# Patient Record
Sex: Male | Born: 1945
Health system: Southern US, Community
[De-identification: ages and names within clinical notes are randomized; demographics above are authoritative.]

## PROBLEM LIST (undated history)

## (undated) DIAGNOSIS — I10 Essential (primary) hypertension: Secondary | ICD-10-CM

## (undated) DIAGNOSIS — R0600 Dyspnea, unspecified: Secondary | ICD-10-CM

## (undated) DIAGNOSIS — N183 Chronic kidney disease, stage 3 unspecified: Secondary | ICD-10-CM

## (undated) DIAGNOSIS — E7800 Pure hypercholesterolemia, unspecified: Secondary | ICD-10-CM

## (undated) DIAGNOSIS — E349 Endocrine disorder, unspecified: Secondary | ICD-10-CM

## (undated) DIAGNOSIS — M109 Gout, unspecified: Secondary | ICD-10-CM

## (undated) DIAGNOSIS — G63 Polyneuropathy in diseases classified elsewhere: Secondary | ICD-10-CM

---

## 2012-01-25 ENCOUNTER — Emergency Department (INDEPENDENT_AMBULATORY_CARE_PROVIDER_SITE_OTHER)
Admission: EM | Admit: 2012-01-25 | Discharge: 2012-01-25 | Disposition: A | Discharge status: Home / Self Care | Payer: Self-pay | Source: Home / Self Care | Attending: Family Medicine | Admitting: Family Medicine

## 2012-01-25 ENCOUNTER — Encounter (HOSPITAL_COMMUNITY): Payer: Self-pay | Admitting: Emergency Medicine

## 2012-01-25 DIAGNOSIS — M109 Gout, unspecified: Secondary | ICD-10-CM

## 2012-01-25 HISTORY — DX: Gout, unspecified: M10.9

## 2012-01-25 HISTORY — DX: Essential (primary) hypertension: I10

## 2012-01-25 LAB — POCT I-STAT, CHEM 8
BUN: 32 mg/dL — ABNORMAL HIGH (ref 6–23)
Calcium, Ion: 1.21 mmol/L (ref 1.13–1.30)
Chloride: 104 mEq/L (ref 96–112)
Potassium: 4.9 mEq/L (ref 3.5–5.1)

## 2012-01-25 LAB — URIC ACID: Uric Acid, Serum: 7.6 mg/dL (ref 4.0–7.8)

## 2012-01-25 MED ORDER — COLCHICINE 0.6 MG PO TABS: 0.6000 mg | ORAL_TABLET | Freq: Two times a day (BID) | ORAL | Status: DC

## 2012-01-25 MED ORDER — DICLOFENAC POTASSIUM 50 MG PO TABS: 50.0000 mg | ORAL_TABLET | Freq: Three times a day (TID) | ORAL | Status: AC

## 2012-01-25 NOTE — ED Provider Notes (Signed)
History     CSN: [id number]  Arrival date & time 01/25/12  0900   First MD Initiated Contact with Patient 01/25/12 [phone]      Chief Complaint  Patient presents with  . Hand Pain    (Consider location/radiation/quality/duration/timing/severity/associated sxs/prior treatment) Patient is a 66 y.o. male presenting with hand pain. The history is provided by the patient.  Hand Pain This is a new problem. The current episode started more than 1 week ago. The problem occurs constantly. The problem has been gradually worsening. The symptoms are aggravated by bending.    Past Medical History  Diagnosis Date  . Diabetes mellitus   . Hypertension   . Gout     No past surgical history on file.  No family history on file.  History  Substance Use Topics  . Smoking status: Not on file  . Smokeless tobacco: Not on file  . Alcohol Use:       Review of Systems  Constitutional: Negative.   Musculoskeletal: Positive for joint swelling.    Allergies  Review of patient's allergies indicates no known allergies.  Home Medications   Current Outpatient Rx  Name Route Sig Dispense Refill  . AMLODIPINE BESYLATE 10 MG PO TABS Oral Take 10 mg by mouth daily.    . ASPIRIN 81 MG PO TABS Oral Take 81 mg by mouth daily.    . ATENOLOL 100 MG PO TABS Oral Take 100 mg by mouth daily.    Marland Kitchen GLIPIZIDE 5 MG PO TABS Oral Take 5 mg by mouth 2 (two) times daily before a meal.    . HYDROCHLOROTHIAZIDE 25 MG PO TABS Oral Take 25 mg by mouth daily.    . INSULIN GLARGINE 100 UNIT/ML [place] SOLN Subcutaneous Inject into the skin at bedtime.    . COLCHICINE 0.6 MG PO TABS Oral Take 1 tablet (0.6 mg total) by mouth 2 (two) times daily. 30 tablet 1  . DICLOFENAC POTASSIUM 50 MG PO TABS Oral Take 1 tablet (50 mg total) by mouth 3 (three) times daily. 30 tablet 0    BP 163/94  Pulse 73  Temp 99.3 F (37.4 C) (Oral)  Resp 16  SpO2 95%  Physical Exam  Nursing note and vitals reviewed. Constitutional: He  appears well-developed and well-nourished.  Musculoskeletal: He exhibits edema and tenderness.       Left hand: He exhibits decreased range of motion, tenderness and bony tenderness.       Hands: Skin: Skin is warm and dry.    ED Course  Procedures (including critical care time)   Labs Reviewed  URIC ACID   No results found.   1. Gout attack       MDM          Billy Fischer, MD 01/25/12 [phone]

## 2012-01-25 NOTE — ED Notes (Signed)
C/o of pain and swelling to lt hand since last Wednesday. Denies injury

## 2012-02-04 ENCOUNTER — Telehealth (HOSPITAL_COMMUNITY): Payer: Self-pay | Admitting: *Deleted

## 2012-02-04 NOTE — ED Notes (Signed)
Uric Acid 7.6 WNL  Pt. treated with Colchicine and Diclofenac for gout. I called and left a message to call. Roselyn Meier 02/04/2012

## 2012-02-10 ENCOUNTER — Telehealth (HOSPITAL_COMMUNITY): Payer: Self-pay | Admitting: *Deleted

## 2012-02-10 NOTE — ED Notes (Unsigned)
I called pt.  Pt. verified x 2 and given results. Pt. told to tell his PCP at his next visit in 30 days. Pt. said medication helped his symptoms.

## 2012-07-07 ENCOUNTER — Emergency Department (HOSPITAL_COMMUNITY)
Admission: EM | Admit: 2012-07-07 | Discharge: 2012-07-07 | Disposition: A | Discharge status: Home / Self Care | Payer: Self-pay | Attending: Emergency Medicine | Admitting: Emergency Medicine

## 2012-07-07 ENCOUNTER — Encounter (HOSPITAL_COMMUNITY): Payer: Self-pay | Admitting: Emergency Medicine

## 2012-07-07 DIAGNOSIS — N183 Chronic kidney disease, stage 3 unspecified: Secondary | ICD-10-CM | POA: Insufficient documentation

## 2012-07-07 DIAGNOSIS — S058X9A Other injuries of unspecified eye and orbit, initial encounter: Secondary | ICD-10-CM | POA: Insufficient documentation

## 2012-07-07 DIAGNOSIS — Z794 Long term (current) use of insulin: Secondary | ICD-10-CM | POA: Insufficient documentation

## 2012-07-07 DIAGNOSIS — Z79899 Other long term (current) drug therapy: Secondary | ICD-10-CM | POA: Insufficient documentation

## 2012-07-07 DIAGNOSIS — Y9389 Activity, other specified: Secondary | ICD-10-CM | POA: Insufficient documentation

## 2012-07-07 DIAGNOSIS — Z8639 Personal history of other endocrine, nutritional and metabolic disease: Secondary | ICD-10-CM | POA: Insufficient documentation

## 2012-07-07 DIAGNOSIS — E119 Type 2 diabetes mellitus without complications: Secondary | ICD-10-CM | POA: Insufficient documentation

## 2012-07-07 DIAGNOSIS — X58XXXA Exposure to other specified factors, initial encounter: Secondary | ICD-10-CM | POA: Insufficient documentation

## 2012-07-07 DIAGNOSIS — H5789 Other specified disorders of eye and adnexa: Secondary | ICD-10-CM | POA: Insufficient documentation

## 2012-07-07 DIAGNOSIS — S0502XA Injury of conjunctiva and corneal abrasion without foreign body, left eye, initial encounter: Secondary | ICD-10-CM

## 2012-07-07 DIAGNOSIS — M109 Gout, unspecified: Secondary | ICD-10-CM | POA: Insufficient documentation

## 2012-07-07 DIAGNOSIS — Y929 Unspecified place or not applicable: Secondary | ICD-10-CM | POA: Insufficient documentation

## 2012-07-07 DIAGNOSIS — Z7982 Long term (current) use of aspirin: Secondary | ICD-10-CM | POA: Insufficient documentation

## 2012-07-07 DIAGNOSIS — Z862 Personal history of diseases of the blood and blood-forming organs and certain disorders involving the immune mechanism: Secondary | ICD-10-CM | POA: Insufficient documentation

## 2012-07-07 DIAGNOSIS — I129 Hypertensive chronic kidney disease with stage 1 through stage 4 chronic kidney disease, or unspecified chronic kidney disease: Secondary | ICD-10-CM | POA: Insufficient documentation

## 2012-07-07 HISTORY — DX: Chronic kidney disease, stage 3 (moderate): N18.3

## 2012-07-07 HISTORY — DX: Pure hypercholesterolemia, unspecified: E78.00

## 2012-07-07 HISTORY — DX: Chronic kidney disease, stage 3 unspecified: N18.30

## 2012-07-07 MED ORDER — FLUORESCEIN SODIUM 1 MG OP STRP
1.0000 | ORAL_STRIP | Freq: Once | OPHTHALMIC | Status: AC
  Administered 2012-07-07: 1 via OPHTHALMIC
  Filled 2012-07-07: qty 1

## 2012-07-07 MED ORDER — TETRACAINE HCL 0.5 % OP SOLN
1.0000 [drp] | Freq: Once | OPHTHALMIC | Status: AC
  Administered 2012-07-07: 1 [drp] via OPHTHALMIC
  Filled 2012-07-07: qty 2

## 2012-07-07 MED ORDER — OXYCODONE-ACETAMINOPHEN 5-325 MG PO TABS: 1.0000 | ORAL_TABLET | ORAL | Status: DC | PRN

## 2012-07-07 MED ORDER — CIPROFLOXACIN HCL 0.3 % OP SOLN
1.0000 [drp] | Freq: Once | OPHTHALMIC | Status: AC
  Administered 2012-07-07: 1 [drp] via OPHTHALMIC
  Filled 2012-07-07: qty 2.5

## 2012-07-07 MED ORDER — CIPROFLOXACIN HCL 0.3 % OP SOLN
1.0000 [drp] | Freq: Once | OPHTHALMIC | Status: DC
  Filled 2012-07-07: qty 2.5

## 2012-07-07 NOTE — ED Notes (Signed)
Pt is c/o irritation to his left eye  Pt states it feels like something is in it and his vision is blurred  Pt states sxs started last night

## 2012-07-07 NOTE — ED Provider Notes (Signed)
History     CSN: [id number]  Arrival date & time 07/07/12  2215   First MD Initiated Contact with Patient 07/07/12 2241      Chief Complaint  Patient presents with  . Eye Problem    (Consider location/radiation/quality/duration/timing/severity/associated sxs/prior treatment) HPI Comments: Pt had onset of pain in the L eye last night - gradual in onset, gradually getting worse, associated with mild swelling and redness of the eye and slight blurred vision on the L.  Feels as though something is in the eye.  Has had some rubbing on the eye today.  Does not wear contact lenses.  No f/c/n/v  Patient is a 67 y.o. male presenting with eye problem. The history is provided by the patient.  Eye Problem Associated symptoms: redness   Associated symptoms: no discharge, no headaches, no nausea and no vomiting     Past Medical History  Diagnosis Date  . Diabetes mellitus   . Hypertension   . Gout   . Hypercholesteremia   . Chronic kidney disease (CKD), stage III (moderate)     History reviewed. No pertinent past surgical history.  Family History  Problem Relation Age of Onset  . Diabetes Other   . Hypertension Other     History  Substance Use Topics  . Smoking status: Never Smoker   . Smokeless tobacco: Not on file  . Alcohol Use: No      Review of Systems  Constitutional: Negative for fever.  HENT: Negative for ear pain, congestion and rhinorrhea.   Eyes: Positive for pain and redness. Negative for discharge.  Respiratory: Negative for cough.   Gastrointestinal: Negative for nausea and vomiting.  Skin: Negative for rash.  Neurological: Negative for headaches.    Allergies  Review of patient's allergies indicates no known allergies.  Home Medications   Current Outpatient Rx  Name  Route  Sig  Dispense  Refill  . amLODipine (NORVASC) 10 MG tablet   Oral   Take 10 mg by mouth daily.         Marland Kitchen aspirin 81 MG tablet   Oral   Take 81 mg by mouth daily.         Marland Kitchen atenolol (TENORMIN) 100 MG tablet   Oral   Take 100 mg by mouth daily.         . colchicine 0.6 MG tablet   Oral   Take 1 tablet (0.6 mg total) by mouth 2 (two) times daily.   30 tablet   1   . diclofenac (CATAFLAM) 50 MG tablet   Oral   Take 1 tablet (50 mg total) by mouth 3 (three) times daily.   30 tablet   0   . glipiZIDE (GLUCOTROL) 5 MG tablet   Oral   Take 5 mg by mouth 2 (two) times daily before a meal.         . hydrochlorothiazide (HYDRODIURIL) 25 MG tablet   Oral   Take 25 mg by mouth daily.         . insulin glargine (LANTUS) 100 UNIT/ML injection   Subcutaneous   Inject into the skin at bedtime.         Marland Kitchen oxyCODONE-acetaminophen (PERCOCET) 5-325 MG per tablet   Oral   Take 1 tablet by mouth every 4 (four) hours as needed for pain.   20 tablet   0     BP 148/78  Pulse 69  Temp(Src) 98.7 F (37.1 C) (Oral)  Resp 18  SpO2  98%  Physical Exam  Constitutional: He appears well-developed and well-nourished. No distress.  HENT:  Head: Normocephalic and atraumatic.  Eyes: EOM are normal. Pupils are equal, round, and reactive to light.  L: conj with mild injection, normal pupils bilaterally, L lid with mild swelling, no erythema, clear tears, no purulent d/c.  Has large left corneal abrasion, no seidel sign seen  Pulmonary/Chest: Effort normal.  Neurological: He is alert. Coordination normal.  Skin: Skin is warm and dry. No rash noted. He is not diaphoretic.    ED Course  Procedures (including critical care time)  Labs Reviewed - No data to display No results found.   1. Corneal abrasion, left       MDM  Pt has corneal abrasion, pain meds, topical abx drops given, optho f/u.  Percocet ciloxan        Johnna Acosta, MD 07/07/12 2259

## 2012-12-07 DIAGNOSIS — Z23 Encounter for immunization: Secondary | ICD-10-CM | POA: Diagnosis not present

## 2012-12-07 DIAGNOSIS — I10 Essential (primary) hypertension: Secondary | ICD-10-CM | POA: Diagnosis not present

## 2012-12-07 DIAGNOSIS — E7800 Pure hypercholesterolemia, unspecified: Secondary | ICD-10-CM | POA: Diagnosis not present

## 2012-12-07 DIAGNOSIS — E1129 Type 2 diabetes mellitus with other diabetic kidney complication: Secondary | ICD-10-CM | POA: Diagnosis not present

## 2012-12-07 DIAGNOSIS — R748 Abnormal levels of other serum enzymes: Secondary | ICD-10-CM | POA: Diagnosis not present

## 2013-07-24 DIAGNOSIS — I10 Essential (primary) hypertension: Secondary | ICD-10-CM | POA: Diagnosis not present

## 2013-07-24 DIAGNOSIS — E1165 Type 2 diabetes mellitus with hyperglycemia: Secondary | ICD-10-CM | POA: Diagnosis not present

## 2013-07-24 DIAGNOSIS — N183 Chronic kidney disease, stage 3 unspecified: Secondary | ICD-10-CM | POA: Diagnosis not present

## 2013-07-24 DIAGNOSIS — E1129 Type 2 diabetes mellitus with other diabetic kidney complication: Secondary | ICD-10-CM | POA: Diagnosis not present

## 2013-07-24 DIAGNOSIS — E7800 Pure hypercholesterolemia, unspecified: Secondary | ICD-10-CM | POA: Diagnosis not present

## 2014-04-16 DIAGNOSIS — E1121 Type 2 diabetes mellitus with diabetic nephropathy: Secondary | ICD-10-CM | POA: Diagnosis not present

## 2014-04-16 DIAGNOSIS — Z79899 Other long term (current) drug therapy: Secondary | ICD-10-CM | POA: Diagnosis not present

## 2014-04-16 DIAGNOSIS — I10 Essential (primary) hypertension: Secondary | ICD-10-CM | POA: Diagnosis not present

## 2014-04-16 DIAGNOSIS — Z23 Encounter for immunization: Secondary | ICD-10-CM | POA: Diagnosis not present

## 2014-04-16 DIAGNOSIS — Z125 Encounter for screening for malignant neoplasm of prostate: Secondary | ICD-10-CM | POA: Diagnosis not present

## 2014-04-16 DIAGNOSIS — N183 Chronic kidney disease, stage 3 (moderate): Secondary | ICD-10-CM | POA: Diagnosis not present

## 2014-04-16 DIAGNOSIS — E780 Pure hypercholesterolemia: Secondary | ICD-10-CM | POA: Diagnosis not present

## 2015-01-08 DIAGNOSIS — Z79899 Other long term (current) drug therapy: Secondary | ICD-10-CM | POA: Diagnosis not present

## 2015-01-08 DIAGNOSIS — D649 Anemia, unspecified: Secondary | ICD-10-CM | POA: Diagnosis not present

## 2015-01-08 DIAGNOSIS — Z1211 Encounter for screening for malignant neoplasm of colon: Secondary | ICD-10-CM | POA: Diagnosis not present

## 2015-01-08 DIAGNOSIS — E1121 Type 2 diabetes mellitus with diabetic nephropathy: Secondary | ICD-10-CM | POA: Diagnosis not present

## 2015-01-08 DIAGNOSIS — N183 Chronic kidney disease, stage 3 (moderate): Secondary | ICD-10-CM | POA: Diagnosis not present

## 2015-01-08 DIAGNOSIS — E785 Hyperlipidemia, unspecified: Secondary | ICD-10-CM | POA: Diagnosis not present

## 2015-01-08 DIAGNOSIS — Z794 Long term (current) use of insulin: Secondary | ICD-10-CM | POA: Diagnosis not present

## 2015-01-08 DIAGNOSIS — I10 Essential (primary) hypertension: Secondary | ICD-10-CM | POA: Diagnosis not present

## 2015-01-08 DIAGNOSIS — E780 Pure hypercholesterolemia: Secondary | ICD-10-CM | POA: Diagnosis not present

## 2015-08-22 DIAGNOSIS — E1121 Type 2 diabetes mellitus with diabetic nephropathy: Secondary | ICD-10-CM | POA: Diagnosis not present

## 2015-08-22 DIAGNOSIS — N183 Chronic kidney disease, stage 3 (moderate): Secondary | ICD-10-CM | POA: Diagnosis not present

## 2015-08-22 DIAGNOSIS — I10 Essential (primary) hypertension: Secondary | ICD-10-CM | POA: Diagnosis not present

## 2015-08-22 DIAGNOSIS — Z794 Long term (current) use of insulin: Secondary | ICD-10-CM | POA: Diagnosis not present

## 2015-08-22 DIAGNOSIS — Z1211 Encounter for screening for malignant neoplasm of colon: Secondary | ICD-10-CM | POA: Diagnosis not present

## 2016-01-17 DIAGNOSIS — E119 Type 2 diabetes mellitus without complications: Secondary | ICD-10-CM | POA: Diagnosis not present

## 2016-01-17 DIAGNOSIS — I10 Essential (primary) hypertension: Secondary | ICD-10-CM | POA: Diagnosis not present

## 2016-01-17 DIAGNOSIS — E785 Hyperlipidemia, unspecified: Secondary | ICD-10-CM | POA: Diagnosis not present

## 2016-01-17 DIAGNOSIS — Z6836 Body mass index (BMI) 36.0-36.9, adult: Secondary | ICD-10-CM | POA: Diagnosis not present

## 2016-01-17 DIAGNOSIS — N183 Chronic kidney disease, stage 3 (moderate): Secondary | ICD-10-CM | POA: Diagnosis not present

## 2016-01-24 ENCOUNTER — Other Ambulatory Visit: Payer: Self-pay | Admitting: Nephrology

## 2016-01-24 DIAGNOSIS — N183 Chronic kidney disease, stage 3 (moderate): Secondary | ICD-10-CM

## 2016-02-04 ENCOUNTER — Inpatient Hospital Stay: Admission: RE | Admit: 2016-02-04 | Payer: Self-pay | Source: Ambulatory Visit

## 2016-02-25 ENCOUNTER — Ambulatory Visit
Admission: RE | Admit: 2016-02-25 | Discharge: 2016-02-25 | Disposition: A | Discharge status: Home / Self Care | Payer: Medicare Other | Source: Ambulatory Visit | Attending: Nephrology | Admitting: Nephrology

## 2016-02-25 DIAGNOSIS — N183 Chronic kidney disease, stage 3 unspecified: Secondary | ICD-10-CM

## 2016-02-25 DIAGNOSIS — N189 Chronic kidney disease, unspecified: Secondary | ICD-10-CM | POA: Diagnosis not present

## 2016-05-12 DIAGNOSIS — N289 Disorder of kidney and ureter, unspecified: Secondary | ICD-10-CM | POA: Diagnosis not present

## 2016-05-12 DIAGNOSIS — E119 Type 2 diabetes mellitus without complications: Secondary | ICD-10-CM | POA: Diagnosis not present

## 2016-05-12 DIAGNOSIS — I129 Hypertensive chronic kidney disease with stage 1 through stage 4 chronic kidney disease, or unspecified chronic kidney disease: Secondary | ICD-10-CM | POA: Diagnosis not present

## 2016-05-12 DIAGNOSIS — Z6836 Body mass index (BMI) 36.0-36.9, adult: Secondary | ICD-10-CM | POA: Diagnosis not present

## 2016-05-12 DIAGNOSIS — N183 Chronic kidney disease, stage 3 (moderate): Secondary | ICD-10-CM | POA: Diagnosis not present

## 2016-05-12 DIAGNOSIS — E785 Hyperlipidemia, unspecified: Secondary | ICD-10-CM | POA: Diagnosis not present

## 2016-06-22 DIAGNOSIS — E785 Hyperlipidemia, unspecified: Secondary | ICD-10-CM | POA: Diagnosis not present

## 2016-06-22 DIAGNOSIS — R600 Localized edema: Secondary | ICD-10-CM | POA: Diagnosis not present

## 2016-06-22 DIAGNOSIS — E119 Type 2 diabetes mellitus without complications: Secondary | ICD-10-CM | POA: Diagnosis not present

## 2016-06-22 DIAGNOSIS — I129 Hypertensive chronic kidney disease with stage 1 through stage 4 chronic kidney disease, or unspecified chronic kidney disease: Secondary | ICD-10-CM | POA: Diagnosis not present

## 2016-06-22 DIAGNOSIS — N183 Chronic kidney disease, stage 3 (moderate): Secondary | ICD-10-CM | POA: Diagnosis not present

## 2016-06-22 DIAGNOSIS — Z6836 Body mass index (BMI) 36.0-36.9, adult: Secondary | ICD-10-CM | POA: Diagnosis not present

## 2016-06-24 ENCOUNTER — Other Ambulatory Visit: Payer: Self-pay | Admitting: Nephrology

## 2016-06-24 DIAGNOSIS — R609 Edema, unspecified: Secondary | ICD-10-CM

## 2016-06-25 ENCOUNTER — Ambulatory Visit
Admission: RE | Admit: 2016-06-25 | Discharge: 2016-06-25 | Disposition: A | Discharge status: Home / Self Care | Payer: Medicare Other | Source: Ambulatory Visit | Attending: Nephrology | Admitting: Nephrology

## 2016-06-25 DIAGNOSIS — R600 Localized edema: Secondary | ICD-10-CM | POA: Diagnosis not present

## 2016-06-25 DIAGNOSIS — R609 Edema, unspecified: Secondary | ICD-10-CM

## 2016-07-27 DIAGNOSIS — E7800 Pure hypercholesterolemia, unspecified: Secondary | ICD-10-CM | POA: Diagnosis not present

## 2016-07-27 DIAGNOSIS — Z794 Long term (current) use of insulin: Secondary | ICD-10-CM | POA: Diagnosis not present

## 2016-07-27 DIAGNOSIS — E1121 Type 2 diabetes mellitus with diabetic nephropathy: Secondary | ICD-10-CM | POA: Diagnosis not present

## 2016-07-27 DIAGNOSIS — N183 Chronic kidney disease, stage 3 (moderate): Secondary | ICD-10-CM | POA: Diagnosis not present

## 2016-07-27 DIAGNOSIS — I10 Essential (primary) hypertension: Secondary | ICD-10-CM | POA: Diagnosis not present

## 2017-01-11 DIAGNOSIS — N183 Chronic kidney disease, stage 3 (moderate): Secondary | ICD-10-CM | POA: Diagnosis not present

## 2017-01-11 DIAGNOSIS — I129 Hypertensive chronic kidney disease with stage 1 through stage 4 chronic kidney disease, or unspecified chronic kidney disease: Secondary | ICD-10-CM | POA: Diagnosis not present

## 2017-01-11 DIAGNOSIS — R600 Localized edema: Secondary | ICD-10-CM | POA: Diagnosis not present

## 2017-01-11 DIAGNOSIS — E119 Type 2 diabetes mellitus without complications: Secondary | ICD-10-CM | POA: Diagnosis not present

## 2017-01-11 DIAGNOSIS — Z6836 Body mass index (BMI) 36.0-36.9, adult: Secondary | ICD-10-CM | POA: Diagnosis not present

## 2017-01-28 DIAGNOSIS — N183 Chronic kidney disease, stage 3 (moderate): Secondary | ICD-10-CM | POA: Diagnosis not present

## 2017-02-23 DIAGNOSIS — M79604 Pain in right leg: Secondary | ICD-10-CM | POA: Diagnosis not present

## 2017-02-23 DIAGNOSIS — I10 Essential (primary) hypertension: Secondary | ICD-10-CM | POA: Diagnosis not present

## 2017-02-23 DIAGNOSIS — N184 Chronic kidney disease, stage 4 (severe): Secondary | ICD-10-CM | POA: Diagnosis not present

## 2017-02-23 DIAGNOSIS — E1121 Type 2 diabetes mellitus with diabetic nephropathy: Secondary | ICD-10-CM | POA: Diagnosis not present

## 2017-04-16 IMAGING — US US EXTREM LOW VENOUS BILAT
1 series · 13 of 24 positions shown · non-contrast
Comparison: None.

CLINICAL DATA: Bilateral lower extremity edema.  Evaluate for DVT.



[Series 1: us extrem low venous bilat · 0.10mm/px · 13 of 52 slices shown]
[im 1/52]
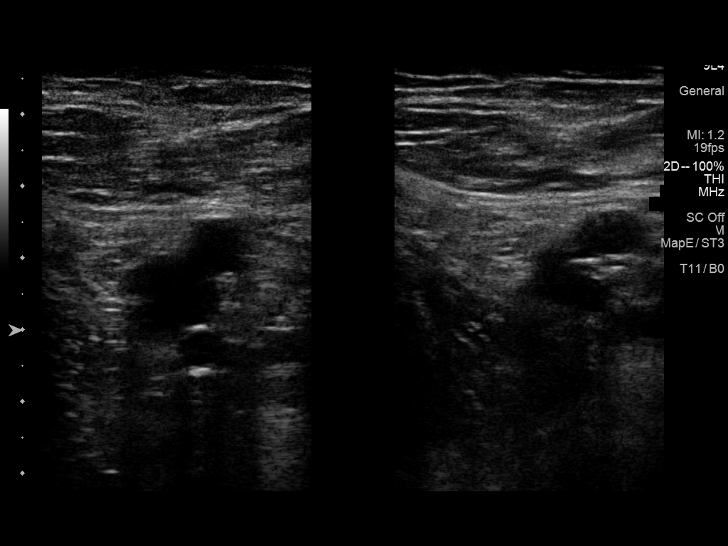
[im 5/52]
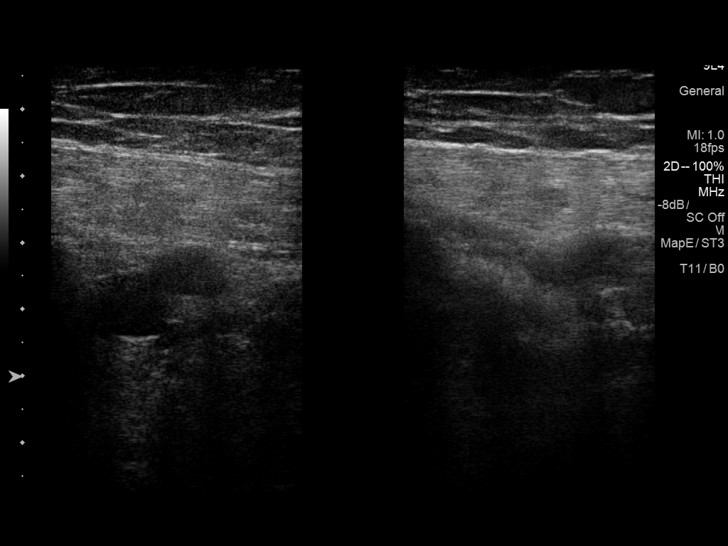
[im 9/52]
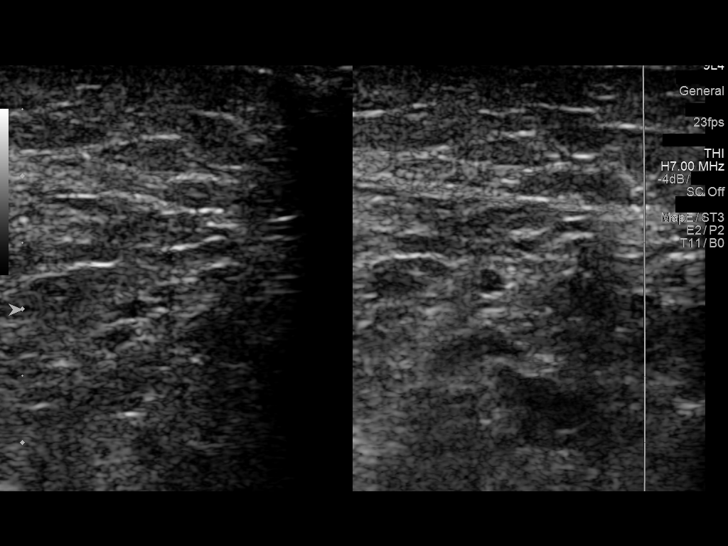
[im 14/52]
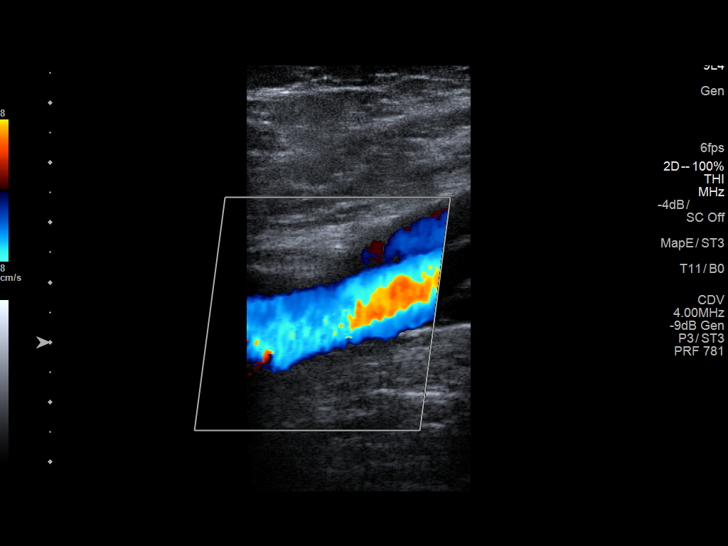
[im 18/52]
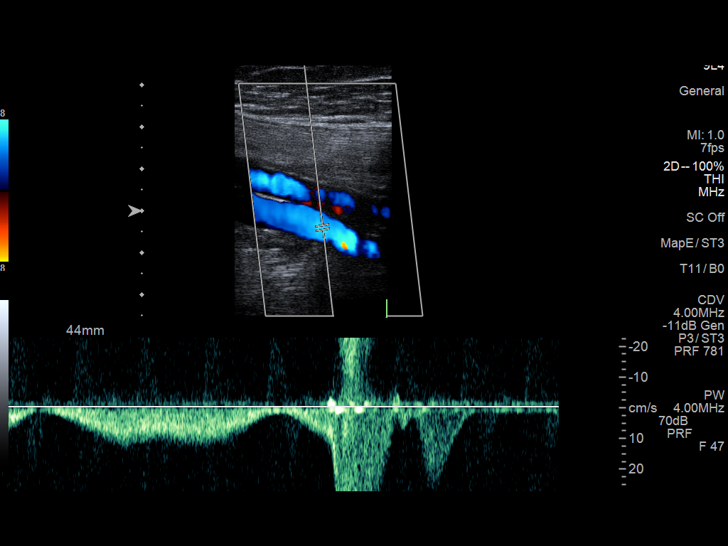
[im 23/52]
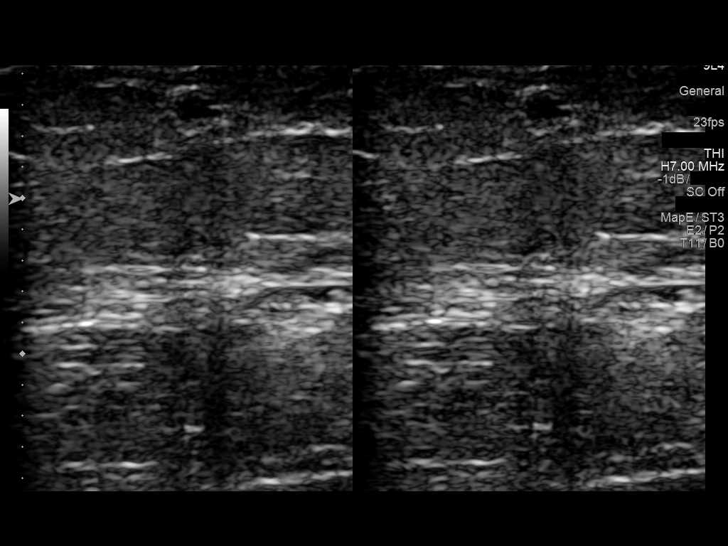
[im 27/52]
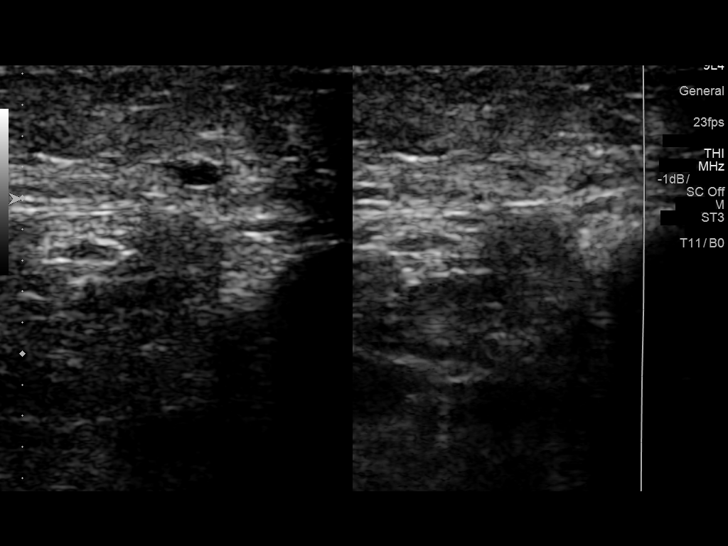
[im 29/52]
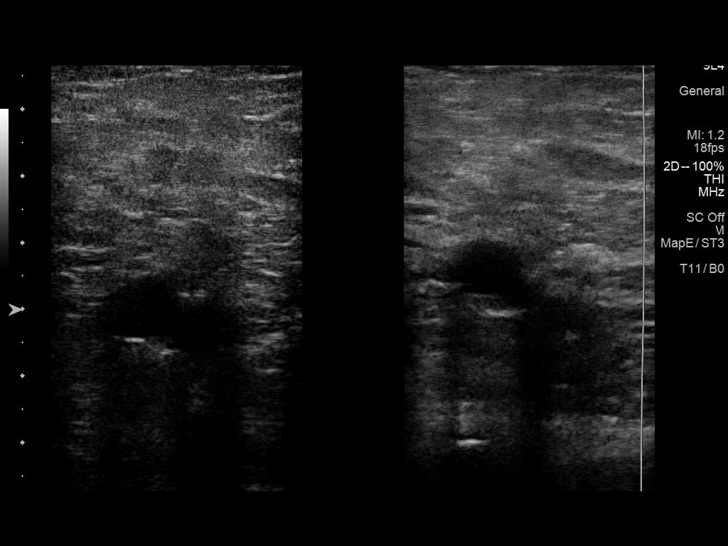
[im 34/52]
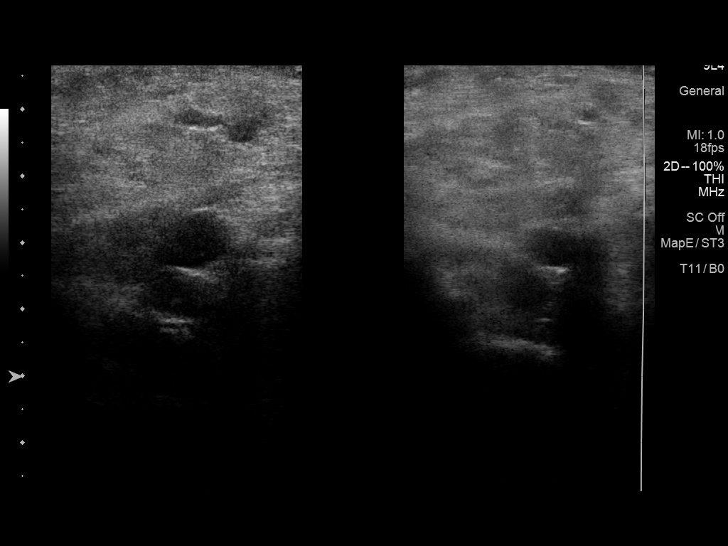
[im 38/52]
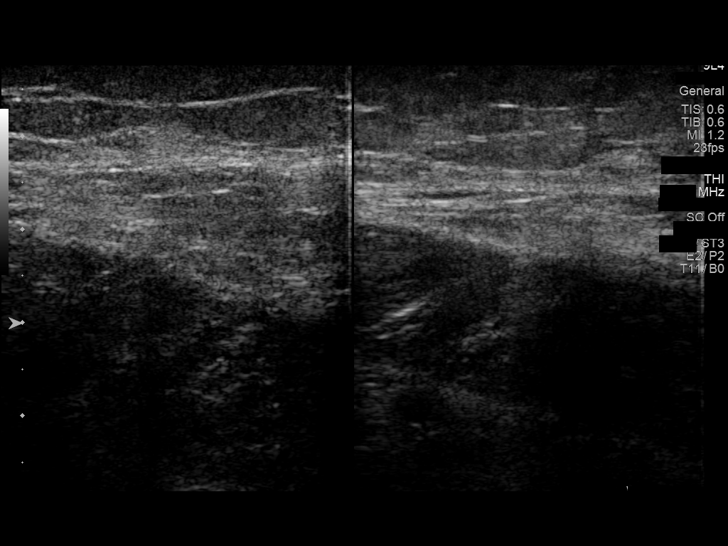
[im 43/52]
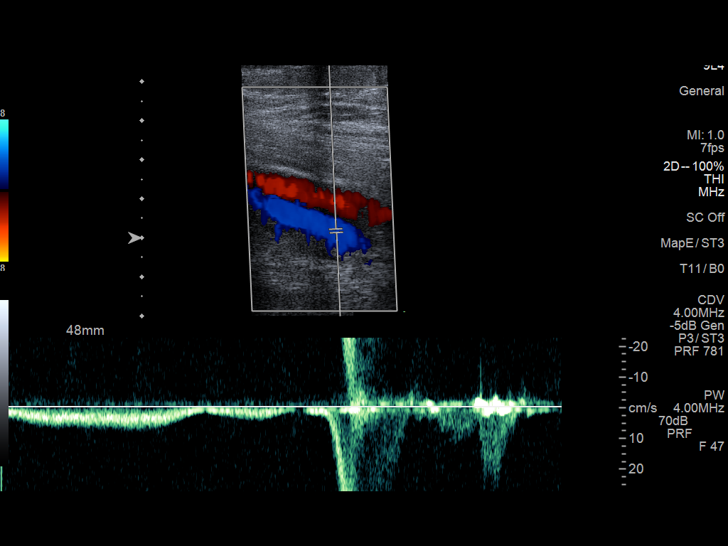
[im 47/52]
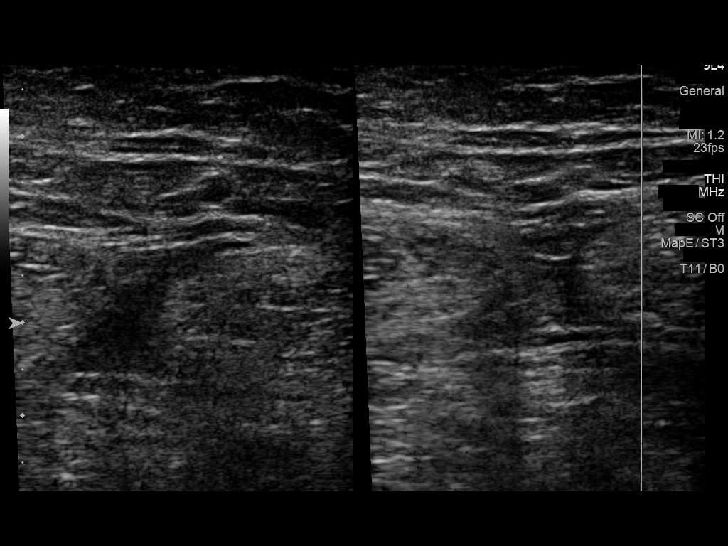
[im 52/52]
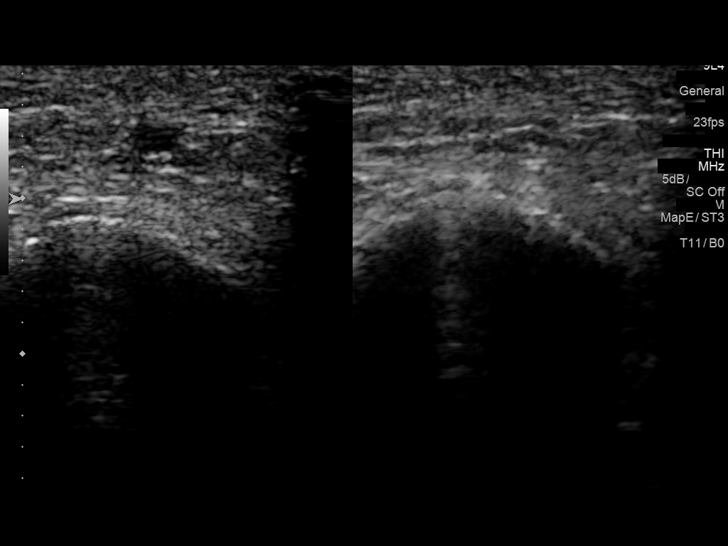

[13 of 24 positions shown; findings below may reference images not displayed]

FINDINGS: RIGHT LOWER EXTREMITY

Common Femoral Vein: No evidence of thrombus. Normal
compressibility, respiratory phasicity and response to augmentation.

Saphenofemoral Junction: No evidence of thrombus. Normal
compressibility and flow on color Doppler imaging.

Profunda Femoral Vein: No evidence of thrombus. Normal
compressibility and flow on color Doppler imaging.

Femoral Vein: No evidence of thrombus. Normal compressibility,
respiratory phasicity and response to augmentation.

Popliteal Vein: No evidence of thrombus. Normal compressibility,
respiratory phasicity and response to augmentation.

Calf Veins: No evidence of thrombus. Normal compressibility and flow
on color Doppler imaging.

Superficial Great Saphenous Vein: No evidence of thrombus. Normal
compressibility and flow on color Doppler imaging.

Venous Reflux:  None.

Other Findings:  None.

LEFT LOWER EXTREMITY

Common Femoral Vein: No evidence of thrombus. Normal
compressibility, respiratory phasicity and response to augmentation.

Saphenofemoral Junction: No evidence of thrombus. Normal
compressibility and flow on color Doppler imaging.

Profunda Femoral Vein: No evidence of thrombus. Normal
compressibility and flow on color Doppler imaging.

Femoral Vein: No evidence of thrombus. Normal compressibility,
respiratory phasicity and response to augmentation.

Popliteal Vein: No evidence of thrombus. Normal compressibility,
respiratory phasicity and response to augmentation.

Calf Veins: No evidence of thrombus. Normal compressibility and flow
on color Doppler imaging.

Superficial Great Saphenous Vein: No evidence of thrombus. Normal
compressibility and flow on color Doppler imaging.

Venous Reflux:  None.

Other Findings:  None.
IMPRESSION: No evidence of DVT within either lower extremity.

## 2017-06-02 DIAGNOSIS — Z23 Encounter for immunization: Secondary | ICD-10-CM | POA: Diagnosis not present

## 2017-06-02 DIAGNOSIS — D649 Anemia, unspecified: Secondary | ICD-10-CM | POA: Diagnosis not present

## 2017-06-02 DIAGNOSIS — N2581 Secondary hyperparathyroidism of renal origin: Secondary | ICD-10-CM | POA: Diagnosis not present

## 2017-06-02 DIAGNOSIS — R600 Localized edema: Secondary | ICD-10-CM | POA: Diagnosis not present

## 2017-06-02 DIAGNOSIS — N183 Chronic kidney disease, stage 3 (moderate): Secondary | ICD-10-CM | POA: Diagnosis not present

## 2017-06-02 DIAGNOSIS — E1122 Type 2 diabetes mellitus with diabetic chronic kidney disease: Secondary | ICD-10-CM | POA: Diagnosis not present

## 2017-06-08 ENCOUNTER — Other Ambulatory Visit: Payer: Self-pay

## 2017-06-08 DIAGNOSIS — R600 Localized edema: Secondary | ICD-10-CM

## 2017-07-16 ENCOUNTER — Encounter: Payer: Self-pay | Admitting: Vascular Surgery

## 2017-07-16 ENCOUNTER — Ambulatory Visit (INDEPENDENT_AMBULATORY_CARE_PROVIDER_SITE_OTHER): Payer: Medicare Other | Admitting: Vascular Surgery

## 2017-07-16 ENCOUNTER — Ambulatory Visit (HOSPITAL_COMMUNITY)
Admission: RE | Admit: 2017-07-16 | Discharge: 2017-07-16 | Disposition: A | Discharge status: Home / Self Care | Payer: Medicare Other | Source: Ambulatory Visit | Attending: Vascular Surgery | Admitting: Vascular Surgery

## 2017-07-16 VITALS — BP 159/85 | HR 69 | Resp 18 | Ht 72.0 in | Wt 277.0 lb

## 2017-07-16 DIAGNOSIS — R600 Localized edema: Secondary | ICD-10-CM

## 2017-07-16 DIAGNOSIS — I872 Venous insufficiency (chronic) (peripheral): Secondary | ICD-10-CM

## 2017-07-16 NOTE — Progress Notes (Signed)
Patient ID: Kelly Dunn, male   DOB: 1946/02/13, 72 y.o.   MRN: [id number]  Reason for Consult: New Patient (Initial Visit) (eval BIL LE edema - ref by Dr. Hollie Salk)   Referred by Madelon Lips, Dunn  Subjective:     HPI:  Kelly Dunn is a 72 y.o. male with history of chronic kidney disease hypertension hypercholesterolemia and diabetes.  He now presents for bilateral lower extremity swelling right greater than left.  He has skin discoloration on the right side as well as some scarring but is never had a frank ulceration.  He is tried we compression stockings in the past but currently does not wear them.  He states the swelling is worse throughout the day.  He has not had any fevers or chills.  He does have some weakness in his legs associated and they do feel heavy.  He also has shortness of breath with activity and problems with dizziness.  He does take a diuretic and this does help some.  He has never had DVT or venous intervention in the past.  Past Medical History:  Diagnosis Date  . Chronic kidney disease (CKD), stage III (moderate) (HCC)   . Diabetes mellitus   . Gout   . Hypercholesteremia   . Hypertension    Family History  Problem Relation Age of Onset  . Diabetes Other   . Hypertension Other    History reviewed. No pertinent surgical history.  Short Social History:  Social History   Tobacco Use  . Smoking status: Former Research scientist (life sciences)  . Smokeless tobacco: Former Systems developer    Quit date: 07/16/1972  Substance Use Topics  . Alcohol use: No    No Known Allergies  Current Outpatient Medications  Medication Sig Dispense Refill  . amLODipine (NORVASC) 10 MG tablet Take 10 mg by mouth daily.    Marland Kitchen aspirin 81 MG tablet Take 81 mg by mouth daily.    Marland Kitchen atorvastatin (LIPITOR) 40 MG tablet Take 40 mg by mouth daily.    . carvedilol (COREG) 25 MG tablet Take 25 mg by mouth 2 (two) times daily with a meal.    . Cholecalciferol (VITAMIN D3) 5000 units CAPS Take by mouth.      . furosemide (LASIX) 40 MG tablet Take 40 mg by mouth daily.    . insulin glargine (LANTUS) 100 UNIT/ML injection Inject into the skin at bedtime.    Marland Kitchen atenolol (TENORMIN) 100 MG tablet Take 100 mg by mouth daily.    . colchicine 0.6 MG tablet Take 1 tablet (0.6 mg total) by mouth 2 (two) times daily. 30 tablet 1  . glipiZIDE (GLUCOTROL) 5 MG tablet Take 5 mg by mouth 2 (two) times daily before a meal.    . hydrochlorothiazide (HYDRODIURIL) 25 MG tablet Take 25 mg by mouth daily.    Marland Kitchen oxyCODONE-acetaminophen (PERCOCET) 5-325 MG per tablet Take 1 tablet by mouth every 4 (four) hours as needed for pain. (Patient not taking: Reported on 07/16/2017) 20 tablet 0   No current facility-administered medications for this visit.     Review of Systems  Constitutional:  Constitutional negative. HENT: HENT negative.  Eyes: Eyes negative.  Respiratory: Positive for shortness of breath.  Cardiovascular: Positive for dyspnea with exertion and leg swelling.  Musculoskeletal: Musculoskeletal negative.  Skin:       Discoloration on right Neurological: Positive for dizziness.  Hematologic: Hematologic/lymphatic negative.  Psychiatric: Psychiatric negative.        Objective:  Objective   Vitals:  07/16/17 1214 07/16/17 1217  BP: (!) 163/86 (!) 159/85  Pulse: 69   Resp: 18   SpO2: 96%   Weight: 277 lb (125.6 kg)   Height: 6' (1.829 m)    Body mass index is 37.57 kg/m.  Physical Exam  Constitutional: He is oriented to person, place, and time. He appears well-developed.  HENT:  Head: Normocephalic.  Eyes: Pupils are equal, round, and reactive to light.  Neck: Normal range of motion. Neck supple.  Cardiovascular: Normal rate.  Pulses:      Dorsalis pedis pulses are 2+ on the right side, and 2+ on the left side.  Pulmonary/Chest: Effort normal.  Abdominal: Soft. He exhibits no mass.  Musculoskeletal: He exhibits edema.  Neurological: He is alert and oriented to person, place, and time.   Skin:  Skin changes and hypertrophy consistent with c4 venous disease  Psychiatric: He has a normal mood and affect. His behavior is normal. Judgment and thought content normal.    Data: I have independently interpreted his bilateral lower extremity reflux study.  The right saphenofemoral junction 0.5 cm the left is 0.63 cm.  There is only reflux in the right small saphenous vein which is 0.69 cm.     Assessment/Plan:     72 year old male sent for bilateral lower extremity swelling right greater than left with C4 venous disease.  He does have significant small saphenous vein reflux on the right and it is quite large as well.  We will get him set up to get thigh-high compression stockings which I think are going to be very important.  I will also have him evaluated for possible intervention on his small saphenous vein though I think his swelling is multifactorial and I have counseled him that I am unsure how helpful this will be but certainly the small saphenous vein is large and incompetent.  He demonstrates good understanding we will get him set up for 83-month office visit.     Kelly Dunn [facility]

## 2017-08-26 DIAGNOSIS — Z794 Long term (current) use of insulin: Secondary | ICD-10-CM | POA: Diagnosis not present

## 2017-08-26 DIAGNOSIS — Z1211 Encounter for screening for malignant neoplasm of colon: Secondary | ICD-10-CM | POA: Diagnosis not present

## 2017-08-26 DIAGNOSIS — Z136 Encounter for screening for cardiovascular disorders: Secondary | ICD-10-CM | POA: Diagnosis not present

## 2017-08-26 DIAGNOSIS — Z79899 Other long term (current) drug therapy: Secondary | ICD-10-CM | POA: Diagnosis not present

## 2017-08-26 DIAGNOSIS — I10 Essential (primary) hypertension: Secondary | ICD-10-CM | POA: Diagnosis not present

## 2017-08-26 DIAGNOSIS — N183 Chronic kidney disease, stage 3 (moderate): Secondary | ICD-10-CM | POA: Diagnosis not present

## 2017-08-26 DIAGNOSIS — Z0001 Encounter for general adult medical examination with abnormal findings: Secondary | ICD-10-CM | POA: Diagnosis not present

## 2017-08-26 DIAGNOSIS — M109 Gout, unspecified: Secondary | ICD-10-CM | POA: Diagnosis not present

## 2017-08-26 DIAGNOSIS — Z125 Encounter for screening for malignant neoplasm of prostate: Secondary | ICD-10-CM | POA: Diagnosis not present

## 2017-08-26 DIAGNOSIS — E1121 Type 2 diabetes mellitus with diabetic nephropathy: Secondary | ICD-10-CM | POA: Diagnosis not present

## 2017-08-26 DIAGNOSIS — E7800 Pure hypercholesterolemia, unspecified: Secondary | ICD-10-CM | POA: Diagnosis not present

## 2017-10-17 IMAGING — US US RENAL
1 series · 14 of 25 positions shown · non-contrast
Comparison: No recent prior.

CLINICAL DATA: Chronic renal disease.

EXAM:
RENAL / URINARY TRACT ULTRASOUND COMPLETE

[Series 1: us renal · 0.30mm/px · 14 of 34 slices shown]
[im 1/34]
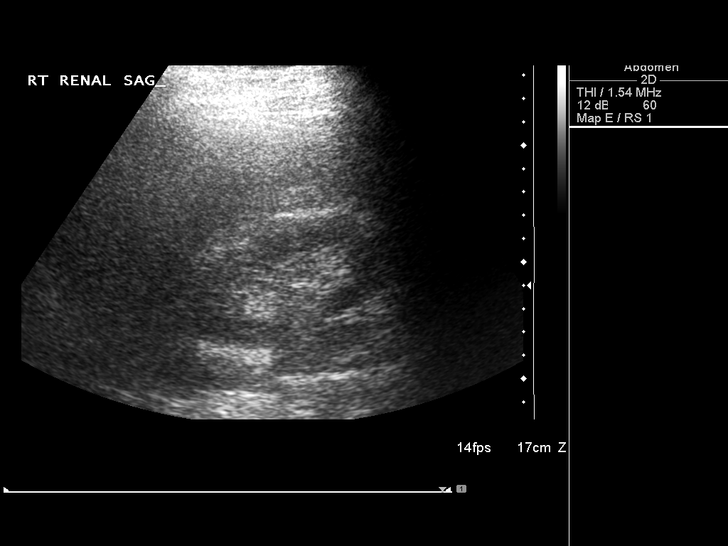
[im 3/34]
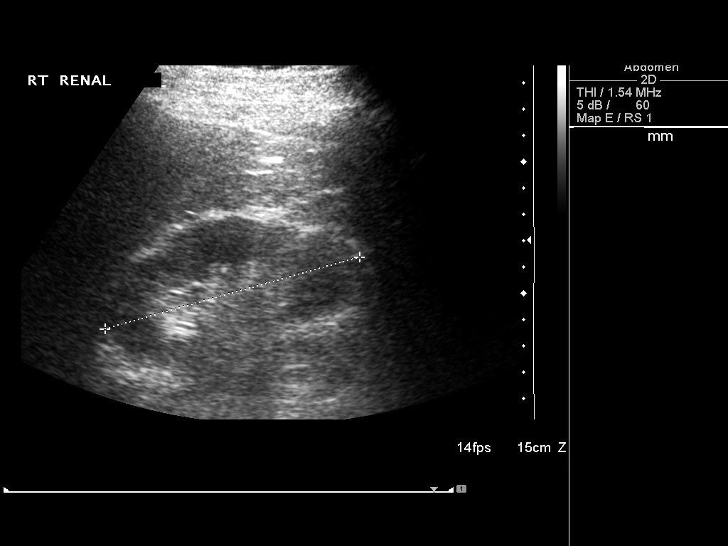
[im 6/34]
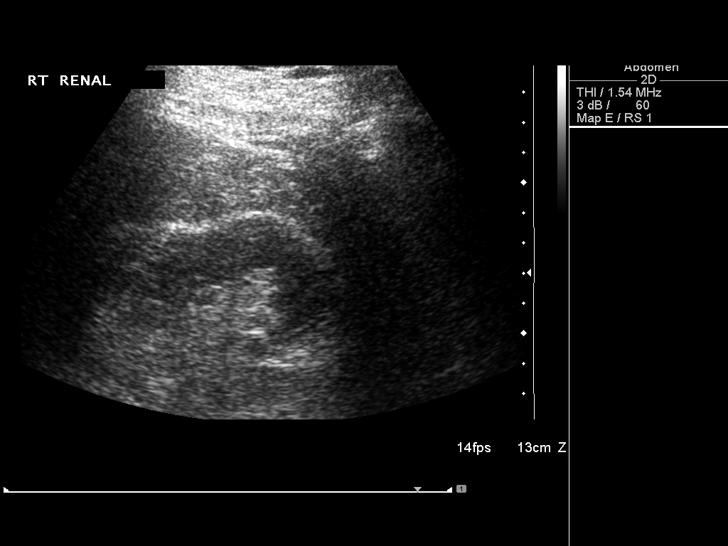
[im 9/34]
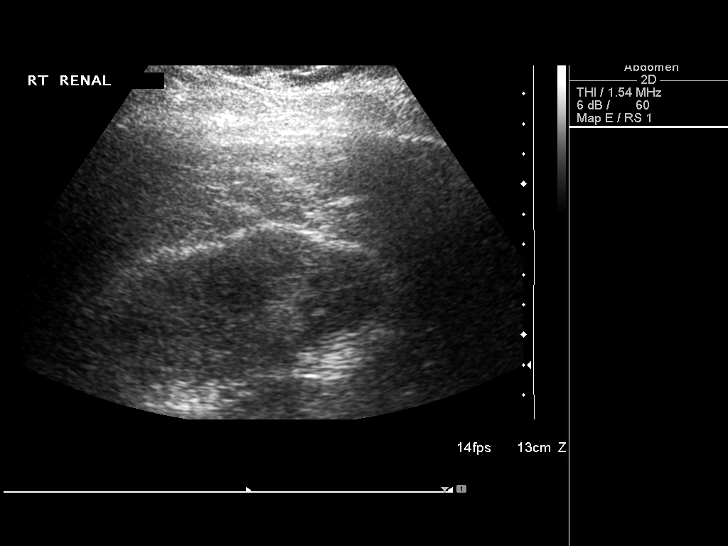
[im 12/34]
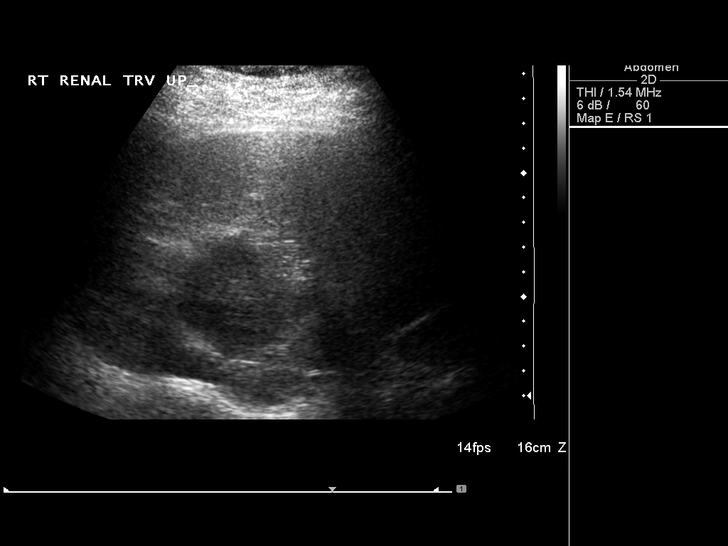
[im 13/34]
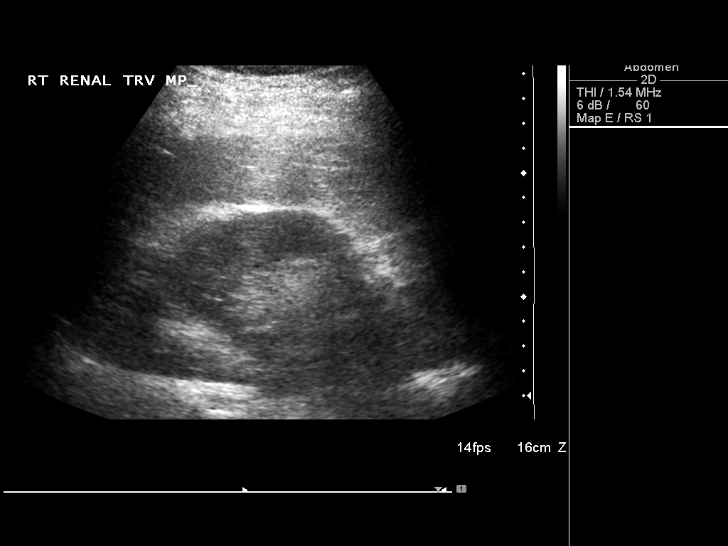
[im 16/34]
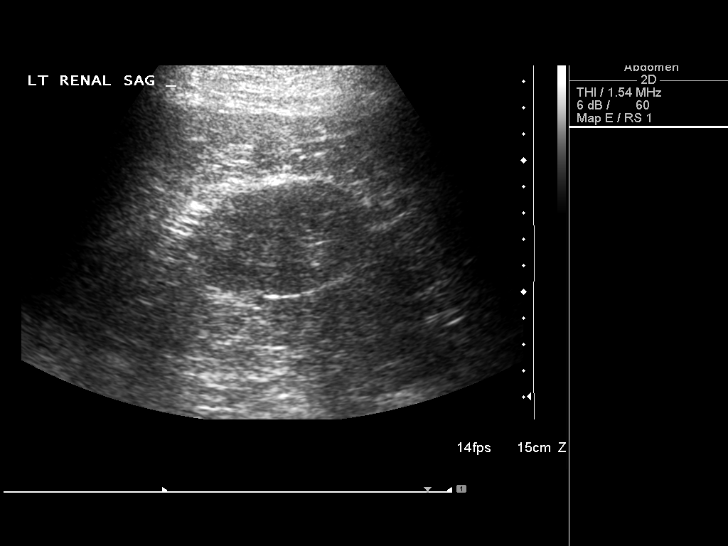
[im 18/34]
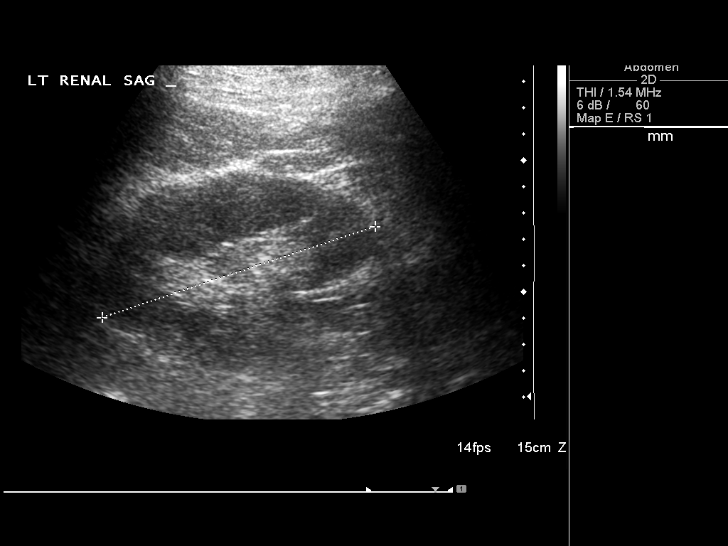
[im 21/34]
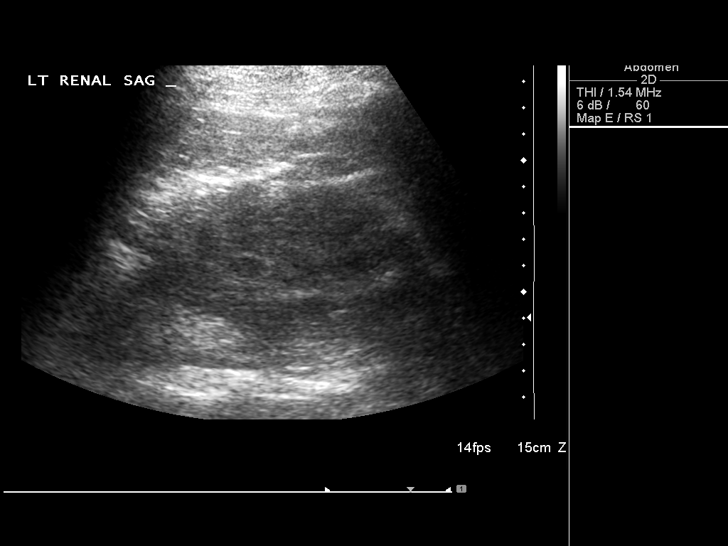
[im 23/34]
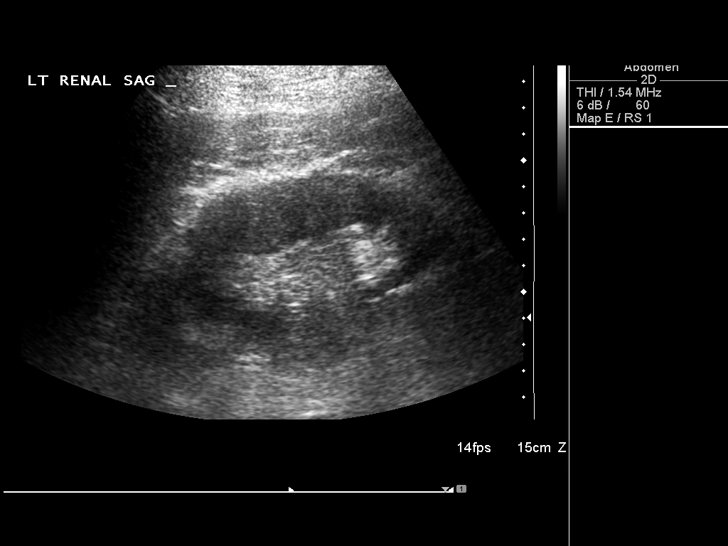
[im 25/34]
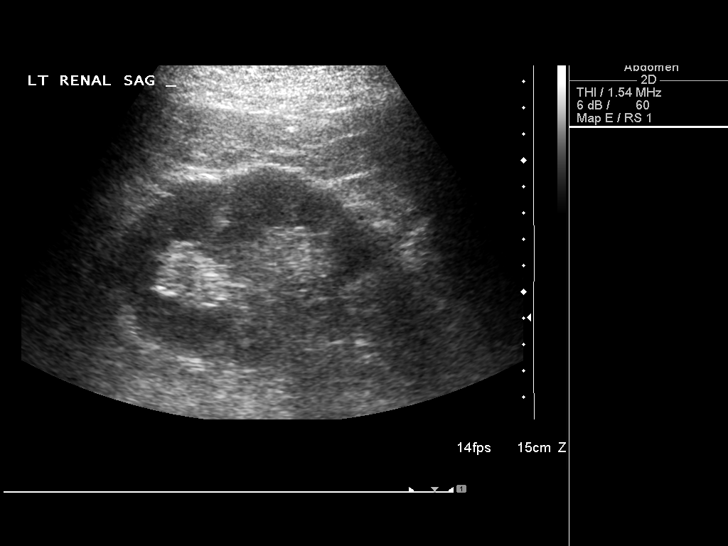
[im 28/34]
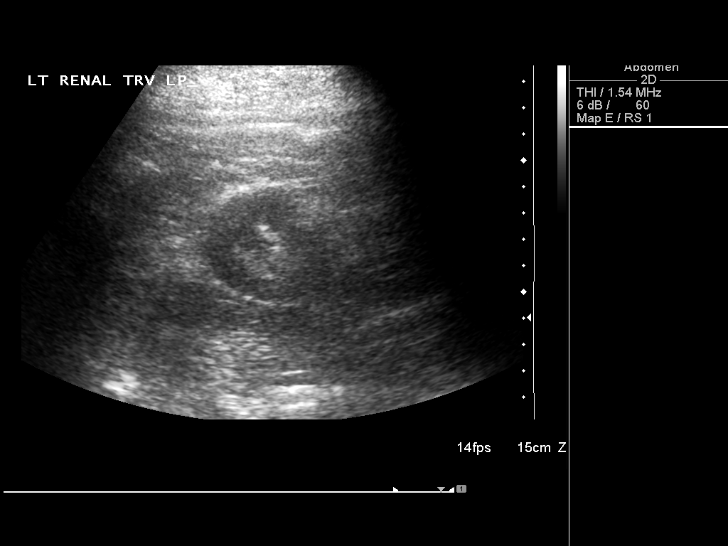
[im 31/34]
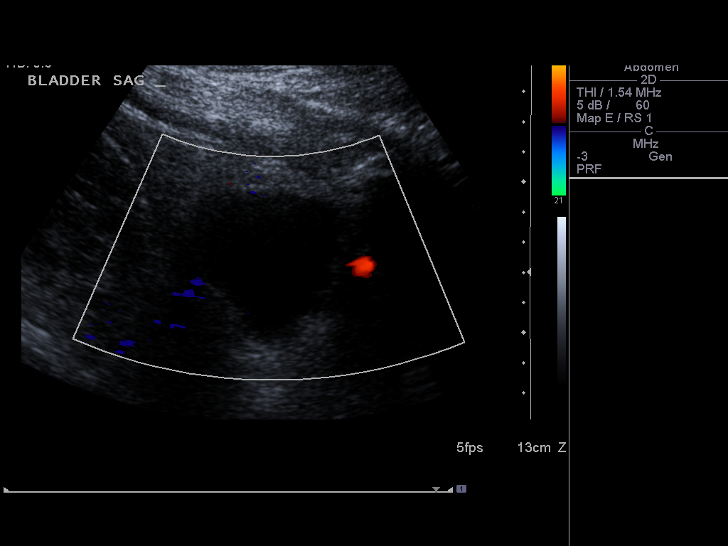
[im 34/34]
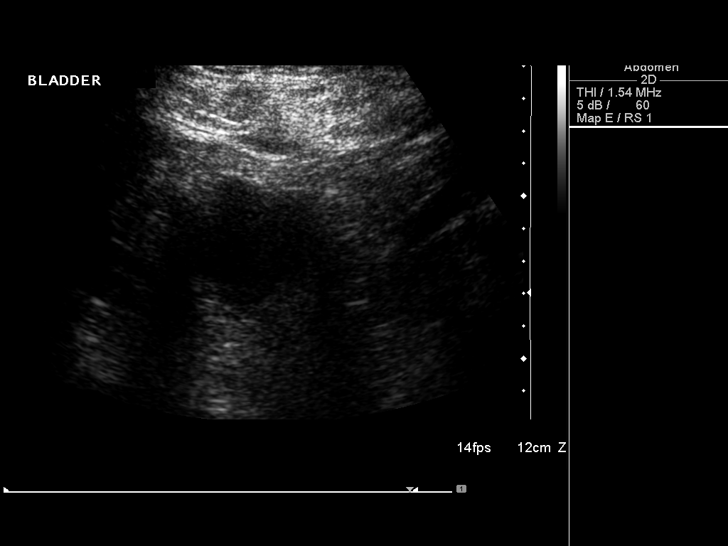

[14 of 25 positions shown; findings below may reference images not displayed]

FINDINGS: Right Kidney:

Length: 10.1 cm. Echogenicity within normal limits. No mass or
hydronephrosis visualized.

Left Kidney:

Length: 10.9 cm. Echogenicity within normal limits. No mass or
hydronephrosis visualized.

Bladder:

Appears normal for degree of bladder distention.
IMPRESSION: Negative exam.

## 2017-10-19 ENCOUNTER — Ambulatory Visit: Payer: Medicare Other | Admitting: Vascular Surgery

## 2017-10-29 DIAGNOSIS — N2581 Secondary hyperparathyroidism of renal origin: Secondary | ICD-10-CM | POA: Diagnosis not present

## 2017-10-29 DIAGNOSIS — D649 Anemia, unspecified: Secondary | ICD-10-CM | POA: Diagnosis not present

## 2017-10-29 DIAGNOSIS — N183 Chronic kidney disease, stage 3 (moderate): Secondary | ICD-10-CM | POA: Diagnosis not present

## 2017-10-29 DIAGNOSIS — E1122 Type 2 diabetes mellitus with diabetic chronic kidney disease: Secondary | ICD-10-CM | POA: Diagnosis not present

## 2017-10-29 DIAGNOSIS — I129 Hypertensive chronic kidney disease with stage 1 through stage 4 chronic kidney disease, or unspecified chronic kidney disease: Secondary | ICD-10-CM | POA: Diagnosis not present

## 2017-11-09 ENCOUNTER — Other Ambulatory Visit: Payer: Self-pay | Admitting: Family Medicine

## 2017-11-09 DIAGNOSIS — Z136 Encounter for screening for cardiovascular disorders: Secondary | ICD-10-CM

## 2017-11-09 DIAGNOSIS — Z87891 Personal history of nicotine dependence: Secondary | ICD-10-CM

## 2018-05-13 DIAGNOSIS — E785 Hyperlipidemia, unspecified: Secondary | ICD-10-CM | POA: Diagnosis not present

## 2018-05-13 DIAGNOSIS — E1122 Type 2 diabetes mellitus with diabetic chronic kidney disease: Secondary | ICD-10-CM | POA: Diagnosis not present

## 2018-05-13 DIAGNOSIS — D649 Anemia, unspecified: Secondary | ICD-10-CM | POA: Diagnosis not present

## 2018-05-13 DIAGNOSIS — N183 Chronic kidney disease, stage 3 (moderate): Secondary | ICD-10-CM | POA: Diagnosis not present

## 2018-05-13 DIAGNOSIS — N2581 Secondary hyperparathyroidism of renal origin: Secondary | ICD-10-CM | POA: Diagnosis not present

## 2019-11-06 ENCOUNTER — Ambulatory Visit: Payer: Medicare Other | Admitting: Podiatry

## 2020-04-02 ENCOUNTER — Other Ambulatory Visit: Payer: Self-pay

## 2020-04-02 DIAGNOSIS — N184 Chronic kidney disease, stage 4 (severe): Secondary | ICD-10-CM

## 2020-04-09 ENCOUNTER — Ambulatory Visit (INDEPENDENT_AMBULATORY_CARE_PROVIDER_SITE_OTHER)
Admission: RE | Admit: 2020-04-09 | Discharge: 2020-04-09 | Disposition: A | Discharge status: Home / Self Care | Payer: Medicare Other | Source: Ambulatory Visit | Attending: Vascular Surgery | Admitting: Vascular Surgery

## 2020-04-09 ENCOUNTER — Other Ambulatory Visit: Payer: Self-pay

## 2020-04-09 ENCOUNTER — Ambulatory Visit: Payer: Medicare Other | Admitting: Vascular Surgery

## 2020-04-09 ENCOUNTER — Ambulatory Visit (HOSPITAL_COMMUNITY)
Admission: RE | Admit: 2020-04-09 | Discharge: 2020-04-09 | Disposition: A | Discharge status: Home / Self Care | Payer: Medicare Other | Source: Ambulatory Visit | Attending: Vascular Surgery | Admitting: Vascular Surgery

## 2020-04-09 ENCOUNTER — Encounter: Payer: Self-pay | Admitting: Vascular Surgery

## 2020-04-09 VITALS — BP 153/75 | HR 71 | Temp 98.2°F | Resp 20 | Ht 72.0 in | Wt 277.0 lb

## 2020-04-09 DIAGNOSIS — N184 Chronic kidney disease, stage 4 (severe): Secondary | ICD-10-CM

## 2020-04-09 NOTE — Progress Notes (Signed)
ASSESSMENT & PLAN:  74 y.o. male with CKDIV nearing ESRD. No suitable vein for autologous access. Counseled that he would need synthetic access, but would prefer to wait until closer to HD. He seems interested in PD and wants to discuss this further with his nephrologist. Return to care if he desires HD.   CHIEF COMPLAINT:   Needs access  HISTORY:  HISTORY OF PRESENT ILLNESS: Kelly Dunn is a 74 y.o. male with CKD 4 approaching end-stage renal disease. He is referred for creation of dialysis access. He is right-handed. He denies any history of trauma to the upper extremities. No previous central venous access or pacemakers. We had a lengthy discussion about different modalities of dialysis. He presently seems interested in peritoneal dialysis.  Past Medical History:  Diagnosis Date  . Chronic kidney disease (CKD), stage III (moderate) (HCC)   . Diabetes mellitus   . Gout   . Hypercholesteremia   . Hypertension     History reviewed. No pertinent surgical history.  Family History  Problem Relation Age of Onset  . Diabetes Other   . Hypertension Other     Social History   Socioeconomic History  . Marital status: Married    Spouse name: Not on file  . Number of children: Not on file  . Years of education: Not on file  . Highest education level: Not on file  Occupational History  . Not on file  Tobacco Use  . Smoking status: Former Research scientist (life sciences)  . Smokeless tobacco: Former Systems developer    Quit date: 07/16/1972  Vaping Use  . Vaping Use: Never used  Substance and Sexual Activity  . Alcohol use: No  . Drug use: No  . Sexual activity: Not on file  Other Topics Concern  . Not on file  Social History Narrative  . Not on file   Social Determinants of Health   Financial Resource Strain:   . Difficulty of Paying Living Expenses: Not on file  Food Insecurity:   . Worried About Charity fundraiser in the Last Year: Not on file  . Ran Out of Food in the Last Year: Not on file    Transportation Needs:   . Lack of Transportation (Medical): Not on file  . Lack of Transportation (Non-Medical): Not on file  Physical Activity:   . Days of Exercise per Week: Not on file  . Minutes of Exercise per Session: Not on file  Stress:   . Feeling of Stress : Not on file  Social Connections:   . Frequency of Communication with Friends and Family: Not on file  . Frequency of Social Gatherings with Friends and Family: Not on file  . Attends Religious Services: Not on file  . Active Member of Clubs or Organizations: Not on file  . Attends Archivist Meetings: Not on file  . Marital Status: Not on file  Intimate Partner Violence:   . Fear of Current or Ex-Partner: Not on file  . Emotionally Abused: Not on file  . Physically Abused: Not on file  . Sexually Abused: Not on file    No Known Allergies  Current Outpatient Medications  Medication Sig Dispense Refill  . amLODipine (NORVASC) 10 MG tablet Take 10 mg by mouth daily.    Marland Kitchen aspirin 81 MG tablet Take 81 mg by mouth daily.    Marland Kitchen atorvastatin (LIPITOR) 40 MG tablet Take 40 mg by mouth daily.    . carvedilol (COREG) 25 MG tablet Take 25  mg by mouth 2 (two) times daily with a meal.    . Cholecalciferol (VITAMIN D3) 5000 units CAPS Take by mouth.    . furosemide (LASIX) 40 MG tablet Take 40 mg by mouth daily.    . hydrochlorothiazide (HYDRODIURIL) 25 MG tablet Take 25 mg by mouth daily.    . insulin glargine (LANTUS) 100 UNIT/ML injection Inject into the skin at bedtime.    Marland Kitchen atenolol (TENORMIN) 100 MG tablet Take 100 mg by mouth daily. (Patient not taking: Reported on 04/09/2020)    . colchicine 0.6 MG tablet Take 1 tablet (0.6 mg total) by mouth 2 (two) times daily. 30 tablet 1  . glipiZIDE (GLUCOTROL) 5 MG tablet Take 5 mg by mouth 2 (two) times daily before a meal. (Patient not taking: Reported on 04/09/2020)    . oxyCODONE-acetaminophen (PERCOCET) 5-325 MG per tablet Take 1 tablet by mouth every 4 (four) hours  as needed for pain. (Patient not taking: Reported on 07/16/2017) 20 tablet 0   No current facility-administered medications for this visit.    REVIEW OF SYSTEMS:  [X]  denotes positive finding, [ ]  denotes negative finding Cardiac  Comments:  Chest pain or chest pressure:    Shortness of breath upon exertion:    Short of breath when lying flat:    Irregular heart rhythm:        Vascular    Pain in calf, thigh, or hip brought on by ambulation:    Pain in feet at night that wakes you up from your sleep:     Blood clot in your veins:    Leg swelling:         Pulmonary    Oxygen at home:    Productive cough:     Wheezing:         Neurologic    Sudden weakness in arms or legs:     Sudden numbness in arms or legs:     Sudden onset of difficulty speaking or slurred speech:    Temporary loss of vision in one eye:     Problems with dizziness:         Gastrointestinal    Blood in stool:     Vomited blood:         Genitourinary    Burning when urinating:     Blood in urine:        Psychiatric    Major depression:         Hematologic    Bleeding problems:    Problems with blood clotting too easily:        Skin    Rashes or ulcers:        Constitutional    Fever or chills:     PHYSICAL EXAM:   Vitals:   04/09/20 1403  BP: (!) 153/75  Pulse: 71  Resp: 20  Temp: 98.2 F (36.8 C)  SpO2: 97%  Weight: 277 lb (125.6 kg)  Height: 6' (1.829 m)    Constitutional: Well appearing in no distress. Appears well nourished.  Neurologic: Normal gait and station. CN intact. No weakness. No sensory loss. Psychiatric: Mood and affect symmetric and appropriate. Eyes: No icterus. No conjunctival pallor. Ears, nose, throat: mucous membranes moist. Midline trachea. No carotid bruit. Cardiac: regular rate and rhythm.  Respiratory: unlabored. Abdominal: soft, non-tender, non-distended. No palpable pulsatile abdominal mass. Peripheral vascular:  Radial pulse: L 2+ / R 2+ Extremity:  No edema. No cyanosis. No pallor.  Skin: No gangrene. No ulceration.  Lymphatic: No Stemmer's sign. No palpable lymphadenopathy.   DATA REVIEW:    Most recent CBC CBC Latest Ref Rng & Units 01/25/2012  Hemoglobin 13.0 - 17.0 g/dL 13.9  Hematocrit 39 - 52 % 41.0     Most recent CMP CMP Latest Ref Rng & Units 01/25/2012  Glucose 70 - 99 mg/dL 361(H)  BUN 6 - 23 mg/dL 32(H)  Creatinine 0.50 - 1.35 mg/dL 2.00(H)  Sodium 135 - 145 mEq/L 136  Potassium 3.5 - 5.1 mEq/L 4.9  Chloride 96 - 112 mEq/L 104       Dea Bitting N. Stanford Breed, MD [facility] Phone Number: [phone] 04/09/2020 4:28 PM

## 2020-06-24 DIAGNOSIS — N185 Chronic kidney disease, stage 5: Secondary | ICD-10-CM | POA: Diagnosis not present

## 2020-06-24 DIAGNOSIS — E1122 Type 2 diabetes mellitus with diabetic chronic kidney disease: Secondary | ICD-10-CM | POA: Diagnosis not present

## 2020-06-24 DIAGNOSIS — I120 Hypertensive chronic kidney disease with stage 5 chronic kidney disease or end stage renal disease: Secondary | ICD-10-CM | POA: Diagnosis not present

## 2020-06-24 DIAGNOSIS — D649 Anemia, unspecified: Secondary | ICD-10-CM | POA: Diagnosis not present

## 2020-06-24 DIAGNOSIS — N2581 Secondary hyperparathyroidism of renal origin: Secondary | ICD-10-CM | POA: Diagnosis not present

## 2020-06-24 DIAGNOSIS — N184 Chronic kidney disease, stage 4 (severe): Secondary | ICD-10-CM | POA: Diagnosis not present

## 2020-06-24 DIAGNOSIS — E785 Hyperlipidemia, unspecified: Secondary | ICD-10-CM | POA: Diagnosis not present

## 2020-06-24 DIAGNOSIS — M109 Gout, unspecified: Secondary | ICD-10-CM | POA: Diagnosis not present

## 2020-07-02 ENCOUNTER — Other Ambulatory Visit: Payer: Self-pay

## 2020-07-11 ENCOUNTER — Encounter (HOSPITAL_COMMUNITY): Payer: Self-pay

## 2020-07-11 NOTE — Progress Notes (Signed)
EKG: DOS CXR: na ECHO: denies Stress Test: denies Cardiac Cath: denies  Fasting Blood Sugar- 130-200 Checks Blood Sugar_2__ times a day  OSA/CPAP:  No  ASA/Blood Thinner:  No  Covid test 07/12/20  Anesthesia Review:  No  Patient denies shortness of breath, fever, cough, and chest pain at PAT appointment.  Patient verbalized understanding of instructions provided today at the PAT appointment.  Patient asked to review instructions at home and day of surgery.

## 2020-07-12 ENCOUNTER — Other Ambulatory Visit (HOSPITAL_COMMUNITY)
Admission: RE | Admit: 2020-07-12 | Discharge: 2020-07-12 | Disposition: A | Discharge status: Home / Self Care | Payer: Medicare Other | Source: Ambulatory Visit | Attending: Vascular Surgery | Admitting: Vascular Surgery

## 2020-07-12 DIAGNOSIS — Z20822 Contact with and (suspected) exposure to covid-19: Secondary | ICD-10-CM | POA: Diagnosis not present

## 2020-07-12 DIAGNOSIS — Z01812 Encounter for preprocedural laboratory examination: Secondary | ICD-10-CM | POA: Diagnosis not present

## 2020-07-12 LAB — SARS CORONAVIRUS 2 (TAT 6-24 HRS): SARS Coronavirus 2: NEGATIVE

## 2020-07-12 MED ORDER — DEXTROSE 5 % IV SOLN
3.0000 g | INTRAVENOUS | Status: AC
  Administered 2020-07-15: 3 g via INTRAVENOUS
  Filled 2020-07-12: qty 3

## 2020-07-15 ENCOUNTER — Other Ambulatory Visit: Payer: Self-pay

## 2020-07-15 ENCOUNTER — Encounter (HOSPITAL_COMMUNITY): Payer: Self-pay | Admitting: Vascular Surgery

## 2020-07-15 ENCOUNTER — Encounter (HOSPITAL_COMMUNITY)
Admission: RE | Disposition: A | Discharge status: Home / Self Care | Payer: Self-pay | Source: Home / Self Care | Attending: Vascular Surgery

## 2020-07-15 ENCOUNTER — Ambulatory Visit (HOSPITAL_COMMUNITY): Payer: Medicare Other | Admitting: Anesthesiology

## 2020-07-15 ENCOUNTER — Ambulatory Visit (HOSPITAL_COMMUNITY)
Admission: RE | Admit: 2020-07-15 | Discharge: 2020-07-15 | Disposition: A | Discharge status: Home / Self Care | Payer: Medicare Other | Attending: Vascular Surgery | Admitting: Vascular Surgery

## 2020-07-15 DIAGNOSIS — N184 Chronic kidney disease, stage 4 (severe): Secondary | ICD-10-CM | POA: Insufficient documentation

## 2020-07-15 DIAGNOSIS — N185 Chronic kidney disease, stage 5: Secondary | ICD-10-CM | POA: Diagnosis not present

## 2020-07-15 DIAGNOSIS — Z87891 Personal history of nicotine dependence: Secondary | ICD-10-CM | POA: Diagnosis not present

## 2020-07-15 DIAGNOSIS — Z794 Long term (current) use of insulin: Secondary | ICD-10-CM | POA: Diagnosis not present

## 2020-07-15 DIAGNOSIS — Z7982 Long term (current) use of aspirin: Secondary | ICD-10-CM | POA: Diagnosis not present

## 2020-07-15 DIAGNOSIS — E7800 Pure hypercholesterolemia, unspecified: Secondary | ICD-10-CM | POA: Diagnosis not present

## 2020-07-15 DIAGNOSIS — N183 Chronic kidney disease, stage 3 unspecified: Secondary | ICD-10-CM | POA: Diagnosis not present

## 2020-07-15 DIAGNOSIS — Z7984 Long term (current) use of oral hypoglycemic drugs: Secondary | ICD-10-CM | POA: Insufficient documentation

## 2020-07-15 DIAGNOSIS — Z79899 Other long term (current) drug therapy: Secondary | ICD-10-CM | POA: Insufficient documentation

## 2020-07-15 DIAGNOSIS — I129 Hypertensive chronic kidney disease with stage 1 through stage 4 chronic kidney disease, or unspecified chronic kidney disease: Secondary | ICD-10-CM | POA: Insufficient documentation

## 2020-07-15 DIAGNOSIS — E1122 Type 2 diabetes mellitus with diabetic chronic kidney disease: Secondary | ICD-10-CM | POA: Insufficient documentation

## 2020-07-15 HISTORY — PX: AV FISTULA PLACEMENT: SHX1204

## 2020-07-15 LAB — POCT I-STAT, CHEM 8
BUN: 65 mg/dL — ABNORMAL HIGH (ref 8–23)
Calcium, Ion: 1.2 mmol/L (ref 1.15–1.40)
Chloride: 109 mmol/L (ref 98–111)
Creatinine, Ser: 4.3 mg/dL — ABNORMAL HIGH (ref 0.61–1.24)
Glucose, Bld: 227 mg/dL — ABNORMAL HIGH (ref 70–99)
HCT: 33 % — ABNORMAL LOW (ref 39.0–52.0)
Hemoglobin: 11.2 g/dL — ABNORMAL LOW (ref 13.0–17.0)
Potassium: 4.7 mmol/L (ref 3.5–5.1)
Sodium: 142 mmol/L (ref 135–145)
TCO2: 24 mmol/L (ref 22–32)

## 2020-07-15 LAB — GLUCOSE, CAPILLARY
Glucose-Capillary: 231 mg/dL — ABNORMAL HIGH (ref 70–99)
Glucose-Capillary: 164 mg/dL — ABNORMAL HIGH (ref 70–99)

## 2020-07-15 SURGERY — INSERTION OF ARTERIOVENOUS (AV) GORE-TEX GRAFT ARM
Anesthesia: Monitor Anesthesia Care | Laterality: Left

## 2020-07-15 MED ORDER — LIDOCAINE-EPINEPHRINE (PF) 1 %-1:200000 IJ SOLN
INTRAMUSCULAR | Status: AC
  Filled 2020-07-15: qty 30

## 2020-07-15 MED ORDER — PROPOFOL 1000 MG/100ML IV EMUL
INTRAVENOUS | Status: AC
  Filled 2020-07-15: qty 100

## 2020-07-15 MED ORDER — OXYCODONE HCL 5 MG PO TABS: 5.0000 mg | ORAL_TABLET | Freq: Once | ORAL | Status: DC | PRN

## 2020-07-15 MED ORDER — SODIUM CHLORIDE 0.9 % IV SOLN
INTRAVENOUS | Status: AC
  Filled 2020-07-15: qty 1.2

## 2020-07-15 MED ORDER — SODIUM CHLORIDE 0.9 % IV SOLN: INTRAVENOUS | Status: DC | PRN

## 2020-07-15 MED ORDER — HYDROCODONE-ACETAMINOPHEN 5-325 MG PO TABS: 1.0000 | ORAL_TABLET | Freq: Four times a day (QID) | ORAL | 0 refills | Status: DC | PRN

## 2020-07-15 MED ORDER — ORAL CARE MOUTH RINSE: 15.0000 mL | Freq: Once | OROMUCOSAL | Status: AC

## 2020-07-15 MED ORDER — FENTANYL CITRATE (PF) 100 MCG/2ML IJ SOLN: 25.0000 ug | INTRAMUSCULAR | Status: DC | PRN

## 2020-07-15 MED ORDER — LIDOCAINE-EPINEPHRINE (PF) 1.5 %-1:200000 IJ SOLN
INTRAMUSCULAR | Status: DC | PRN
  Administered 2020-07-15: 25 mL via PERINEURAL

## 2020-07-15 MED ORDER — 0.9 % SODIUM CHLORIDE (POUR BTL) OPTIME
TOPICAL | Status: DC | PRN
  Administered 2020-07-15: 1000 mL

## 2020-07-15 MED ORDER — CHLORHEXIDINE GLUCONATE 0.12 % MT SOLN
15.0000 mL | Freq: Once | OROMUCOSAL | Status: AC
  Administered 2020-07-15: 15 mL via OROMUCOSAL
  Filled 2020-07-15: qty 15

## 2020-07-15 MED ORDER — PROTAMINE SULFATE 10 MG/ML IV SOLN
INTRAVENOUS | Status: DC | PRN
  Administered 2020-07-15: 50 mg via INTRAVENOUS

## 2020-07-15 MED ORDER — MIDAZOLAM HCL 2 MG/2ML IJ SOLN
INTRAMUSCULAR | Status: AC
  Filled 2020-07-15: qty 2

## 2020-07-15 MED ORDER — FENTANYL CITRATE (PF) 250 MCG/5ML IJ SOLN
INTRAMUSCULAR | Status: AC
  Filled 2020-07-15: qty 5

## 2020-07-15 MED ORDER — CHLORHEXIDINE GLUCONATE 4 % EX LIQD: 60.0000 mL | Freq: Once | CUTANEOUS | Status: DC

## 2020-07-15 MED ORDER — FENTANYL CITRATE (PF) 250 MCG/5ML IJ SOLN
INTRAMUSCULAR | Status: DC | PRN
  Administered 2020-07-15: 50 ug via INTRAVENOUS

## 2020-07-15 MED ORDER — SODIUM CHLORIDE 0.9 % IV SOLN: INTRAVENOUS | Status: DC

## 2020-07-15 MED ORDER — ONDANSETRON HCL 4 MG/2ML IJ SOLN: 4.0000 mg | Freq: Four times a day (QID) | INTRAMUSCULAR | Status: DC | PRN

## 2020-07-15 MED ORDER — OXYCODONE HCL 5 MG/5ML PO SOLN: 5.0000 mg | Freq: Once | ORAL | Status: DC | PRN

## 2020-07-15 MED ORDER — PROPOFOL 500 MG/50ML IV EMUL
INTRAVENOUS | Status: DC | PRN
  Administered 2020-07-15: 25 ug/kg/min via INTRAVENOUS

## 2020-07-15 MED ORDER — MIDAZOLAM HCL 2 MG/2ML IJ SOLN
INTRAMUSCULAR | Status: DC | PRN
  Administered 2020-07-15: 1 mg via INTRAVENOUS

## 2020-07-15 MED ORDER — HEPARIN SODIUM (PORCINE) 1000 UNIT/ML IJ SOLN
INTRAMUSCULAR | Status: DC | PRN
  Administered 2020-07-15: 5000 [IU] via INTRAVENOUS

## 2020-07-15 SURGICAL SUPPLY — 34 items
ADH SKN CLS APL DERMABOND .7 (GAUZE/BANDAGES/DRESSINGS) ×1
AGENT HMST SPONGE THK3/8 (HEMOSTASIS)
ARMBAND PINK RESTRICT EXTREMIT (MISCELLANEOUS) ×4 IMPLANT
CANISTER SUCT 3000ML PPV (MISCELLANEOUS) ×2 IMPLANT
CANNULA VESSEL 3MM 2 BLNT TIP (CANNULA) ×2 IMPLANT
CLIP VESOCCLUDE MED 6/CT (CLIP) ×2 IMPLANT
CLIP VESOCCLUDE SM WIDE 6/CT (CLIP) ×2 IMPLANT
COVER PROBE W GEL 5X96 (DRAPES) ×1 IMPLANT
COVER WAND RF STERILE (DRAPES) ×2 IMPLANT
DECANTER SPIKE VIAL GLASS SM (MISCELLANEOUS) ×3 IMPLANT
DERMABOND ADVANCED (GAUZE/BANDAGES/DRESSINGS) ×1
DERMABOND ADVANCED .7 DNX12 (GAUZE/BANDAGES/DRESSINGS) ×1 IMPLANT
ELECT REM PT RETURN 9FT ADLT (ELECTROSURGICAL) ×2
ELECTRODE REM PT RTRN 9FT ADLT (ELECTROSURGICAL) ×1 IMPLANT
GLOVE BIO SURGEON STRL SZ7.5 (GLOVE) ×2 IMPLANT
GLOVE SURG UNDER POLY LF SZ6.5 (GLOVE) ×1 IMPLANT
GOWN STRL REUS W/ TWL LRG LVL3 (GOWN DISPOSABLE) ×3 IMPLANT
GOWN STRL REUS W/TWL LRG LVL3 (GOWN DISPOSABLE) ×6
HEMOSTAT SPONGE AVITENE ULTRA (HEMOSTASIS) IMPLANT
KIT BASIN OR (CUSTOM PROCEDURE TRAY) ×2 IMPLANT
KIT TURNOVER KIT B (KITS) ×2 IMPLANT
NS IRRIG 1000ML POUR BTL (IV SOLUTION) ×2 IMPLANT
PACK CV ACCESS (CUSTOM PROCEDURE TRAY) ×2 IMPLANT
PAD ARMBOARD 7.5X6 YLW CONV (MISCELLANEOUS) ×4 IMPLANT
SUT PROLENE 6 0 BV (SUTURE) ×2 IMPLANT
SUT PROLENE 6 0 CC (SUTURE) ×4 IMPLANT
SUT VIC AB 3-0 SH 27 (SUTURE) ×4
SUT VIC AB 3-0 SH 27X BRD (SUTURE) ×2 IMPLANT
SUT VICRYL 4-0 PS2 18IN ABS (SUTURE) ×4 IMPLANT
SYR CONTROL 10ML LL (SYRINGE) ×1 IMPLANT
SYR TOOMEY 50ML (SYRINGE) IMPLANT
TOWEL GREEN STERILE (TOWEL DISPOSABLE) ×2 IMPLANT
UNDERPAD 30X36 HEAVY ABSORB (UNDERPADS AND DIAPERS) ×2 IMPLANT
WATER STERILE IRR 1000ML POUR (IV SOLUTION) ×2 IMPLANT

## 2020-07-15 NOTE — Op Note (Signed)
Procedure: Ultrasound left arm for vein mapping, placement left brachiocephalic AV fistula  Preoperative diagnosis: CKD for  Postoperative diagnosis: Same  Anesthesia: Left upper extremity block  Operative findings: 3 mm left cephalic vein  Assistant: Arlee Muslim, PA-C for expediting procedure creating exposure and creation of anastomosis  Operative details: After obtaining form consent, patient taken the operating.  Patient placed in supine position operating table.  A block had been placed by the anesthesia team preprocedure.  Sterile prep and drape was performed in the left arm.  Ultrasound was used to identify the left cephalic vein near the antecubital crease.  It was about 3 mm in diameter.  It was fairly uniform all the way up to the level of the shoulder.  Next a transverse incision was made in the antecubital region of the left arm.  Incision was carried down through the subcutaneous tissues down the level of the left cephalic vein.  It was of good quality about 3 mm in diameter.  It was dissected free circumferentially and small side branches ligated and divided tween silk ties.  Next the brachial artery was dissected free in the medial portion the incision.  The artery was about 3-1/2 to 4 mm in diameter.  It was soft and had a good pulse within it.  It was dissected free circumferentially.  Small side branches were ligated with a 6-0 Prolene suture.  Patient was given 5000 units of intravenous heparin.  The distal cephalic vein was ligated with a 2-0 and 3-0 silk tie.  The vein was transected.  The vein was gently flushed with heparinized saline and distended.  It was marked for orientation.  Vesseloops were used to control the brachial artery proximally distally.  Longitudinal opening was made in the brachial artery the vein was then sewn end of vein to side of artery using a running 6-0 Prolene suture.  Just prior to completion of the anastomosis it was for blood backbled and thoroughly  flushed.  Anastomosis was secured clamps were released and there was a palpable thrill in fistula immediately.  Hemostasis was obtained with direct pressure and 50 mg of protamine.  Subcutaneous tissues were reapproximated using running 3-0 Vicryl suture.  Skin was closed with 4-0 Vicryl subcuticular stitch.  Patient had a palpable radial pulse during the case.  Patient tolerated procedure well and there were no complications.  Instrument sponge and needle count was correct in the case.  Patient was taken to recovery in stable condition.  Ruta Hinds, MD [facility] [place] Office: [phone]

## 2020-07-15 NOTE — Progress Notes (Signed)
Orthopedic Tech Progress Note Patient Details:  Kelly Dunn [birth year] [id number] PACU RN called requesting an ARM SLING. Stated he would apply once patient was dressed Ortho Devices Type of Ortho Device: Arm sling Ortho Device/Splint Interventions: Other (comment)   Post Interventions Patient Tolerated: Other (comment) Instructions Provided: Other (comment)   Janit Pagan 07/15/2020, 9:25 AM

## 2020-07-15 NOTE — Discharge Instructions (Signed)
° °  [facility] ° °Discharge Instructions ° °AV Fistula or Graft Surgery for Dialysis Access ° °Please refer to the following instructions for your post-procedure care. Your surgeon or physician assistant will discuss any changes with you. ° °Activity ° °You may drive the day following your surgery, if you are comfortable and no longer taking prescription pain medication. Resume full activity as the soreness in your incision resolves. ° °Bathing/Showering ° °You may shower after you go home. Keep your incision dry for 48 hours. Do not soak in a bathtub, hot tub, or swim until the incision heals completely. You may not shower if you have a hemodialysis catheter. ° °Incision Care ° °Clean your incision with mild soap and water after 48 hours. Pat the area dry with a clean towel. You do not need a bandage unless otherwise instructed. Do not apply any ointments or creams to your incision. You may have skin glue on your incision. Do not peel it off. It will come off on its own in about one week. Your arm may swell a bit after surgery. To reduce swelling use pillows to elevate your arm so it is above your heart. Your doctor will tell you if you need to lightly wrap your arm with an ACE bandage. ° °Diet ° °Resume your normal diet. There are not special food restrictions following this procedure. In order to heal from your surgery, it is CRITICAL to get adequate nutrition. Your body requires vitamins, minerals, and protein. Vegetables are the best source of vitamins and minerals. Vegetables also provide the perfect balance of protein. Processed food has little nutritional value, so try to avoid this. ° °Medications ° °Resume taking all of your medications. If your incision is causing pain, you may take over-the counter pain relievers such as acetaminophen (Tylenol). If you were prescribed a stronger pain medication, please be aware these medications can cause nausea and constipation. Prevent  nausea by taking the medication with a snack or meal. Avoid constipation by drinking plenty of fluids and eating foods with high amount of fiber, such as fruits, vegetables, and grains. Do not take Tylenol if you are taking prescription pain medications. ° ° ° ° °Follow up °Your surgeon may want to see you in the office following your access surgery. If so, this will be arranged at the time of your surgery. ° °Please call us immediately for any of the following conditions: ° °Increased pain, redness, drainage (pus) from your incision site °Fever of 101 degrees or higher °Severe or worsening pain at your incision site °Hand pain or numbness. ° °Reduce your risk of vascular disease: ° °Stop smoking. If you would like help, call [facility] at 1-800-QUIT-NOW (1-800-784-8669) or [place] at 336-586-4000 ° °Manage your cholesterol °Maintain a desired weight °Control your diabetes °Keep your blood pressure down ° °Dialysis ° °It will take several weeks to several months for your new dialysis access to be ready for use. Your surgeon will determine when it is OK to use it. Your nephrologist will continue to direct your dialysis. You can continue to use your Permcath until your new access is ready for use. ° °If you have any questions, please call the office at [phone]. ° °

## 2020-07-15 NOTE — Anesthesia Procedure Notes (Signed)
Procedure Name: MAC Date/Time: 07/15/2020 7:42 AM Performed by: Amadeo Garnet, CRNA Pre-anesthesia Checklist: Patient identified, Suction available, Emergency Drugs available and Patient being monitored Patient Re-evaluated:Patient Re-evaluated prior to induction Oxygen Delivery Method: Simple face mask Preoxygenation: Pre-oxygenation with 100% oxygen Induction Type: IV induction Placement Confirmation: positive ETCO2 Dental Injury: Teeth and Oropharynx as per pre-operative assessment

## 2020-07-15 NOTE — Anesthesia Preprocedure Evaluation (Signed)
Anesthesia Evaluation  Patient identified by MRN, date of birth, ID band Patient awake    Reviewed: Allergy & Precautions, H&P , NPO status , Patient's Chart, lab work & pertinent test results  Airway Mallampati: II   Neck ROM: full    Dental   Pulmonary former smoker,    breath sounds clear to auscultation       Cardiovascular hypertension,  Rhythm:regular Rate:Normal     Neuro/Psych    GI/Hepatic   Endo/Other  diabetes, Type 2  Renal/GU Renal InsufficiencyRenal disease     Musculoskeletal   Abdominal   Peds  Hematology   Anesthesia Other Findings   Reproductive/Obstetrics                             Anesthesia Physical Anesthesia Plan  ASA: III  Anesthesia Plan: MAC and Regional   Post-op Pain Management:    Induction: Intravenous  PONV Risk Score and Plan: 1 and Propofol infusion, Ondansetron and Treatment may vary due to age or medical condition  Airway Management Planned: Simple Face Mask  Additional Equipment:   Intra-op Plan:   Post-operative Plan:   Informed Consent: I have reviewed the patients History and Physical, chart, labs and discussed the procedure including the risks, benefits and alternatives for the proposed anesthesia with the patient or authorized representative who has indicated his/her understanding and acceptance.     Dental advisory given  Plan Discussed with: CRNA, Anesthesiologist and Surgeon  Anesthesia Plan Comments:         Anesthesia Quick Evaluation

## 2020-07-15 NOTE — Anesthesia Procedure Notes (Signed)
Anesthesia Regional Block: Supraclavicular block   Pre-Anesthetic Checklist: ,, timeout performed, Correct Patient, Correct Site, Correct Laterality, Correct Procedure, Correct Position, site marked, Risks and benefits discussed,  Surgical consent,  Pre-op evaluation,  At surgeon's request and post-op pain management  Laterality: Left  Prep: chloraprep       Needles:  Injection technique: Single-shot  Needle Type: Echogenic Stimulator Needle     Needle Length: 5cm  Needle Gauge: 22     Additional Needles:   Procedures:, nerve stimulator,,,,,,,   Nerve Stimulator or Paresthesia:  Response: biceps flexion, 0.45 mA,   Additional Responses:   Narrative:  Start time: 07/15/2020 7:02 AM End time: 07/15/2020 7:12 AM Injection made incrementally with aspirations every 5 mL.  Performed by: Personally  Anesthesiologist: Albertha Ghee, MD  Additional Notes: Functioning IV was confirmed and monitors were applied.  A 68m 22ga Arrow echogenic stimulator needle was used. Sterile prep and drape,hand hygiene and sterile gloves were used.  Negative aspiration and negative test dose prior to incremental administration of local anesthetic. The patient tolerated the procedure well.  Ultrasound guidance: relevent anatomy identified, needle position confirmed, local anesthetic spread visualized around nerve(s), vascular puncture avoided.  Image printed for medical record.

## 2020-07-15 NOTE — Transfer of Care (Signed)
Immediate Anesthesia Transfer of Care Note  Patient: Kelly Dunn  Procedure(s) Performed: Brachiocephalic Arteriovenous Fistula (Left )  Patient Location: PACU  Anesthesia Type:MAC combined with regional for post-op pain  Level of Consciousness: awake, alert  and oriented  Airway & Oxygen Therapy: Patient Spontanous Breathing and Patient connected to face mask oxygen  Post-op Assessment: Report given to RN, Post -op Vital signs reviewed and stable and Patient moving all extremities  Post vital signs: Reviewed and stable  Last Vitals:  Vitals Value Taken Time  BP 120/58 07/15/20 0859  Temp    Pulse 66 07/15/20 0901  Resp 21 07/15/20 0901  SpO2 92 % 07/15/20 0901  Vitals shown include unvalidated device data.  Last Pain:  Vitals:   07/15/20 0623  TempSrc:   PainSc: 0-No pain         Complications: No complications documented.

## 2020-07-15 NOTE — H&P (Signed)
ASSESSMENT & PLAN:  75 y.o. male with CKDIV nearing ESRD. Left arm AV access today fistula versus graft.  Risks benefits possible complications discussed including but not limited to bleeding infection steal non maturation thrombosis.  He wishes to proceed. CHIEF COMPLAINT:   Needs access  HISTORY:  HISTORY OF PRESENT ILLNESS: Kelly Dunn is a 75 y.o. male with CKD 4 approaching end-stage renal disease. He is referred for creation of dialysis access. He is right-handed. He denies any history of trauma to the upper extremities. No previous central venous access or pacemakers. We had a lengthy discussion about different modalities of dialysis. He presently seems interested in peritoneal dialysis.      Past Medical History:  Diagnosis Date  . Chronic kidney disease (CKD), stage III (moderate) (HCC)   . Diabetes mellitus   . Gout   . Hypercholesteremia   . Hypertension     History reviewed. No pertinent surgical history.       Family History  Problem Relation Age of Onset  . Diabetes Other   . Hypertension Other     Social History   Socioeconomic History  . Marital status: Married    Spouse name: Not on file  . Number of children: Not on file  . Years of education: Not on file  . Highest education level: Not on file  Occupational History  . Not on file  Tobacco Use  . Smoking status: Former Research scientist (life sciences)  . Smokeless tobacco: Former Systems developer    Quit date: 07/16/1972  Vaping Use  . Vaping Use: Never used  Substance and Sexual Activity  . Alcohol use: No  . Drug use: No  . Sexual activity: Not on file  Other Topics Concern  . Not on file  Social History Narrative  . Not on file   Social Determinants of Health      Financial Resource Strain:   . Difficulty of Paying Living Expenses: Not on file  Food Insecurity:   . Worried About Charity fundraiser in the Last Year: Not on file  . Ran Out of Food in the Last Year: Not on file   Transportation Needs:   . Lack of Transportation (Medical): Not on file  . Lack of Transportation (Non-Medical): Not on file  Physical Activity:   . Days of Exercise per Week: Not on file  . Minutes of Exercise per Session: Not on file  Stress:   . Feeling of Stress : Not on file  Social Connections:   . Frequency of Communication with Friends and Family: Not on file  . Frequency of Social Gatherings with Friends and Family: Not on file  . Attends Religious Services: Not on file  . Active Member of Clubs or Organizations: Not on file  . Attends Archivist Meetings: Not on file  . Marital Status: Not on file  Intimate Partner Violence:   . Fear of Current or Ex-Partner: Not on file  . Emotionally Abused: Not on file  . Physically Abused: Not on file  . Sexually Abused: Not on file    No Known Allergies        Current Outpatient Medications  Medication Sig Dispense Refill  . amLODipine (NORVASC) 10 MG tablet Take 10 mg by mouth daily.    Marland Kitchen aspirin 81 MG tablet Take 81 mg by mouth daily.    Marland Kitchen atorvastatin (LIPITOR) 40 MG tablet Take 40 mg by mouth daily.    . carvedilol (COREG) 25 MG tablet  Take 25 mg by mouth 2 (two) times daily with a meal.    . Cholecalciferol (VITAMIN D3) 5000 units CAPS Take by mouth.    . furosemide (LASIX) 40 MG tablet Take 40 mg by mouth daily.    . hydrochlorothiazide (HYDRODIURIL) 25 MG tablet Take 25 mg by mouth daily.    . insulin glargine (LANTUS) 100 UNIT/ML injection Inject into the skin at bedtime.    Marland Kitchen atenolol (TENORMIN) 100 MG tablet Take 100 mg by mouth daily. (Patient not taking: Reported on 04/09/2020)    . colchicine 0.6 MG tablet Take 1 tablet (0.6 mg total) by mouth 2 (two) times daily. 30 tablet 1  . glipiZIDE (GLUCOTROL) 5 MG tablet Take 5 mg by mouth 2 (two) times daily before a meal. (Patient not taking: Reported on 04/09/2020)    . oxyCODONE-acetaminophen (PERCOCET) 5-325 MG per tablet Take 1 tablet  by mouth every 4 (four) hours as needed for pain. (Patient not taking: Reported on 07/16/2017) 20 tablet 0   No current facility-administered medications for this visit.    REVIEW OF SYSTEMS:  '[X]'$  denotes positive finding, '[ ]'$  denotes negative finding Cardiac  Comments:  Chest pain or chest pressure:    Shortness of breath upon exertion:    Short of breath when lying flat:    Irregular heart rhythm:        Vascular    Pain in calf, thigh, or hip brought on by ambulation:    Pain in feet at night that wakes you up from your sleep:     Blood clot in your veins:    Leg swelling:         Pulmonary    Oxygen at home:    Productive cough:     Wheezing:         Neurologic    Sudden weakness in arms or legs:     Sudden numbness in arms or legs:     Sudden onset of difficulty speaking or slurred speech:    Temporary loss of vision in one eye:     Problems with dizziness:         Gastrointestinal    Blood in stool:     Vomited blood:         Genitourinary    Burning when urinating:     Blood in urine:        Psychiatric    Major depression:         Hematologic    Bleeding problems:    Problems with blood clotting too easily:        Skin    Rashes or ulcers:        Constitutional    Fever or chills:     PHYSICAL EXAM:    Vitals:   07/15/20 0607  BP: (!) 150/66  Pulse: 81  Resp: 18  Temp: 97.6 F (36.4 C)  TempSrc: Oral  SpO2: 99%  Weight: 122.5 kg  Height: 6' (1.829 m)    Constitutional: Well appearing in no distress. Appears well nourished.  Neurologic: Normal gait and station. CN intact. No weakness. No sensory loss. Cardiac: regular rate and rhythm.  Respiratory: unlabored. Peripheral vascular:             Radial pulse: L 2+ / R 2+ Extremity: No edema. No cyanosis. No pallor.  Skin: No gangrene. No ulceration.    DATA REVIEW:    Most recent  CBC CBC Latest Ref Rng & Units 01/25/2012  Hemoglobin 13.0 - 17.0 g/dL 13.9  Hematocrit 39 - 52 % 41.0     Most recent CMP CMP Latest Ref Rng & Units 01/25/2012  Glucose 70 - 99 mg/dL 361(H)  BUN 6 - 23 mg/dL 32(H)  Creatinine 0.50 - 1.35 mg/dL 2.00(H)  Sodium 135 - 145 mEq/L 136  Potassium 3.5 - 5.1 mEq/L 4.9  Chloride 96 - 112 mEq/L 104         Ruta Hinds, MD [facility] Office: [phone]

## 2020-07-16 ENCOUNTER — Encounter (HOSPITAL_COMMUNITY): Payer: Self-pay | Admitting: Vascular Surgery

## 2020-07-16 NOTE — Anesthesia Postprocedure Evaluation (Signed)
Anesthesia Post Note  Patient: Kelly Dunn  Procedure(s) Performed: Brachiocephalic Arteriovenous Fistula (Left )     Patient location during evaluation: PACU Anesthesia Type: Regional and MAC Level of consciousness: awake and alert Pain management: pain level controlled Vital Signs Assessment: post-procedure vital signs reviewed and stable Respiratory status: spontaneous breathing, nonlabored ventilation, respiratory function stable and patient connected to nasal cannula oxygen Cardiovascular status: stable and blood pressure returned to baseline Postop Assessment: no apparent nausea or vomiting Anesthetic complications: no   No complications documented.  Last Vitals:  Vitals:   07/15/20 0945 07/15/20 1000  BP: (!) 153/71 (!) 143/64  Pulse: 68 70  Resp: 19 17  Temp: (!) 36.3 C   SpO2: 95% 93%    Last Pain:  Vitals:   07/15/20 0930  TempSrc:   PainSc: 0-No pain                 Dajour Pierpoint S

## 2020-08-17 ENCOUNTER — Other Ambulatory Visit: Payer: Self-pay

## 2020-08-17 DIAGNOSIS — N184 Chronic kidney disease, stage 4 (severe): Secondary | ICD-10-CM | POA: Insufficient documentation

## 2020-08-17 DIAGNOSIS — M1A00X0 Idiopathic chronic gout, unspecified site, without tophus (tophi): Secondary | ICD-10-CM | POA: Insufficient documentation

## 2020-08-17 DIAGNOSIS — M1A9XX0 Chronic gout, unspecified, without tophus (tophi): Secondary | ICD-10-CM | POA: Insufficient documentation

## 2020-08-17 DIAGNOSIS — E1121 Type 2 diabetes mellitus with diabetic nephropathy: Secondary | ICD-10-CM | POA: Insufficient documentation

## 2020-08-17 DIAGNOSIS — N2581 Secondary hyperparathyroidism of renal origin: Secondary | ICD-10-CM | POA: Insufficient documentation

## 2020-08-17 DIAGNOSIS — E7800 Pure hypercholesterolemia, unspecified: Secondary | ICD-10-CM | POA: Insufficient documentation

## 2020-08-17 DIAGNOSIS — I10 Essential (primary) hypertension: Secondary | ICD-10-CM | POA: Insufficient documentation

## 2020-08-22 ENCOUNTER — Other Ambulatory Visit: Payer: Self-pay

## 2020-08-22 ENCOUNTER — Ambulatory Visit (INDEPENDENT_AMBULATORY_CARE_PROVIDER_SITE_OTHER): Payer: Medicare Other | Admitting: Physician Assistant

## 2020-08-22 ENCOUNTER — Ambulatory Visit (HOSPITAL_COMMUNITY)
Admission: RE | Admit: 2020-08-22 | Discharge: 2020-08-22 | Disposition: A | Discharge status: Home / Self Care | Payer: Medicare Other | Source: Ambulatory Visit | Attending: Vascular Surgery | Admitting: Vascular Surgery

## 2020-08-22 VITALS — BP 159/80 | HR 76 | Temp 98.3°F | Resp 20 | Ht 72.0 in | Wt 274.6 lb

## 2020-08-22 DIAGNOSIS — N184 Chronic kidney disease, stage 4 (severe): Secondary | ICD-10-CM | POA: Insufficient documentation

## 2020-08-22 NOTE — Progress Notes (Signed)
Office Note     CC:  follow up Requesting Provider:  No ref. provider found  HPI: Kelly Dunn is a 75 y.o. ([birth year]) male who presents status post left brachiocephalic fistula creation by Dr. [name] on 07/15/2020.  He is CKD stage IV not yet on hemodialysis managed by Dr. Hollie Salk.  He denies any signs or symptoms of steal syndrome in his left arm.  He states the left arm incision has healed nicely.   Past Medical History:  Diagnosis Date  . Chronic kidney disease (CKD), stage III (moderate) (HCC)   . Diabetes mellitus   . Gout   . Hypercholesteremia   . Hypertension     Past Surgical History:  Procedure Laterality Date  . AV FISTULA PLACEMENT Left 07/15/2020   Procedure: Brachiocephalic Arteriovenous Fistula;  Surgeon: Elam Dutch, MD;  Location: [facility] OR;  Service: Vascular;  Laterality: Left;  PERIPHERAL NERVE BLOCK    Social History   Socioeconomic History  . Marital status: Married    Spouse name: Not on file  . Number of children: Not on file  . Years of education: Not on file  . Highest education level: Not on file  Occupational History  . Not on file  Tobacco Use  . Smoking status: Former Research scientist (life sciences)  . Smokeless tobacco: Former Systems developer    Quit date: 07/16/1972  Vaping Use  . Vaping Use: Never used  Substance and Sexual Activity  . Alcohol use: No  . Drug use: No  . Sexual activity: Not on file  Other Topics Concern  . Not on file  Social History Narrative  . Not on file   Social Determinants of Health   Financial Resource Strain: Not on file  Food Insecurity: Not on file  Transportation Needs: Not on file  Physical Activity: Not on file  Stress: Not on file  Social Connections: Not on file  Intimate Partner Violence: Not on file    Family History  Problem Relation Age of Onset  . Diabetes Other   . Hypertension Other     Current Outpatient Medications  Medication Sig Dispense Refill  . allopurinol (ZYLOPRIM) 300 MG tablet Take 300 mg by  mouth daily.    Marland Kitchen amLODipine (NORVASC) 10 MG tablet Take 10 mg by mouth daily.    Marland Kitchen aspirin 81 MG tablet Take 81 mg by mouth daily.    Marland Kitchen atorvastatin (LIPITOR) 40 MG tablet Take 40 mg by mouth daily.    . carvedilol (COREG) 25 MG tablet Take 25 mg by mouth 2 (two) times daily with a meal.    . Cholecalciferol (VITAMIN D3) 5000 units CAPS Take 5,000 Units by mouth daily.    . colchicine 0.6 MG tablet Take 1 tablet (0.6 mg total) by mouth 2 (two) times daily. 30 tablet 1  . furosemide (LASIX) 40 MG tablet Take 40-80 mg by mouth See admin instructions. Take 80 mg in the morning and 40 mg in the evening    . HYDROcodone-acetaminophen (NORCO) 5-325 MG tablet Take 1 tablet by mouth every 6 (six) hours as needed for moderate pain. 10 tablet 0  . insulin glargine (LANTUS) 100 UNIT/ML injection Inject 42 Units into the skin at bedtime.     No current facility-administered medications for this visit.    No Known Allergies   REVIEW OF SYSTEMS:   '[X]'$  denotes positive finding, '[ ]'$  denotes negative finding Cardiac  Comments:  Chest pain or chest pressure:    Shortness of breath upon exertion:  Short of breath when lying flat:    Irregular heart rhythm:        Vascular    Pain in calf, thigh, or hip brought on by ambulation:    Pain in feet at night that wakes you up from your sleep:     Blood clot in your veins:    Leg swelling:         Pulmonary    Oxygen at home:    Productive cough:     Wheezing:         Neurologic    Sudden weakness in arms or legs:     Sudden numbness in arms or legs:     Sudden onset of difficulty speaking or slurred speech:    Temporary loss of vision in one eye:     Problems with dizziness:         Gastrointestinal    Blood in stool:     Vomited blood:         Genitourinary    Burning when urinating:     Blood in urine:        Psychiatric    Major depression:         Hematologic    Bleeding problems:    Problems with blood clotting too easily:         Skin    Rashes or ulcers:        Constitutional    Fever or chills:      PHYSICAL EXAMINATION:  Vitals:   08/22/20 1454  BP: (!) 159/80  Pulse: 76  Resp: 20  Temp: 98.3 F (36.8 C)  TempSrc: Temporal  SpO2: 97%  Weight: 274 lb 9.6 oz (124.6 kg)  Height: 6' (1.829 m)    General:  WDWN in NAD; vital signs documented above Gait: Not observed HENT: WNL, normocephalic Pulmonary: normal non-labored breathing Cardiac: regular HR Abdomen: soft, NT, no masses Skin: without rashes Vascular Exam/Pulses:  Right Left  Radial 2+ (normal) 2+ (normal)   Extremities: Left arm fistula with palpable thrill, this becomes more difficult to feel as he move into the mid upper arm Musculoskeletal: no muscle wasting or atrophy  Neurologic: A&O X 3;  No focal weakness or paresthesias are detected Psychiatric:  The pt has Normal affect.   Non-Invasive Vascular Imaging:   Adequate diameter of fistula throughout upper arm Path of fistula is greater than 6 mm from the surface of the skin from the distal upper arm to the proximal upper arm    ASSESSMENT/PLAN:: 75 y.o. male here 5 weeks status post left brachiocephalic fistula creation  -Patent brachiocephalic fistula with palpable thrill and no signs or symptoms of steal syndrome in left hand -Fistula appears to be maturing well however will likely be to deep to reliably cannulate on a regular basis -Plan will be for left arm fistula superficialization versus transposition with Dr. [name] in the next few weeks -Risk, benefits, and alternatives to access surgery were discussed.   -The patient is aware the risks include but are not limited to: bleeding, infection, steal syndrome, nerve damage, thrombosis, failure to mature, and need for additional procedures.   -The patient agrees to proceed with the procedure.    [name], PA-C [facility] [phone]  Clinic MD:   [name]

## 2020-08-22 NOTE — H&P (View-Only) (Signed)
Office Note     CC:  follow up Requesting Provider:  No ref. provider found  HPI: Kelly Dunn is a 75 y.o. (Jan 29, 1946) male who presents status post left brachiocephalic fistula creation by Dr. [name] on 07/15/2020.  He is CKD stage IV not yet on hemodialysis managed by Dr. Hollie Salk.  He denies any signs or symptoms of steal syndrome in his left arm.  He states the left arm incision has healed nicely.   Past Medical History:  Diagnosis Date  . Chronic kidney disease (CKD), stage III (moderate) (HCC)   . Diabetes mellitus   . Gout   . Hypercholesteremia   . Hypertension     Past Surgical History:  Procedure Laterality Date  . AV FISTULA PLACEMENT Left 07/15/2020   Procedure: Brachiocephalic Arteriovenous Fistula;  Surgeon: Elam Dutch, MD;  Location: [facility] OR;  Service: Vascular;  Laterality: Left;  PERIPHERAL NERVE BLOCK    Social History   Socioeconomic History  . Marital status: Married    Spouse name: Not on file  . Number of children: Not on file  . Years of education: Not on file  . Highest education level: Not on file  Occupational History  . Not on file  Tobacco Use  . Smoking status: Former Research scientist (life sciences)  . Smokeless tobacco: Former Systems developer    Quit date: 07/16/1972  Vaping Use  . Vaping Use: Never used  Substance and Sexual Activity  . Alcohol use: No  . Drug use: No  . Sexual activity: Not on file  Other Topics Concern  . Not on file  Social History Narrative  . Not on file   Social Determinants of Health   Financial Resource Strain: Not on file  Food Insecurity: Not on file  Transportation Needs: Not on file  Physical Activity: Not on file  Stress: Not on file  Social Connections: Not on file  Intimate Partner Violence: Not on file    Family History  Problem Relation Age of Onset  . Diabetes Other   . Hypertension Other     Current Outpatient Medications  Medication Sig Dispense Refill  . allopurinol (ZYLOPRIM) 300 MG tablet Take 300 mg by  mouth daily.    Marland Kitchen amLODipine (NORVASC) 10 MG tablet Take 10 mg by mouth daily.    Marland Kitchen aspirin 81 MG tablet Take 81 mg by mouth daily.    Marland Kitchen atorvastatin (LIPITOR) 40 MG tablet Take 40 mg by mouth daily.    . carvedilol (COREG) 25 MG tablet Take 25 mg by mouth 2 (two) times daily with a meal.    . Cholecalciferol (VITAMIN D3) 5000 units CAPS Take 5,000 Units by mouth daily.    . colchicine 0.6 MG tablet Take 1 tablet (0.6 mg total) by mouth 2 (two) times daily. 30 tablet 1  . furosemide (LASIX) 40 MG tablet Take 40-80 mg by mouth See admin instructions. Take 80 mg in the morning and 40 mg in the evening    . HYDROcodone-acetaminophen (NORCO) 5-325 MG tablet Take 1 tablet by mouth every 6 (six) hours as needed for moderate pain. 10 tablet 0  . insulin glargine (LANTUS) 100 UNIT/ML injection Inject 42 Units into the skin at bedtime.     No current facility-administered medications for this visit.    No Known Allergies   REVIEW OF SYSTEMS:   '[X]'$  denotes positive finding, '[ ]'$  denotes negative finding Cardiac  Comments:  Chest pain or chest pressure:    Shortness of breath upon exertion:  Short of breath when lying flat:    Irregular heart rhythm:        Vascular    Pain in calf, thigh, or hip brought on by ambulation:    Pain in feet at night that wakes you up from your sleep:     Blood clot in your veins:    Leg swelling:         Pulmonary    Oxygen at home:    Productive cough:     Wheezing:         Neurologic    Sudden weakness in arms or legs:     Sudden numbness in arms or legs:     Sudden onset of difficulty speaking or slurred speech:    Temporary loss of vision in one eye:     Problems with dizziness:         Gastrointestinal    Blood in stool:     Vomited blood:         Genitourinary    Burning when urinating:     Blood in urine:        Psychiatric    Major depression:         Hematologic    Bleeding problems:    Problems with blood clotting too easily:         Skin    Rashes or ulcers:        Constitutional    Fever or chills:      PHYSICAL EXAMINATION:  Vitals:   08/22/20 1454  BP: (!) 159/80  Pulse: 76  Resp: 20  Temp: 98.3 F (36.8 C)  TempSrc: Temporal  SpO2: 97%  Weight: 274 lb 9.6 oz (124.6 kg)  Height: 6' (1.829 m)    General:  WDWN in NAD; vital signs documented above Gait: Not observed HENT: WNL, normocephalic Pulmonary: normal non-labored breathing Cardiac: regular HR Abdomen: soft, NT, no masses Skin: without rashes Vascular Exam/Pulses:  Right Left  Radial 2+ (normal) 2+ (normal)   Extremities: Left arm fistula with palpable thrill, this becomes more difficult to feel as he move into the mid upper arm Musculoskeletal: no muscle wasting or atrophy  Neurologic: A&O X 3;  No focal weakness or paresthesias are detected Psychiatric:  The pt has Normal affect.   Non-Invasive Vascular Imaging:   Adequate diameter of fistula throughout upper arm Path of fistula is greater than 6 mm from the surface of the skin from the distal upper arm to the proximal upper arm    ASSESSMENT/PLAN:: 75 y.o. male here 5 weeks status post left brachiocephalic fistula creation  -Patent brachiocephalic fistula with palpable thrill and no signs or symptoms of steal syndrome in left hand -Fistula appears to be maturing well however will likely be to deep to reliably cannulate on a regular basis -Plan will be for left arm fistula superficialization versus transposition with Dr. [name] in the next few weeks -Risk, benefits, and alternatives to access surgery were discussed.   -The patient is aware the risks include but are not limited to: bleeding, infection, steal syndrome, nerve damage, thrombosis, failure to mature, and need for additional procedures.   -The patient agrees to proceed with the procedure.    Kelly Ligas, PA-C [facility] [phone]  Clinic MD:   [name]

## 2020-08-26 ENCOUNTER — Encounter (HOSPITAL_COMMUNITY): Payer: Self-pay | Admitting: Vascular Surgery

## 2020-08-26 ENCOUNTER — Other Ambulatory Visit: Payer: Self-pay

## 2020-08-26 ENCOUNTER — Other Ambulatory Visit (HOSPITAL_COMMUNITY)
Admission: RE | Admit: 2020-08-26 | Discharge: 2020-08-26 | Disposition: A | Discharge status: Home / Self Care | Payer: Medicare Other | Source: Ambulatory Visit | Attending: Vascular Surgery | Admitting: Vascular Surgery

## 2020-08-26 DIAGNOSIS — Z20822 Contact with and (suspected) exposure to covid-19: Secondary | ICD-10-CM | POA: Insufficient documentation

## 2020-08-26 DIAGNOSIS — Z01812 Encounter for preprocedural laboratory examination: Secondary | ICD-10-CM | POA: Insufficient documentation

## 2020-08-26 LAB — SARS CORONAVIRUS 2 (TAT 6-24 HRS): SARS Coronavirus 2: NEGATIVE

## 2020-08-26 MED ORDER — DEXTROSE 5 % IV SOLN
3.0000 g | INTRAVENOUS | Status: DC
  Filled 2020-08-26: qty 3000

## 2020-08-26 NOTE — Progress Notes (Signed)
Kelly Dunn has type II diabetes, patient reports that CBGs are running around 230.  I intstructted Kelly Dunn to take 21 units of Lantus tonight. I instructed patient to check CBG after awaking and every 2 hours until arrival  to the hospital.  I Instructed patient if CBG is less than 70 to drink1/2 cup of a clear juice. Recheck CBG in 15 minutes if CBG is not over 70 call, pre- op desk at [phone] for further instructions.

## 2020-08-27 ENCOUNTER — Ambulatory Visit (HOSPITAL_COMMUNITY): Payer: Medicare Other | Admitting: Anesthesiology

## 2020-08-27 ENCOUNTER — Other Ambulatory Visit: Payer: Self-pay

## 2020-08-27 ENCOUNTER — Encounter (HOSPITAL_COMMUNITY): Payer: Self-pay | Admitting: Vascular Surgery

## 2020-08-27 ENCOUNTER — Ambulatory Visit (HOSPITAL_COMMUNITY)
Admission: RE | Admit: 2020-08-27 | Discharge: 2020-08-27 | Disposition: A | Discharge status: Home / Self Care | Payer: Medicare Other | Source: Ambulatory Visit | Attending: Vascular Surgery | Admitting: Vascular Surgery

## 2020-08-27 ENCOUNTER — Encounter (HOSPITAL_COMMUNITY)
Admission: RE | Disposition: A | Discharge status: Home / Self Care | Payer: Self-pay | Source: Ambulatory Visit | Attending: Vascular Surgery

## 2020-08-27 DIAGNOSIS — Z833 Family history of diabetes mellitus: Secondary | ICD-10-CM | POA: Insufficient documentation

## 2020-08-27 DIAGNOSIS — N186 End stage renal disease: Secondary | ICD-10-CM | POA: Diagnosis not present

## 2020-08-27 DIAGNOSIS — I129 Hypertensive chronic kidney disease with stage 1 through stage 4 chronic kidney disease, or unspecified chronic kidney disease: Secondary | ICD-10-CM | POA: Diagnosis not present

## 2020-08-27 DIAGNOSIS — X58XXXA Exposure to other specified factors, initial encounter: Secondary | ICD-10-CM | POA: Diagnosis not present

## 2020-08-27 DIAGNOSIS — Z79899 Other long term (current) drug therapy: Secondary | ICD-10-CM | POA: Insufficient documentation

## 2020-08-27 DIAGNOSIS — N184 Chronic kidney disease, stage 4 (severe): Secondary | ICD-10-CM | POA: Insufficient documentation

## 2020-08-27 DIAGNOSIS — E1321 Other specified diabetes mellitus with diabetic nephropathy: Secondary | ICD-10-CM

## 2020-08-27 DIAGNOSIS — I120 Hypertensive chronic kidney disease with stage 5 chronic kidney disease or end stage renal disease: Secondary | ICD-10-CM | POA: Diagnosis not present

## 2020-08-27 DIAGNOSIS — Z87891 Personal history of nicotine dependence: Secondary | ICD-10-CM | POA: Insufficient documentation

## 2020-08-27 DIAGNOSIS — T82898A Other specified complication of vascular prosthetic devices, implants and grafts, initial encounter: Secondary | ICD-10-CM | POA: Insufficient documentation

## 2020-08-27 DIAGNOSIS — Z794 Long term (current) use of insulin: Secondary | ICD-10-CM | POA: Diagnosis not present

## 2020-08-27 DIAGNOSIS — Z7982 Long term (current) use of aspirin: Secondary | ICD-10-CM | POA: Diagnosis not present

## 2020-08-27 DIAGNOSIS — Z8249 Family history of ischemic heart disease and other diseases of the circulatory system: Secondary | ICD-10-CM | POA: Diagnosis not present

## 2020-08-27 DIAGNOSIS — E1122 Type 2 diabetes mellitus with diabetic chronic kidney disease: Secondary | ICD-10-CM | POA: Diagnosis not present

## 2020-08-27 DIAGNOSIS — N185 Chronic kidney disease, stage 5: Secondary | ICD-10-CM | POA: Diagnosis not present

## 2020-08-27 HISTORY — DX: Endocrine disorder, unspecified: E34.9

## 2020-08-27 HISTORY — DX: Polyneuropathy in diseases classified elsewhere: E34.9

## 2020-08-27 HISTORY — DX: Dyspnea, unspecified: R06.00

## 2020-08-27 HISTORY — PX: FISTULA SUPERFICIALIZATION: SHX6341

## 2020-08-27 HISTORY — DX: Polyneuropathy in diseases classified elsewhere: G63

## 2020-08-27 LAB — POCT I-STAT, CHEM 8
BUN: 63 mg/dL — ABNORMAL HIGH (ref 8–23)
Calcium, Ion: 1.18 mmol/L (ref 1.15–1.40)
Chloride: 112 mmol/L — ABNORMAL HIGH (ref 98–111)
Creatinine, Ser: 4.5 mg/dL — ABNORMAL HIGH (ref 0.61–1.24)
Glucose, Bld: 73 mg/dL (ref 70–99)
HCT: 33 % — ABNORMAL LOW (ref 39.0–52.0)
Hemoglobin: 11.2 g/dL — ABNORMAL LOW (ref 13.0–17.0)
Potassium: 4.3 mmol/L (ref 3.5–5.1)
Sodium: 144 mmol/L (ref 135–145)
TCO2: 22 mmol/L (ref 22–32)

## 2020-08-27 LAB — GLUCOSE, CAPILLARY
Glucose-Capillary: 79 mg/dL (ref 70–99)
Glucose-Capillary: 77 mg/dL (ref 70–99)
Glucose-Capillary: 91 mg/dL (ref 70–99)
Glucose-Capillary: 120 mg/dL — ABNORMAL HIGH (ref 70–99)

## 2020-08-27 SURGERY — FISTULA SUPERFICIALIZATION
Anesthesia: Monitor Anesthesia Care | Site: Arm Upper | Laterality: Left

## 2020-08-27 MED ORDER — CHLORHEXIDINE GLUCONATE 4 % EX LIQD: 60.0000 mL | Freq: Once | CUTANEOUS | Status: DC

## 2020-08-27 MED ORDER — LIDOCAINE-EPINEPHRINE (PF) 1.5 %-1:200000 IJ SOLN
INTRAMUSCULAR | Status: DC | PRN
  Administered 2020-08-27: 30 mL via PERINEURAL

## 2020-08-27 MED ORDER — PROPOFOL 500 MG/50ML IV EMUL
INTRAVENOUS | Status: DC | PRN
  Administered 2020-08-27: 50 ug/kg/min via INTRAVENOUS

## 2020-08-27 MED ORDER — HYDRALAZINE HCL 20 MG/ML IJ SOLN
INTRAMUSCULAR | Status: AC
  Filled 2020-08-27: qty 1

## 2020-08-27 MED ORDER — FENTANYL CITRATE (PF) 100 MCG/2ML IJ SOLN
INTRAMUSCULAR | Status: AC
  Administered 2020-08-27: 50 ug via INTRAVENOUS
  Filled 2020-08-27: qty 2

## 2020-08-27 MED ORDER — MIDAZOLAM HCL 2 MG/2ML IJ SOLN: 1.0000 mg | Freq: Once | INTRAMUSCULAR | Status: AC

## 2020-08-27 MED ORDER — SODIUM CHLORIDE 0.9 % IV SOLN: INTRAVENOUS | Status: DC

## 2020-08-27 MED ORDER — FENTANYL CITRATE (PF) 100 MCG/2ML IJ SOLN
INTRAMUSCULAR | Status: AC
  Filled 2020-08-27: qty 2

## 2020-08-27 MED ORDER — ONDANSETRON HCL 4 MG/2ML IJ SOLN: 4.0000 mg | Freq: Once | INTRAMUSCULAR | Status: DC | PRN

## 2020-08-27 MED ORDER — ORAL CARE MOUTH RINSE: 15.0000 mL | Freq: Once | OROMUCOSAL | Status: AC

## 2020-08-27 MED ORDER — DEXAMETHASONE SODIUM PHOSPHATE 4 MG/ML IJ SOLN
INTRAMUSCULAR | Status: DC | PRN
  Administered 2020-08-27: 4 mg via INTRAVENOUS

## 2020-08-27 MED ORDER — LIDOCAINE HCL (PF) 1 % IJ SOLN
INTRAMUSCULAR | Status: AC
  Filled 2020-08-27: qty 30

## 2020-08-27 MED ORDER — SODIUM CHLORIDE 0.9 % IV SOLN
INTRAVENOUS | Status: AC
  Filled 2020-08-27: qty 1.2

## 2020-08-27 MED ORDER — FENTANYL CITRATE (PF) 250 MCG/5ML IJ SOLN
INTRAMUSCULAR | Status: AC
  Filled 2020-08-27: qty 5

## 2020-08-27 MED ORDER — ONDANSETRON HCL 4 MG/2ML IJ SOLN
INTRAMUSCULAR | Status: AC
  Filled 2020-08-27: qty 2

## 2020-08-27 MED ORDER — ACETAMINOPHEN 10 MG/ML IV SOLN: 1000.0000 mg | Freq: Once | INTRAVENOUS | Status: DC | PRN

## 2020-08-27 MED ORDER — HEPARIN SODIUM (PORCINE) 1000 UNIT/ML IJ SOLN
INTRAMUSCULAR | Status: AC
  Filled 2020-08-27: qty 1

## 2020-08-27 MED ORDER — FENTANYL CITRATE (PF) 100 MCG/2ML IJ SOLN
INTRAMUSCULAR | Status: DC | PRN
  Administered 2020-08-27: 50 ug via INTRAVENOUS

## 2020-08-27 MED ORDER — DEXAMETHASONE SODIUM PHOSPHATE 10 MG/ML IJ SOLN
INTRAMUSCULAR | Status: DC | PRN
  Administered 2020-08-27: 5 mg

## 2020-08-27 MED ORDER — 0.9 % SODIUM CHLORIDE (POUR BTL) OPTIME
TOPICAL | Status: DC | PRN
  Administered 2020-08-27: 1000 mL

## 2020-08-27 MED ORDER — SODIUM CHLORIDE 0.9 % IV SOLN
INTRAVENOUS | Status: DC | PRN
  Administered 2020-08-27: 500 mL

## 2020-08-27 MED ORDER — DEXAMETHASONE SODIUM PHOSPHATE 10 MG/ML IJ SOLN
INTRAMUSCULAR | Status: AC
  Filled 2020-08-27: qty 1

## 2020-08-27 MED ORDER — FENTANYL CITRATE (PF) 100 MCG/2ML IJ SOLN
25.0000 ug | INTRAMUSCULAR | Status: DC | PRN
  Administered 2020-08-27: 50 ug via INTRAVENOUS

## 2020-08-27 MED ORDER — FENTANYL CITRATE (PF) 100 MCG/2ML IJ SOLN: 50.0000 ug | Freq: Once | INTRAMUSCULAR | Status: AC

## 2020-08-27 MED ORDER — DEXTROSE 5 % IV SOLN
INTRAVENOUS | Status: DC | PRN
  Administered 2020-08-27: 3 g via INTRAVENOUS

## 2020-08-27 MED ORDER — ONDANSETRON HCL 4 MG/2ML IJ SOLN
INTRAMUSCULAR | Status: DC | PRN
  Administered 2020-08-27: 4 mg via INTRAVENOUS

## 2020-08-27 MED ORDER — OXYCODONE-ACETAMINOPHEN 5-325 MG PO TABS: 1.0000 | ORAL_TABLET | ORAL | 0 refills | Status: AC | PRN

## 2020-08-27 MED ORDER — CHLORHEXIDINE GLUCONATE 0.12 % MT SOLN
15.0000 mL | Freq: Once | OROMUCOSAL | Status: AC
Start: 2020-08-27 — End: 2020-08-27
  Administered 2020-08-27: 15 mL via OROMUCOSAL
  Filled 2020-08-27: qty 15

## 2020-08-27 MED ORDER — MIDAZOLAM HCL 2 MG/2ML IJ SOLN
INTRAMUSCULAR | Status: AC
  Administered 2020-08-27: 1 mg via INTRAVENOUS
  Filled 2020-08-27: qty 2

## 2020-08-27 SURGICAL SUPPLY — 36 items
ADH SKN CLS APL DERMABOND .7 (GAUZE/BANDAGES/DRESSINGS) ×1
AGENT HMST SPONGE THK3/8 (HEMOSTASIS)
ARMBAND PINK RESTRICT EXTREMIT (MISCELLANEOUS) ×2 IMPLANT
CANISTER SUCT 3000ML PPV (MISCELLANEOUS) ×2 IMPLANT
CANNULA VESSEL 3MM 2 BLNT TIP (CANNULA) ×1 IMPLANT
CLIP VESOCCLUDE MED 6/CT (CLIP) ×2 IMPLANT
CLIP VESOCCLUDE SM WIDE 6/CT (CLIP) ×2 IMPLANT
COVER PROBE W GEL 5X96 (DRAPES) IMPLANT
COVER WAND RF STERILE (DRAPES) ×2 IMPLANT
DERMABOND ADVANCED (GAUZE/BANDAGES/DRESSINGS) ×1
DERMABOND ADVANCED .7 DNX12 (GAUZE/BANDAGES/DRESSINGS) ×1 IMPLANT
DRAIN PENROSE 1/4X12 LTX STRL (WOUND CARE) IMPLANT
ELECT REM PT RETURN 9FT ADLT (ELECTROSURGICAL) ×2
ELECTRODE REM PT RTRN 9FT ADLT (ELECTROSURGICAL) ×1 IMPLANT
GLOVE BIO SURGEON STRL SZ7.5 (GLOVE) ×2 IMPLANT
GOWN STRL REUS W/ TWL LRG LVL3 (GOWN DISPOSABLE) ×3 IMPLANT
GOWN STRL REUS W/TWL LRG LVL3 (GOWN DISPOSABLE) ×6
HEMOSTAT SPONGE AVITENE ULTRA (HEMOSTASIS) IMPLANT
KIT BASIN OR (CUSTOM PROCEDURE TRAY) ×2 IMPLANT
KIT TURNOVER KIT B (KITS) ×2 IMPLANT
LOOP VESSEL MINI RED (MISCELLANEOUS) IMPLANT
NS IRRIG 1000ML POUR BTL (IV SOLUTION) ×2 IMPLANT
PACK CV ACCESS (CUSTOM PROCEDURE TRAY) ×2 IMPLANT
PAD ARMBOARD 7.5X6 YLW CONV (MISCELLANEOUS) ×4 IMPLANT
SUT PROLENE 5 0 C 1 24 (SUTURE) IMPLANT
SUT PROLENE 6 0 CC (SUTURE) ×1 IMPLANT
SUT PROLENE 7 0 BV 1 (SUTURE) ×2 IMPLANT
SUT SILK 2 0 (SUTURE) ×2
SUT SILK 2-0 18XBRD TIE 12 (SUTURE) IMPLANT
SUT VIC AB 3-0 SH 27 (SUTURE) ×6
SUT VIC AB 3-0 SH 27X BRD (SUTURE) ×1 IMPLANT
SUT VICRYL 4-0 PS2 18IN ABS (SUTURE) ×4 IMPLANT
TOWEL GREEN STERILE (TOWEL DISPOSABLE) ×2 IMPLANT
TUBE CONNECTING 12X1/4 (SUCTIONS) ×1 IMPLANT
UNDERPAD 30X36 HEAVY ABSORB (UNDERPADS AND DIAPERS) ×2 IMPLANT
WATER STERILE IRR 1000ML POUR (IV SOLUTION) ×2 IMPLANT

## 2020-08-27 NOTE — Anesthesia Procedure Notes (Addendum)
Procedure Name: MAC Date/Time: 08/27/2020 11:02 AM Performed by: Oletta Lamas, CRNA Pre-anesthesia Checklist: Emergency Drugs available, Patient identified, Suction available, Patient being monitored and Timeout performed Patient Re-evaluated:Patient Re-evaluated prior to induction Oxygen Delivery Method: Simple face mask

## 2020-08-27 NOTE — Anesthesia Postprocedure Evaluation (Signed)
Anesthesia Post Note  Patient: Kelly Dunn  Procedure(s) Performed: LEFT ARM ARTERIOVENOUS FISTULA SUPERFICIALIZATION (Left Arm Upper)     Patient location during evaluation: PACU Anesthesia Type: Regional and MAC Level of consciousness: awake and alert Pain management: pain level controlled Vital Signs Assessment: post-procedure vital signs reviewed and stable Respiratory status: spontaneous breathing, nonlabored ventilation, respiratory function stable and patient connected to nasal cannula oxygen Cardiovascular status: stable and blood pressure returned to baseline Postop Assessment: no apparent nausea or vomiting Anesthetic complications: no   No complications documented.  Last Vitals:  Vitals:   08/27/20 1328 08/27/20 1338  BP:  (!) 162/72  Pulse:  77  Resp:  17  Temp: 36.6 C   SpO2:  96%    Last Pain:  Vitals:   08/27/20 1323  TempSrc:   PainSc: Asleep                 March Rummage Jules Vidovich

## 2020-08-27 NOTE — Interval H&P Note (Signed)
History and Physical Interval Note:  08/27/2020 8:56 AM  Kelly Dunn  has presented today for surgery, with the diagnosis of ESRD.  The various methods of treatment have been discussed with the patient and family. After consideration of risks, benefits and other options for treatment, the patient has consented to  Procedure(s): LEFT ARM ARTERIOVENOUS FISTULA SUPERFICIALIZATION VERSUS TRANSPOSITION (Left) as a surgical intervention.  The patient's history has been reviewed, patient examined, no change in status, stable for surgery.  I have reviewed the patient's chart and labs.  Questions were answered to the patient's satisfaction.     Ruta Hinds

## 2020-08-27 NOTE — Anesthesia Preprocedure Evaluation (Addendum)
Anesthesia Evaluation  Patient identified by MRN, date of birth, ID band Patient awake    Reviewed: Allergy & Precautions, NPO status , Patient's Chart, lab work & pertinent test results  Airway Mallampati: II  TM Distance: >3 FB Neck ROM: Full    Dental  (+) Teeth Intact, Dental Advisory Given   Pulmonary neg pulmonary ROS, former smoker,    Pulmonary exam normal        Cardiovascular hypertension, Pt. on medications and Pt. on home beta blockers  Rhythm:Regular Rate:Normal     Neuro/Psych negative neurological ROS  negative psych ROS   GI/Hepatic negative GI ROS, Neg liver ROS,   Endo/Other  diabetes, Type 2, Insulin Dependent  Renal/GU CRFRenal disease     Musculoskeletal  (+) Arthritis ,   Abdominal (+)   Bowel sounds: normal.  Peds  Hematology negative hematology ROS (+)   Anesthesia Other Findings   Reproductive/Obstetrics                            Anesthesia Physical Anesthesia Plan  ASA: III  Anesthesia Plan: MAC and Regional   Post-op Pain Management:    Induction: Intravenous  PONV Risk Score and Plan: 1 and Ondansetron, Dexamethasone, Propofol infusion, Midazolam and Treatment may vary due to age or medical condition  Airway Management Planned: Simple Face Mask, Natural Airway and Nasal Cannula  Additional Equipment: None  Intra-op Plan:   Post-operative Plan:   Informed Consent: I have reviewed the patients History and Physical, chart, labs and discussed the procedure including the risks, benefits and alternatives for the proposed anesthesia with the patient or authorized representative who has indicated his/her understanding and acceptance.     Dental advisory given  Plan Discussed with: CRNA  Anesthesia Plan Comments:        Anesthesia Quick Evaluation

## 2020-08-27 NOTE — Op Note (Signed)
Procedure: Superficialization of left brachiocephalic AV fistula  Preoperative diagnosis: Poorly maturing AV fistula  Postoperative diagnosis: Same  Anesthesia: Regional block left arm with sedation  Assistant: Paul Half, PA-C  Operative findings: 6 mm brachial cephalic fistula  Operative details: After obtaining form consent, the patient was taken the operating.  The patient was placed in supine position on the operating room table.  The left upper extremities prepped and draped in usual sterile fashion.  Ultrasound was used to mark the course of the AV fistula.  I then proceeded to make 3 longitudinal incisions up the arm over the area of the fistula.  These were all carried down through the subcutaneous tissues down the level of fistula.  The fistula was dissected free circumferentially and all sidebranches ligated by between silk ties.  After the fistula had been completely mobilized from the antecubital area all the way to the shoulder I then proceeded to debride subcutaneous fat tissue from surrounding the incisions and under the skin bridges.  At this point the subcutaneous tissues were reapproximated using running 3-0 Vicryl suture.  The skin was closed with a 4-0 Vicryl subcuticular stitch.  Dermabond was applied.  The patient tolerated procedure well and there were no complications.  The instrument sponge and needle counts correct in the case.  The patient was taken the recovery room stable condition.  Ruta Hinds, MD [facility] [place] Office: [phone]

## 2020-08-27 NOTE — Anesthesia Procedure Notes (Signed)
Anesthesia Regional Block: Supraclavicular block   Pre-Anesthetic Checklist: ,, timeout performed, Correct Patient, Correct Site, Correct Laterality, Correct Procedure, Correct Position, site marked, Risks and benefits discussed,  Surgical consent,  Pre-op evaluation,  At surgeon's request and post-op pain management  Laterality: Left  Prep: Dura Prep       Needles:  Injection technique: Single-shot  Needle Type: Echogenic Stimulator Needle     Needle Length: 5cm  Needle Gauge: 20     Additional Needles:   Procedures:,,,, ultrasound used (permanent image in chart),,,,  Narrative:  Start time: 08/27/2020 9:20 AM End time: 08/27/2020 9:25 AM Injection made incrementally with aspirations every 5 mL.  Performed by: Personally  Anesthesiologist: Darral Dash, DO  Additional Notes: Patient identified. Risks/Benefits/Options discussed with patient including but not limited to bleeding, infection, nerve damage, failed block, incomplete pain control. Patient expressed understanding and wished to proceed. All questions were answered. Sterile technique was used throughout the entire procedure. Please see nursing notes for vital signs. Aspirated in 5cc intervals with injection for negative confirmation. Patient was given instructions on fall risk and not to get out of bed. All questions and concerns addressed with instructions to call with any issues or inadequate analgesia.

## 2020-08-27 NOTE — Discharge Instructions (Signed)
[facility]  Discharge Instructions  AV Fistula or Graft Surgery for Dialysis Access  Please refer to the following instructions for your post-procedure care. Your surgeon or physician assistant will discuss any changes with you.  Activity  You may drive the day following your surgery, if you are comfortable and no longer taking prescription pain medication. Resume full activity as the soreness in your incision resolves.  Bathing/Showering  You may shower after you go home. Keep your incision dry for 48 hours. Do not soak in a bathtub, hot tub, or swim until the incision heals completely. You may not shower if you have a hemodialysis catheter.  Incision Care  Clean your incision with mild soap and water after 48 hours. Pat the area dry with a clean towel. You do not need a bandage unless otherwise instructed. Do not apply any ointments or creams to your incision. You may have skin glue on your incision. Do not peel it off. It will come off on its own in about one week. Your arm may swell a bit after surgery. To reduce swelling use pillows to elevate your arm so it is above your heart. Your doctor will tell you if you need to lightly wrap your arm with an ACE bandage.  Diet  Resume your normal diet. There are not special food restrictions following this procedure. In order to heal from your surgery, it is CRITICAL to get adequate nutrition. Your body requires vitamins, minerals, and protein. Vegetables are the best source of vitamins and minerals. Vegetables also provide the perfect balance of protein. Processed food has little nutritional value, so try to avoid this.  Medications  Resume taking all of your medications. If your incision is causing pain, you may take over-the counter pain relievers such as acetaminophen (Tylenol). If you were prescribed a stronger pain medication, please be aware these medications can cause nausea and constipation. Prevent  nausea by taking the medication with a snack or meal. Avoid constipation by drinking plenty of fluids and eating foods with high amount of fiber, such as fruits, vegetables, and grains.  Do not take Tylenol if you are taking prescription pain medications.  Follow up Your surgeon may want to see you in the office following your access surgery. If so, this will be arranged at the time of your surgery.  Please call us immediately for any of the following conditions:  . Increased pain, redness, drainage (pus) from your incision site . Fever of 101 degrees or higher . Severe or worsening pain at your incision site . Hand pain or numbness. .  Reduce your risk of vascular disease:  . Stop smoking. If you would like help, call [facility] at 1-800-QUIT-NOW (573)237-5738) or [place] at (539)101-3166  . Manage your cholesterol . Maintain a desired weight . Control your diabetes . Keep your blood pressure down  Dialysis  It will take several weeks to several months for your new dialysis access to be ready for use. Your surgeon will determine when it is okay to use it. Your nephrologist will continue to direct your dialysis. You can continue to use your Permcath until your new access is ready for use.   08/27/2020 Elih Bennette [id number] Nov 10, 1945  Surgeon(s): Elam Dutch, MD  Procedure(s): LEFT ARM ARTERIOVENOUS FISTULA SUPERFICIALIZATION   May stick graft immediately   May stick graft on designated area only:   X Do not stick  Left AV fistula for 6 weeks  If you have any questions, please call the office at [phone].

## 2020-08-27 NOTE — Transfer of Care (Signed)
Immediate Anesthesia Transfer of Care Note  Patient: Kelly Dunn  Procedure(s) Performed: LEFT ARM ARTERIOVENOUS FISTULA SUPERFICIALIZATION (Left Arm Upper)  Patient Location: PACU  Anesthesia Type:MAC and Regional  Level of Consciousness: oriented and patient cooperative  Airway & Oxygen Therapy: Patient Spontanous Breathing and Patient connected to face mask oxygen  Post-op Assessment: Report given to RN and Post -op Vital signs reviewed and stable  Post vital signs: Reviewed  Last Vitals:  Vitals Value Taken Time  BP    Temp    Pulse    Resp    SpO2      Last Pain:  Vitals:   08/27/20 0824  TempSrc:   PainSc: 0-No pain         Complications: No complications documented.

## 2020-08-28 ENCOUNTER — Encounter (HOSPITAL_COMMUNITY): Payer: Self-pay | Admitting: Vascular Surgery

## 2020-09-04 DIAGNOSIS — I120 Hypertensive chronic kidney disease with stage 5 chronic kidney disease or end stage renal disease: Secondary | ICD-10-CM | POA: Diagnosis not present

## 2020-09-04 DIAGNOSIS — M109 Gout, unspecified: Secondary | ICD-10-CM | POA: Diagnosis not present

## 2020-09-04 DIAGNOSIS — N2581 Secondary hyperparathyroidism of renal origin: Secondary | ICD-10-CM | POA: Diagnosis not present

## 2020-09-04 DIAGNOSIS — N185 Chronic kidney disease, stage 5: Secondary | ICD-10-CM | POA: Diagnosis not present

## 2020-09-04 DIAGNOSIS — E1122 Type 2 diabetes mellitus with diabetic chronic kidney disease: Secondary | ICD-10-CM | POA: Diagnosis not present

## 2020-09-04 DIAGNOSIS — D649 Anemia, unspecified: Secondary | ICD-10-CM | POA: Diagnosis not present

## 2020-09-25 ENCOUNTER — Other Ambulatory Visit: Payer: Self-pay

## 2020-09-25 DIAGNOSIS — N184 Chronic kidney disease, stage 4 (severe): Secondary | ICD-10-CM

## 2020-10-10 ENCOUNTER — Other Ambulatory Visit: Payer: Self-pay

## 2020-10-10 ENCOUNTER — Ambulatory Visit (INDEPENDENT_AMBULATORY_CARE_PROVIDER_SITE_OTHER): Payer: Medicare Other | Admitting: Physician Assistant

## 2020-10-10 ENCOUNTER — Other Ambulatory Visit: Payer: Self-pay | Admitting: *Deleted

## 2020-10-10 ENCOUNTER — Ambulatory Visit (HOSPITAL_COMMUNITY)
Admission: RE | Admit: 2020-10-10 | Discharge: 2020-10-10 | Disposition: A | Discharge status: Home / Self Care | Payer: Medicare Other | Source: Ambulatory Visit | Attending: Vascular Surgery | Admitting: Vascular Surgery

## 2020-10-10 VITALS — BP 170/78 | HR 71 | Temp 98.0°F | Resp 20 | Ht 72.0 in | Wt 276.6 lb

## 2020-10-10 DIAGNOSIS — N184 Chronic kidney disease, stage 4 (severe): Secondary | ICD-10-CM

## 2020-10-10 NOTE — Progress Notes (Signed)
POST OPERATIVE OFFICE NOTE    CC:  F/u for surgery  HPI:  This is a 75 y.o. male who is s/p superficialization of left BC AVF on 08/27/2020 by Dr. [name] for poorly maturing AV fistula.    Pt states he does not have pain/numbness in left hand with the exception of some numbness around the thenar area.    The pt is not on dialysis and his nephrologist is Dr. Hollie Salk.    No Known Allergies  Current Outpatient Medications  Medication Sig Dispense Refill  . allopurinol (ZYLOPRIM) 300 MG tablet Take 300 mg by mouth daily.    Marland Kitchen amLODipine (NORVASC) 10 MG tablet Take 10 mg by mouth daily.    Marland Kitchen aspirin 81 MG tablet Take 81 mg by mouth daily.    Marland Kitchen atorvastatin (LIPITOR) 40 MG tablet Take 40 mg by mouth daily.    . carvedilol (COREG) 25 MG tablet Take 25 mg by mouth 2 (two) times daily with a meal.    . Cholecalciferol (VITAMIN D3) 5000 units CAPS Take 5,000 Units by mouth daily.    . colchicine 0.6 MG tablet Take 1 tablet (0.6 mg total) by mouth 2 (two) times daily. 30 tablet 1  . furosemide (LASIX) 40 MG tablet Take 40-80 mg by mouth See admin instructions. Take 80 mg in the morning and 40 mg in the evening    . insulin glargine (LANTUS) 100 UNIT/ML injection Inject 42 Units into the skin at bedtime.    Marland Kitchen oxyCODONE-acetaminophen (PERCOCET) 5-325 MG tablet Take 1 tablet by mouth every 4 (four) hours as needed for severe pain. 20 tablet 0   No current facility-administered medications for this visit.     ROS:  See HPI  Physical Exam:  Today's Vitals   10/10/20 1123  BP: (!) 170/78  Pulse: 71  Resp: 20  Temp: 98 F (36.7 C)  TempSrc: Temporal  SpO2: 97%  Weight: 276 lb 9.6 oz (125.5 kg)  Height: 6' (1.829 m)   Body mass index is 37.51 kg/m.   Incision:  All incisions have healed nicely. Extremities:   There is a palpable left radial pulse.   Motor and sensory are in tact.   There is a thrill/bruit present.  The fistula is easily palpable   Dialysis Duplex on  10/10/2020: Findings:  +--------------------+----------+-----------------+--------+  AVF         PSV (cm/s)Flow Vol (mL/min)Comments  +--------------------+----------+-----------------+--------+  Native artery inflow  187     1220          +--------------------+----------+-----------------+--------+  AVF Anastomosis     424                 +--------------------+----------+-----------------+--------+    +------------+----------+-------------+----------+-------------------+  OUTFLOW VEINPSV (cm/s)Diameter (cm)Depth (cm)   Describe     +------------+----------+-------------+----------+-------------------+  Prox UA     121    0.65     0.78              +------------+----------+-------------+----------+-------------------+  Mid UA     216    0.93     0.65              +------------+----------+-------------+----------+-------------------+  Dist UA     800    0.34     1.54  Surrounded by fluid  +------------+----------+-------------+----------+-------------------+  AC Fossa    377    0.73     0.30              +------------+----------+-------------+----------+-------------------+  Velocities in the distal  upper arm segment of the vein increase from 120  cm/sec to >800 cm/sec   Summary:  Large anechoic fluid collection seen surrounding the distal upper arm  segment of the cephalic vein. Within this segment there is a focal area  consistent with >50% stenosis.   Assessment/Plan:  This is a 75 y.o. male who is s/p: superficialization of left BC AVF on 08/27/2020 by Dr. [name] for poorly maturing AV fistula.   -the pt does not have evidence of steal. -the fistula has a focal area of narrowing in the distal portion.  There is some fluid around the fistula at the narrowed area, which is most likely hematoma or some lymphatics.  The body  should absorb this over time.  The rest of the fistula is of adequate size.  Given he is not yet on HD, would not schedule for fistulogram at this time.   -discussed with pt that we will bring him back in 6-8 weeks on Dr. [name] clinic day with repeat duplex.  Also discussed that if he starts HD, he may require a TDC if his fistula is not able to be accessed.  Also discussed with him that fistulas do not last forever and will need maintanence or even new access in the future.   -he will call sooner if he has any issues.    Leontine Locket, [facility] [phone]  Clinic MD:  Scot Dock

## 2020-10-11 ENCOUNTER — Other Ambulatory Visit: Payer: Self-pay | Admitting: *Deleted

## 2020-10-11 DIAGNOSIS — N184 Chronic kidney disease, stage 4 (severe): Secondary | ICD-10-CM

## 2020-11-11 DIAGNOSIS — E1122 Type 2 diabetes mellitus with diabetic chronic kidney disease: Secondary | ICD-10-CM | POA: Diagnosis not present

## 2020-11-11 DIAGNOSIS — M109 Gout, unspecified: Secondary | ICD-10-CM | POA: Diagnosis not present

## 2020-11-11 DIAGNOSIS — D649 Anemia, unspecified: Secondary | ICD-10-CM | POA: Diagnosis not present

## 2020-11-11 DIAGNOSIS — N185 Chronic kidney disease, stage 5: Secondary | ICD-10-CM | POA: Diagnosis not present

## 2020-11-11 DIAGNOSIS — I120 Hypertensive chronic kidney disease with stage 5 chronic kidney disease or end stage renal disease: Secondary | ICD-10-CM | POA: Diagnosis not present

## 2020-11-11 DIAGNOSIS — R600 Localized edema: Secondary | ICD-10-CM | POA: Diagnosis not present

## 2020-11-11 DIAGNOSIS — N2581 Secondary hyperparathyroidism of renal origin: Secondary | ICD-10-CM | POA: Diagnosis not present

## 2020-11-21 ENCOUNTER — Ambulatory Visit (INDEPENDENT_AMBULATORY_CARE_PROVIDER_SITE_OTHER): Payer: Medicare Other | Admitting: Physician Assistant

## 2020-11-21 ENCOUNTER — Ambulatory Visit (HOSPITAL_COMMUNITY)
Admission: RE | Admit: 2020-11-21 | Discharge: 2020-11-21 | Disposition: A | Discharge status: Home / Self Care | Payer: Medicare Other | Source: Ambulatory Visit | Attending: Vascular Surgery | Admitting: Vascular Surgery

## 2020-11-21 ENCOUNTER — Other Ambulatory Visit: Payer: Self-pay

## 2020-11-21 VITALS — BP 141/64 | HR 68 | Temp 97.7°F | Resp 20 | Ht 72.0 in | Wt 279.1 lb

## 2020-11-21 DIAGNOSIS — N184 Chronic kidney disease, stage 4 (severe): Secondary | ICD-10-CM | POA: Diagnosis not present

## 2020-11-21 NOTE — Progress Notes (Signed)
    Postoperative Access Visit   History of Present Illness   Zayan Seppala is a 75 y.o. year old male who presents for postoperative follow-up for: left  superficialization of brachiocephalic fistula on 0000000 by Dr. [name] .  The patient's wounds are healed.  The patient denies steal symptoms.  The patient is able to complete their activities of daily living.  He is not yet on HD.   Physical Examination   Vitals:   11/21/20 0939  BP: (!) 141/64  Pulse: 68  Resp: 20  Temp: 97.7 F (36.5 C)  TempSrc: Temporal  SpO2: 96%  Weight: 279 lb 1.6 oz (126.6 kg)  Height: 6' (1.829 m)   Body mass index is 37.85 kg/m.  left arm Incisions are healed, palpable radial pulse, hand grip is 5/5, sensation in digits is intact,palpable thrill, bruit can be auscultated     Medical Decision Making   Devane Grannan is a 75 y.o. year old male who presents s/p left  arm superficialization of brachiocephalic fistula  Patent L AVF without signs or symptoms of steal syndrome The patient's access is ready for use in the event HD is initiated The patient may follow up on a prn basis   [name] PA-C [facility] [place] Office: [phone]  Clinic MD: [name]

## 2021-01-01 DIAGNOSIS — D649 Anemia, unspecified: Secondary | ICD-10-CM | POA: Diagnosis not present

## 2021-01-01 DIAGNOSIS — I120 Hypertensive chronic kidney disease with stage 5 chronic kidney disease or end stage renal disease: Secondary | ICD-10-CM | POA: Diagnosis not present

## 2021-01-01 DIAGNOSIS — E785 Hyperlipidemia, unspecified: Secondary | ICD-10-CM | POA: Diagnosis not present

## 2021-01-01 DIAGNOSIS — E1122 Type 2 diabetes mellitus with diabetic chronic kidney disease: Secondary | ICD-10-CM | POA: Diagnosis not present

## 2021-01-01 DIAGNOSIS — N2581 Secondary hyperparathyroidism of renal origin: Secondary | ICD-10-CM | POA: Diagnosis not present

## 2021-01-01 DIAGNOSIS — N185 Chronic kidney disease, stage 5: Secondary | ICD-10-CM | POA: Diagnosis not present

## 2021-03-03 DIAGNOSIS — I120 Hypertensive chronic kidney disease with stage 5 chronic kidney disease or end stage renal disease: Secondary | ICD-10-CM | POA: Diagnosis not present

## 2021-03-03 DIAGNOSIS — E1122 Type 2 diabetes mellitus with diabetic chronic kidney disease: Secondary | ICD-10-CM | POA: Diagnosis not present

## 2021-03-03 DIAGNOSIS — Z23 Encounter for immunization: Secondary | ICD-10-CM | POA: Diagnosis not present

## 2021-03-03 DIAGNOSIS — N2581 Secondary hyperparathyroidism of renal origin: Secondary | ICD-10-CM | POA: Diagnosis not present

## 2021-03-03 DIAGNOSIS — N185 Chronic kidney disease, stage 5: Secondary | ICD-10-CM | POA: Diagnosis not present

## 2021-03-03 DIAGNOSIS — D649 Anemia, unspecified: Secondary | ICD-10-CM | POA: Diagnosis not present

## 2021-03-26 DIAGNOSIS — E1121 Type 2 diabetes mellitus with diabetic nephropathy: Secondary | ICD-10-CM | POA: Diagnosis not present

## 2021-03-26 DIAGNOSIS — I10 Essential (primary) hypertension: Secondary | ICD-10-CM | POA: Diagnosis not present

## 2021-03-26 DIAGNOSIS — N2581 Secondary hyperparathyroidism of renal origin: Secondary | ICD-10-CM | POA: Diagnosis not present

## 2021-03-26 DIAGNOSIS — N184 Chronic kidney disease, stage 4 (severe): Secondary | ICD-10-CM | POA: Diagnosis not present

## 2021-03-26 DIAGNOSIS — E7800 Pure hypercholesterolemia, unspecified: Secondary | ICD-10-CM | POA: Diagnosis not present

## 2021-03-26 DIAGNOSIS — Z0001 Encounter for general adult medical examination with abnormal findings: Secondary | ICD-10-CM | POA: Diagnosis not present

## 2021-03-26 DIAGNOSIS — M1A40X0 Other secondary chronic gout, unspecified site, without tophus (tophi): Secondary | ICD-10-CM | POA: Diagnosis not present

## 2021-03-26 DIAGNOSIS — Z79899 Other long term (current) drug therapy: Secondary | ICD-10-CM | POA: Diagnosis not present

## 2021-03-26 DIAGNOSIS — Z1211 Encounter for screening for malignant neoplasm of colon: Secondary | ICD-10-CM | POA: Diagnosis not present

## 2021-05-12 DIAGNOSIS — D649 Anemia, unspecified: Secondary | ICD-10-CM | POA: Diagnosis not present

## 2021-05-12 DIAGNOSIS — N2581 Secondary hyperparathyroidism of renal origin: Secondary | ICD-10-CM | POA: Diagnosis not present

## 2021-05-12 DIAGNOSIS — M109 Gout, unspecified: Secondary | ICD-10-CM | POA: Diagnosis not present

## 2021-05-12 DIAGNOSIS — N185 Chronic kidney disease, stage 5: Secondary | ICD-10-CM | POA: Diagnosis not present

## 2021-05-12 DIAGNOSIS — I120 Hypertensive chronic kidney disease with stage 5 chronic kidney disease or end stage renal disease: Secondary | ICD-10-CM | POA: Diagnosis not present

## 2021-05-12 DIAGNOSIS — E1122 Type 2 diabetes mellitus with diabetic chronic kidney disease: Secondary | ICD-10-CM | POA: Diagnosis not present

## 2021-05-13 DIAGNOSIS — N185 Chronic kidney disease, stage 5: Secondary | ICD-10-CM | POA: Diagnosis not present

## 2021-05-13 DIAGNOSIS — D649 Anemia, unspecified: Secondary | ICD-10-CM | POA: Diagnosis not present

## 2021-06-26 DIAGNOSIS — I120 Hypertensive chronic kidney disease with stage 5 chronic kidney disease or end stage renal disease: Secondary | ICD-10-CM | POA: Diagnosis not present

## 2021-06-26 DIAGNOSIS — D649 Anemia, unspecified: Secondary | ICD-10-CM | POA: Diagnosis not present

## 2021-06-26 DIAGNOSIS — M109 Gout, unspecified: Secondary | ICD-10-CM | POA: Diagnosis not present

## 2021-06-26 DIAGNOSIS — N185 Chronic kidney disease, stage 5: Secondary | ICD-10-CM | POA: Diagnosis not present

## 2021-06-26 DIAGNOSIS — N2581 Secondary hyperparathyroidism of renal origin: Secondary | ICD-10-CM | POA: Diagnosis not present

## 2021-06-26 DIAGNOSIS — E1122 Type 2 diabetes mellitus with diabetic chronic kidney disease: Secondary | ICD-10-CM | POA: Diagnosis not present

## 2021-09-11 DIAGNOSIS — D649 Anemia, unspecified: Secondary | ICD-10-CM | POA: Diagnosis not present

## 2021-09-11 DIAGNOSIS — N185 Chronic kidney disease, stage 5: Secondary | ICD-10-CM | POA: Diagnosis not present

## 2021-09-11 DIAGNOSIS — E1122 Type 2 diabetes mellitus with diabetic chronic kidney disease: Secondary | ICD-10-CM | POA: Diagnosis not present

## 2021-09-11 DIAGNOSIS — N2581 Secondary hyperparathyroidism of renal origin: Secondary | ICD-10-CM | POA: Diagnosis not present

## 2021-09-11 DIAGNOSIS — I120 Hypertensive chronic kidney disease with stage 5 chronic kidney disease or end stage renal disease: Secondary | ICD-10-CM | POA: Diagnosis not present

## 2021-11-19 DIAGNOSIS — I120 Hypertensive chronic kidney disease with stage 5 chronic kidney disease or end stage renal disease: Secondary | ICD-10-CM | POA: Diagnosis not present

## 2021-11-19 DIAGNOSIS — D649 Anemia, unspecified: Secondary | ICD-10-CM | POA: Diagnosis not present

## 2021-11-19 DIAGNOSIS — N2581 Secondary hyperparathyroidism of renal origin: Secondary | ICD-10-CM | POA: Diagnosis not present

## 2021-11-19 DIAGNOSIS — M109 Gout, unspecified: Secondary | ICD-10-CM | POA: Diagnosis not present

## 2021-11-19 DIAGNOSIS — E1122 Type 2 diabetes mellitus with diabetic chronic kidney disease: Secondary | ICD-10-CM | POA: Diagnosis not present

## 2021-11-19 DIAGNOSIS — N185 Chronic kidney disease, stage 5: Secondary | ICD-10-CM | POA: Diagnosis not present

## 2021-12-25 DIAGNOSIS — Z79899 Other long term (current) drug therapy: Secondary | ICD-10-CM | POA: Diagnosis not present

## 2021-12-25 DIAGNOSIS — N2581 Secondary hyperparathyroidism of renal origin: Secondary | ICD-10-CM | POA: Diagnosis not present

## 2021-12-25 DIAGNOSIS — E1121 Type 2 diabetes mellitus with diabetic nephropathy: Secondary | ICD-10-CM | POA: Diagnosis not present

## 2021-12-25 DIAGNOSIS — E7800 Pure hypercholesterolemia, unspecified: Secondary | ICD-10-CM | POA: Diagnosis not present

## 2021-12-25 DIAGNOSIS — N184 Chronic kidney disease, stage 4 (severe): Secondary | ICD-10-CM | POA: Diagnosis not present

## 2021-12-25 DIAGNOSIS — M1A40X0 Other secondary chronic gout, unspecified site, without tophus (tophi): Secondary | ICD-10-CM | POA: Diagnosis not present

## 2021-12-25 DIAGNOSIS — I10 Essential (primary) hypertension: Secondary | ICD-10-CM | POA: Diagnosis not present

## 2022-01-23 DIAGNOSIS — I120 Hypertensive chronic kidney disease with stage 5 chronic kidney disease or end stage renal disease: Secondary | ICD-10-CM | POA: Diagnosis not present

## 2022-01-23 DIAGNOSIS — N2581 Secondary hyperparathyroidism of renal origin: Secondary | ICD-10-CM | POA: Diagnosis not present

## 2022-01-23 DIAGNOSIS — D649 Anemia, unspecified: Secondary | ICD-10-CM | POA: Diagnosis not present

## 2022-01-23 DIAGNOSIS — N185 Chronic kidney disease, stage 5: Secondary | ICD-10-CM | POA: Diagnosis not present

## 2022-01-23 DIAGNOSIS — E1122 Type 2 diabetes mellitus with diabetic chronic kidney disease: Secondary | ICD-10-CM | POA: Diagnosis not present

## 2022-01-23 DIAGNOSIS — M109 Gout, unspecified: Secondary | ICD-10-CM | POA: Diagnosis not present

## 2022-02-02 DIAGNOSIS — E119 Type 2 diabetes mellitus without complications: Secondary | ICD-10-CM | POA: Diagnosis not present

## 2022-03-27 DIAGNOSIS — E1122 Type 2 diabetes mellitus with diabetic chronic kidney disease: Secondary | ICD-10-CM | POA: Diagnosis not present

## 2022-03-27 DIAGNOSIS — N185 Chronic kidney disease, stage 5: Secondary | ICD-10-CM | POA: Diagnosis not present

## 2022-03-27 DIAGNOSIS — I120 Hypertensive chronic kidney disease with stage 5 chronic kidney disease or end stage renal disease: Secondary | ICD-10-CM | POA: Diagnosis not present

## 2022-03-27 DIAGNOSIS — N2581 Secondary hyperparathyroidism of renal origin: Secondary | ICD-10-CM | POA: Diagnosis not present

## 2022-03-27 DIAGNOSIS — M109 Gout, unspecified: Secondary | ICD-10-CM | POA: Diagnosis not present

## 2022-03-27 DIAGNOSIS — D649 Anemia, unspecified: Secondary | ICD-10-CM | POA: Diagnosis not present

## 2022-06-03 DIAGNOSIS — N185 Chronic kidney disease, stage 5: Secondary | ICD-10-CM | POA: Diagnosis not present

## 2022-06-03 DIAGNOSIS — D649 Anemia, unspecified: Secondary | ICD-10-CM | POA: Diagnosis not present

## 2022-06-03 DIAGNOSIS — N2581 Secondary hyperparathyroidism of renal origin: Secondary | ICD-10-CM | POA: Diagnosis not present

## 2022-06-03 DIAGNOSIS — I120 Hypertensive chronic kidney disease with stage 5 chronic kidney disease or end stage renal disease: Secondary | ICD-10-CM | POA: Diagnosis not present

## 2022-06-03 DIAGNOSIS — E1122 Type 2 diabetes mellitus with diabetic chronic kidney disease: Secondary | ICD-10-CM | POA: Diagnosis not present

## 2022-08-10 DIAGNOSIS — D649 Anemia, unspecified: Secondary | ICD-10-CM | POA: Diagnosis not present

## 2022-08-10 DIAGNOSIS — N185 Chronic kidney disease, stage 5: Secondary | ICD-10-CM | POA: Diagnosis not present

## 2022-08-10 DIAGNOSIS — N2581 Secondary hyperparathyroidism of renal origin: Secondary | ICD-10-CM | POA: Diagnosis not present

## 2022-08-10 DIAGNOSIS — I120 Hypertensive chronic kidney disease with stage 5 chronic kidney disease or end stage renal disease: Secondary | ICD-10-CM | POA: Diagnosis not present

## 2022-08-10 DIAGNOSIS — E1122 Type 2 diabetes mellitus with diabetic chronic kidney disease: Secondary | ICD-10-CM | POA: Diagnosis not present

## 2022-08-11 DIAGNOSIS — R799 Abnormal finding of blood chemistry, unspecified: Secondary | ICD-10-CM | POA: Diagnosis not present

## 2022-10-14 DIAGNOSIS — E1122 Type 2 diabetes mellitus with diabetic chronic kidney disease: Secondary | ICD-10-CM | POA: Diagnosis not present

## 2022-10-14 DIAGNOSIS — D649 Anemia, unspecified: Secondary | ICD-10-CM | POA: Diagnosis not present

## 2022-10-14 DIAGNOSIS — N185 Chronic kidney disease, stage 5: Secondary | ICD-10-CM | POA: Diagnosis not present

## 2022-10-14 DIAGNOSIS — N2581 Secondary hyperparathyroidism of renal origin: Secondary | ICD-10-CM | POA: Diagnosis not present

## 2022-10-14 DIAGNOSIS — I120 Hypertensive chronic kidney disease with stage 5 chronic kidney disease or end stage renal disease: Secondary | ICD-10-CM | POA: Diagnosis not present

## 2022-12-16 DIAGNOSIS — N2581 Secondary hyperparathyroidism of renal origin: Secondary | ICD-10-CM | POA: Diagnosis not present

## 2022-12-16 DIAGNOSIS — I120 Hypertensive chronic kidney disease with stage 5 chronic kidney disease or end stage renal disease: Secondary | ICD-10-CM | POA: Diagnosis not present

## 2022-12-16 DIAGNOSIS — N185 Chronic kidney disease, stage 5: Secondary | ICD-10-CM | POA: Diagnosis not present

## 2022-12-16 DIAGNOSIS — D649 Anemia, unspecified: Secondary | ICD-10-CM | POA: Diagnosis not present

## 2022-12-16 DIAGNOSIS — M109 Gout, unspecified: Secondary | ICD-10-CM | POA: Diagnosis not present

## 2022-12-16 DIAGNOSIS — E1122 Type 2 diabetes mellitus with diabetic chronic kidney disease: Secondary | ICD-10-CM | POA: Diagnosis not present

## 2022-12-28 DIAGNOSIS — I129 Hypertensive chronic kidney disease with stage 1 through stage 4 chronic kidney disease, or unspecified chronic kidney disease: Secondary | ICD-10-CM | POA: Diagnosis not present

## 2022-12-28 DIAGNOSIS — N185 Chronic kidney disease, stage 5: Secondary | ICD-10-CM | POA: Diagnosis not present

## 2023-01-13 DIAGNOSIS — E1122 Type 2 diabetes mellitus with diabetic chronic kidney disease: Secondary | ICD-10-CM | POA: Diagnosis not present

## 2023-01-13 DIAGNOSIS — N185 Chronic kidney disease, stage 5: Secondary | ICD-10-CM | POA: Diagnosis not present

## 2023-01-13 DIAGNOSIS — Z23 Encounter for immunization: Secondary | ICD-10-CM | POA: Diagnosis not present

## 2023-01-13 DIAGNOSIS — B079 Viral wart, unspecified: Secondary | ICD-10-CM | POA: Diagnosis not present

## 2023-01-13 DIAGNOSIS — Z0001 Encounter for general adult medical examination with abnormal findings: Secondary | ICD-10-CM | POA: Diagnosis not present

## 2023-01-13 DIAGNOSIS — D696 Thrombocytopenia, unspecified: Secondary | ICD-10-CM | POA: Diagnosis not present

## 2023-01-13 DIAGNOSIS — E7800 Pure hypercholesterolemia, unspecified: Secondary | ICD-10-CM | POA: Diagnosis not present

## 2023-01-13 DIAGNOSIS — E1121 Type 2 diabetes mellitus with diabetic nephropathy: Secondary | ICD-10-CM | POA: Diagnosis not present

## 2023-01-13 DIAGNOSIS — N2581 Secondary hyperparathyroidism of renal origin: Secondary | ICD-10-CM | POA: Diagnosis not present

## 2023-01-13 DIAGNOSIS — I120 Hypertensive chronic kidney disease with stage 5 chronic kidney disease or end stage renal disease: Secondary | ICD-10-CM | POA: Diagnosis not present

## 2023-01-13 DIAGNOSIS — Z79899 Other long term (current) drug therapy: Secondary | ICD-10-CM | POA: Diagnosis not present

## 2023-01-27 DIAGNOSIS — Z0001 Encounter for general adult medical examination with abnormal findings: Secondary | ICD-10-CM | POA: Diagnosis not present

## 2023-01-28 DIAGNOSIS — N185 Chronic kidney disease, stage 5: Secondary | ICD-10-CM | POA: Diagnosis not present

## 2023-01-28 DIAGNOSIS — E785 Hyperlipidemia, unspecified: Secondary | ICD-10-CM | POA: Diagnosis not present

## 2023-01-28 DIAGNOSIS — E1122 Type 2 diabetes mellitus with diabetic chronic kidney disease: Secondary | ICD-10-CM | POA: Diagnosis not present

## 2023-01-28 DIAGNOSIS — I120 Hypertensive chronic kidney disease with stage 5 chronic kidney disease or end stage renal disease: Secondary | ICD-10-CM | POA: Diagnosis not present

## 2023-01-28 DIAGNOSIS — D649 Anemia, unspecified: Secondary | ICD-10-CM | POA: Diagnosis not present

## 2023-02-09 DIAGNOSIS — B079 Viral wart, unspecified: Secondary | ICD-10-CM | POA: Diagnosis not present

## 2023-02-09 DIAGNOSIS — D492 Neoplasm of unspecified behavior of bone, soft tissue, and skin: Secondary | ICD-10-CM | POA: Diagnosis not present

## 2023-03-19 DIAGNOSIS — H2513 Age-related nuclear cataract, bilateral: Secondary | ICD-10-CM | POA: Diagnosis not present

## 2023-03-19 DIAGNOSIS — E113291 Type 2 diabetes mellitus with mild nonproliferative diabetic retinopathy without macular edema, right eye: Secondary | ICD-10-CM | POA: Diagnosis not present

## 2023-03-19 DIAGNOSIS — H25013 Cortical age-related cataract, bilateral: Secondary | ICD-10-CM | POA: Diagnosis not present

## 2023-03-31 DIAGNOSIS — D649 Anemia, unspecified: Secondary | ICD-10-CM | POA: Diagnosis not present

## 2023-03-31 DIAGNOSIS — M109 Gout, unspecified: Secondary | ICD-10-CM | POA: Diagnosis not present

## 2023-03-31 DIAGNOSIS — E1122 Type 2 diabetes mellitus with diabetic chronic kidney disease: Secondary | ICD-10-CM | POA: Diagnosis not present

## 2023-03-31 DIAGNOSIS — I120 Hypertensive chronic kidney disease with stage 5 chronic kidney disease or end stage renal disease: Secondary | ICD-10-CM | POA: Diagnosis not present

## 2023-03-31 DIAGNOSIS — N2581 Secondary hyperparathyroidism of renal origin: Secondary | ICD-10-CM | POA: Diagnosis not present

## 2023-03-31 DIAGNOSIS — N185 Chronic kidney disease, stage 5: Secondary | ICD-10-CM | POA: Diagnosis not present

## 2023-05-03 DIAGNOSIS — N185 Chronic kidney disease, stage 5: Secondary | ICD-10-CM | POA: Diagnosis not present

## 2023-05-05 DIAGNOSIS — M109 Gout, unspecified: Secondary | ICD-10-CM | POA: Diagnosis not present

## 2023-05-05 DIAGNOSIS — N2581 Secondary hyperparathyroidism of renal origin: Secondary | ICD-10-CM | POA: Diagnosis not present

## 2023-05-05 DIAGNOSIS — N185 Chronic kidney disease, stage 5: Secondary | ICD-10-CM | POA: Diagnosis not present

## 2023-05-05 DIAGNOSIS — E1122 Type 2 diabetes mellitus with diabetic chronic kidney disease: Secondary | ICD-10-CM | POA: Diagnosis not present

## 2023-05-05 DIAGNOSIS — D649 Anemia, unspecified: Secondary | ICD-10-CM | POA: Diagnosis not present

## 2023-05-05 DIAGNOSIS — I120 Hypertensive chronic kidney disease with stage 5 chronic kidney disease or end stage renal disease: Secondary | ICD-10-CM | POA: Diagnosis not present

## 2023-05-17 ENCOUNTER — Ambulatory Visit (HOSPITAL_COMMUNITY)
Admission: RE | Admit: 2023-05-17 | Discharge: 2023-05-17 | Disposition: A | Discharge status: Home / Self Care | Payer: Medicare Other | Source: Ambulatory Visit | Attending: Nephrology | Admitting: Nephrology

## 2023-05-17 VITALS — BP 129/52 | HR 71 | Temp 97.7°F | Resp 18

## 2023-05-17 DIAGNOSIS — N184 Chronic kidney disease, stage 4 (severe): Secondary | ICD-10-CM | POA: Diagnosis not present

## 2023-05-17 LAB — POCT HEMOGLOBIN-HEMACUE: Hemoglobin: 8.8 g/dL — ABNORMAL LOW (ref 13.0–17.0)

## 2023-05-17 MED ORDER — EPOETIN ALFA-EPBX 10000 UNIT/ML IJ SOLN: 10000.0000 [IU] | INTRAMUSCULAR | Status: DC

## 2023-05-17 MED ORDER — EPOETIN ALFA-EPBX 10000 UNIT/ML IJ SOLN
INTRAMUSCULAR | Status: AC
  Administered 2023-05-17: 10000 [IU] via SUBCUTANEOUS
  Filled 2023-05-17: qty 1

## 2023-05-31 ENCOUNTER — Encounter (HOSPITAL_COMMUNITY): Payer: Medicare Other

## 2023-06-02 ENCOUNTER — Ambulatory Visit (HOSPITAL_COMMUNITY)
Admission: RE | Admit: 2023-06-02 | Discharge: 2023-06-02 | Disposition: A | Discharge status: Home / Self Care | Payer: Medicare Other | Source: Ambulatory Visit | Attending: Nephrology | Admitting: Nephrology

## 2023-06-02 VITALS — BP 137/57 | HR 68 | Temp 97.1°F | Resp 18

## 2023-06-02 DIAGNOSIS — N184 Chronic kidney disease, stage 4 (severe): Secondary | ICD-10-CM | POA: Insufficient documentation

## 2023-06-02 LAB — POCT HEMOGLOBIN-HEMACUE: Hemoglobin: 9.7 g/dL — ABNORMAL LOW (ref 13.0–17.0)

## 2023-06-02 MED ORDER — EPOETIN ALFA-EPBX 10000 UNIT/ML IJ SOLN: 10000.0000 [IU] | INTRAMUSCULAR | Status: DC

## 2023-06-02 MED ORDER — EPOETIN ALFA-EPBX 10000 UNIT/ML IJ SOLN
INTRAMUSCULAR | Status: AC
  Administered 2023-06-02: 10000 [IU] via SUBCUTANEOUS
  Filled 2023-06-02: qty 1

## 2023-06-14 ENCOUNTER — Encounter (HOSPITAL_COMMUNITY): Payer: Medicare Other

## 2023-06-16 ENCOUNTER — Encounter (HOSPITAL_COMMUNITY): Payer: Medicare Other

## 2023-06-18 ENCOUNTER — Encounter (HOSPITAL_COMMUNITY)
Admission: RE | Admit: 2023-06-18 | Discharge: 2023-06-18 | Disposition: A | Discharge status: Home / Self Care | Payer: Medicare Other | Source: Ambulatory Visit | Attending: Nephrology | Admitting: Nephrology

## 2023-06-18 VITALS — BP 138/52 | HR 67 | Temp 96.4°F

## 2023-06-18 DIAGNOSIS — N184 Chronic kidney disease, stage 4 (severe): Secondary | ICD-10-CM | POA: Insufficient documentation

## 2023-06-18 LAB — RENAL FUNCTION PANEL
Albumin: 3.5 g/dL (ref 3.5–5.0)
Anion gap: 15 (ref 5–15)
BUN: 92 mg/dL — ABNORMAL HIGH (ref 8–23)
CO2: 25 mmol/L (ref 22–32)
Calcium: 9.7 mg/dL (ref 8.9–10.3)
Chloride: 103 mmol/L (ref 98–111)
Creatinine, Ser: 7.34 mg/dL — ABNORMAL HIGH (ref 0.61–1.24)
GFR, Estimated: 7 mL/min — ABNORMAL LOW (ref >60)
Glucose, Bld: 140 mg/dL — ABNORMAL HIGH (ref 70–99)
Phosphorus: 6 mg/dL — ABNORMAL HIGH (ref 2.5–4.6)
Potassium: 3.8 mmol/L (ref 3.5–5.1)
Sodium: 143 mmol/L (ref 135–145)

## 2023-06-18 LAB — IRON AND TIBC
Iron: 78 ug/dL (ref 45–182)
Saturation Ratios: 35 % (ref 17.9–39.5)
TIBC: 224 ug/dL — ABNORMAL LOW (ref 250–450)
UIBC: 146 ug/dL

## 2023-06-18 LAB — POCT HEMOGLOBIN-HEMACUE: Hemoglobin: 10.4 g/dL — ABNORMAL LOW (ref 13.0–17.0)

## 2023-06-18 LAB — FERRITIN: Ferritin: 245 ng/mL (ref 24–336)

## 2023-06-18 MED ORDER — EPOETIN ALFA-EPBX 10000 UNIT/ML IJ SOLN
10000.0000 [IU] | INTRAMUSCULAR | Status: DC
  Administered 2023-06-18: 10000 [IU] via SUBCUTANEOUS

## 2023-06-18 MED ORDER — EPOETIN ALFA-EPBX 10000 UNIT/ML IJ SOLN
INTRAMUSCULAR | Status: AC
  Filled 2023-06-18: qty 1

## 2023-06-30 ENCOUNTER — Encounter (HOSPITAL_COMMUNITY): Payer: Medicare Other

## 2023-07-01 DIAGNOSIS — D649 Anemia, unspecified: Secondary | ICD-10-CM | POA: Diagnosis not present

## 2023-07-01 DIAGNOSIS — E1122 Type 2 diabetes mellitus with diabetic chronic kidney disease: Secondary | ICD-10-CM | POA: Diagnosis not present

## 2023-07-01 DIAGNOSIS — M109 Gout, unspecified: Secondary | ICD-10-CM | POA: Diagnosis not present

## 2023-07-01 DIAGNOSIS — N185 Chronic kidney disease, stage 5: Secondary | ICD-10-CM | POA: Diagnosis not present

## 2023-07-01 DIAGNOSIS — N2581 Secondary hyperparathyroidism of renal origin: Secondary | ICD-10-CM | POA: Diagnosis not present

## 2023-07-01 DIAGNOSIS — I129 Hypertensive chronic kidney disease with stage 1 through stage 4 chronic kidney disease, or unspecified chronic kidney disease: Secondary | ICD-10-CM | POA: Diagnosis not present

## 2023-07-01 DIAGNOSIS — I120 Hypertensive chronic kidney disease with stage 5 chronic kidney disease or end stage renal disease: Secondary | ICD-10-CM | POA: Diagnosis not present

## 2023-07-02 ENCOUNTER — Ambulatory Visit (HOSPITAL_COMMUNITY)
Admission: RE | Admit: 2023-07-02 | Discharge: 2023-07-02 | Disposition: A | Discharge status: Home / Self Care | Payer: Medicare Other | Source: Ambulatory Visit | Attending: Nephrology | Admitting: Nephrology

## 2023-07-02 VITALS — BP 135/57 | HR 68 | Temp 98.6°F | Resp 17

## 2023-07-02 DIAGNOSIS — N184 Chronic kidney disease, stage 4 (severe): Secondary | ICD-10-CM | POA: Diagnosis not present

## 2023-07-02 LAB — POCT HEMOGLOBIN-HEMACUE: Hemoglobin: 10.5 g/dL — ABNORMAL LOW (ref 13.0–17.0)

## 2023-07-02 MED ORDER — EPOETIN ALFA-EPBX 10000 UNIT/ML IJ SOLN
10000.0000 [IU] | INTRAMUSCULAR | Status: DC
  Administered 2023-07-02: 10000 [IU] via SUBCUTANEOUS

## 2023-07-02 MED ORDER — EPOETIN ALFA-EPBX 10000 UNIT/ML IJ SOLN
INTRAMUSCULAR | Status: AC
  Filled 2023-07-02: qty 1

## 2023-07-16 ENCOUNTER — Ambulatory Visit (HOSPITAL_COMMUNITY)
Admission: RE | Admit: 2023-07-16 | Discharge: 2023-07-16 | Disposition: A | Discharge status: Home / Self Care | Payer: Medicare Other | Source: Ambulatory Visit | Attending: Nephrology | Admitting: Nephrology

## 2023-07-16 VITALS — BP 128/51 | HR 67 | Temp 97.0°F | Resp 17

## 2023-07-16 DIAGNOSIS — N184 Chronic kidney disease, stage 4 (severe): Secondary | ICD-10-CM

## 2023-07-16 LAB — RENAL FUNCTION PANEL
Albumin: 3.5 g/dL (ref 3.5–5.0)
Anion gap: 17 — ABNORMAL HIGH (ref 5–15)
BUN: 91 mg/dL — ABNORMAL HIGH (ref 8–23)
CO2: 24 mmol/L (ref 22–32)
Calcium: 9.4 mg/dL (ref 8.9–10.3)
Chloride: 102 mmol/L (ref 98–111)
Creatinine, Ser: 6.88 mg/dL — ABNORMAL HIGH (ref 0.61–1.24)
GFR, Estimated: 8 mL/min — ABNORMAL LOW (ref >60)
Glucose, Bld: 165 mg/dL — ABNORMAL HIGH (ref 70–99)
Phosphorus: 6.1 mg/dL — ABNORMAL HIGH (ref 2.5–4.6)
Potassium: 3.8 mmol/L (ref 3.5–5.1)
Sodium: 143 mmol/L (ref 135–145)

## 2023-07-16 LAB — IRON AND TIBC
Iron: 81 ug/dL (ref 45–182)
Saturation Ratios: 37 % (ref 17.9–39.5)
TIBC: 220 ug/dL — ABNORMAL LOW (ref 250–450)
UIBC: 139 ug/dL

## 2023-07-16 LAB — HEPATITIS B SURFACE ANTIGEN: Hepatitis B Surface Ag: NONREACTIVE

## 2023-07-16 LAB — POCT HEMOGLOBIN-HEMACUE: Hemoglobin: 10.2 g/dL — ABNORMAL LOW (ref 13.0–17.0)

## 2023-07-16 LAB — FERRITIN: Ferritin: 248 ng/mL (ref 24–336)

## 2023-07-16 MED ORDER — EPOETIN ALFA-EPBX 10000 UNIT/ML IJ SOLN
INTRAMUSCULAR | Status: AC
Start: 2023-07-16 — End: 2023-07-16
  Administered 2023-07-16: 10000 [IU] via SUBCUTANEOUS
  Filled 2023-07-16: qty 1

## 2023-07-16 MED ORDER — EPOETIN ALFA-EPBX 10000 UNIT/ML IJ SOLN: 10000.0000 [IU] | INTRAMUSCULAR | Status: DC

## 2023-07-17 LAB — PTH, INTACT AND CALCIUM
Calcium, Total (PTH): 9.3 mg/dL (ref 8.6–10.2)
PTH: 448 pg/mL — ABNORMAL HIGH (ref 15–65)

## 2023-07-30 ENCOUNTER — Encounter (HOSPITAL_COMMUNITY)
Admission: RE | Admit: 2023-07-30 | Discharge: 2023-07-30 | Disposition: A | Discharge status: Home / Self Care | Payer: Medicare Other | Source: Ambulatory Visit | Attending: Nephrology | Admitting: Nephrology

## 2023-07-30 VITALS — BP 127/59 | HR 67 | Temp 97.6°F | Resp 17

## 2023-07-30 DIAGNOSIS — D649 Anemia, unspecified: Secondary | ICD-10-CM | POA: Diagnosis not present

## 2023-07-30 DIAGNOSIS — N2581 Secondary hyperparathyroidism of renal origin: Secondary | ICD-10-CM | POA: Diagnosis not present

## 2023-07-30 DIAGNOSIS — I129 Hypertensive chronic kidney disease with stage 1 through stage 4 chronic kidney disease, or unspecified chronic kidney disease: Secondary | ICD-10-CM | POA: Diagnosis not present

## 2023-07-30 DIAGNOSIS — N184 Chronic kidney disease, stage 4 (severe): Secondary | ICD-10-CM | POA: Diagnosis not present

## 2023-07-30 DIAGNOSIS — N185 Chronic kidney disease, stage 5: Secondary | ICD-10-CM | POA: Diagnosis not present

## 2023-07-30 DIAGNOSIS — E1122 Type 2 diabetes mellitus with diabetic chronic kidney disease: Secondary | ICD-10-CM | POA: Diagnosis not present

## 2023-07-30 DIAGNOSIS — I120 Hypertensive chronic kidney disease with stage 5 chronic kidney disease or end stage renal disease: Secondary | ICD-10-CM | POA: Diagnosis not present

## 2023-07-30 DIAGNOSIS — M109 Gout, unspecified: Secondary | ICD-10-CM | POA: Diagnosis not present

## 2023-07-30 LAB — POCT HEMOGLOBIN-HEMACUE: Hemoglobin: 10 g/dL — ABNORMAL LOW (ref 13.0–17.0)

## 2023-07-30 MED ORDER — EPOETIN ALFA-EPBX 10000 UNIT/ML IJ SOLN
INTRAMUSCULAR | Status: AC
  Filled 2023-07-30: qty 1

## 2023-07-30 MED ORDER — EPOETIN ALFA-EPBX 10000 UNIT/ML IJ SOLN
10000.0000 [IU] | INTRAMUSCULAR | Status: DC
  Administered 2023-07-30: 10000 [IU] via SUBCUTANEOUS

## 2023-08-03 DIAGNOSIS — E7800 Pure hypercholesterolemia, unspecified: Secondary | ICD-10-CM | POA: Diagnosis not present

## 2023-08-03 DIAGNOSIS — N185 Chronic kidney disease, stage 5: Secondary | ICD-10-CM | POA: Diagnosis not present

## 2023-08-03 DIAGNOSIS — Z79899 Other long term (current) drug therapy: Secondary | ICD-10-CM | POA: Diagnosis not present

## 2023-08-03 DIAGNOSIS — I10 Essential (primary) hypertension: Secondary | ICD-10-CM | POA: Diagnosis not present

## 2023-08-03 DIAGNOSIS — M1A40X0 Other secondary chronic gout, unspecified site, without tophus (tophi): Secondary | ICD-10-CM | POA: Diagnosis not present

## 2023-08-03 DIAGNOSIS — E1121 Type 2 diabetes mellitus with diabetic nephropathy: Secondary | ICD-10-CM | POA: Diagnosis not present

## 2023-08-03 DIAGNOSIS — N2581 Secondary hyperparathyroidism of renal origin: Secondary | ICD-10-CM | POA: Diagnosis not present

## 2023-08-13 ENCOUNTER — Encounter (HOSPITAL_COMMUNITY)
Admission: RE | Admit: 2023-08-13 | Discharge: 2023-08-13 | Disposition: A | Discharge status: Home / Self Care | Source: Ambulatory Visit | Attending: Nephrology | Admitting: Nephrology

## 2023-08-13 VITALS — BP 140/56 | HR 71 | Temp 97.5°F | Resp 17

## 2023-08-13 DIAGNOSIS — N184 Chronic kidney disease, stage 4 (severe): Secondary | ICD-10-CM

## 2023-08-13 LAB — RENAL FUNCTION PANEL
Albumin: 3.4 g/dL — ABNORMAL LOW (ref 3.5–5.0)
Anion gap: 11 (ref 5–15)
BUN: 79 mg/dL — ABNORMAL HIGH (ref 8–23)
CO2: 27 mmol/L (ref 22–32)
Calcium: 9.4 mg/dL (ref 8.9–10.3)
Chloride: 104 mmol/L (ref 98–111)
Creatinine, Ser: 7.28 mg/dL — ABNORMAL HIGH (ref 0.61–1.24)
GFR, Estimated: 7 mL/min — ABNORMAL LOW (ref >60)
Glucose, Bld: 155 mg/dL — ABNORMAL HIGH (ref 70–99)
Phosphorus: 5.7 mg/dL — ABNORMAL HIGH (ref 2.5–4.6)
Potassium: 4 mmol/L (ref 3.5–5.1)
Sodium: 142 mmol/L (ref 135–145)

## 2023-08-13 LAB — FERRITIN: Ferritin: 268 ng/mL (ref 24–336)

## 2023-08-13 LAB — HEPATITIS B SURFACE ANTIGEN: Hepatitis B Surface Ag: NONREACTIVE

## 2023-08-13 LAB — IRON AND TIBC
Iron: 72 ug/dL (ref 45–182)
Saturation Ratios: 33 % (ref 17.9–39.5)
TIBC: 216 ug/dL — ABNORMAL LOW (ref 250–450)
UIBC: 144 ug/dL

## 2023-08-13 LAB — POCT HEMOGLOBIN-HEMACUE: Hemoglobin: 10.1 g/dL — ABNORMAL LOW (ref 13.0–17.0)

## 2023-08-13 MED ORDER — EPOETIN ALFA-EPBX 10000 UNIT/ML IJ SOLN
INTRAMUSCULAR | Status: AC
  Filled 2023-08-13: qty 1

## 2023-08-13 MED ORDER — EPOETIN ALFA-EPBX 10000 UNIT/ML IJ SOLN
10000.0000 [IU] | INTRAMUSCULAR | Status: DC
  Administered 2023-08-13: 10000 [IU] via SUBCUTANEOUS

## 2023-08-14 LAB — PTH, INTACT AND CALCIUM
Calcium, Total (PTH): 9.4 mg/dL (ref 8.6–10.2)
PTH: 315 pg/mL — ABNORMAL HIGH (ref 15–65)

## 2023-08-27 ENCOUNTER — Encounter (HOSPITAL_COMMUNITY)
Admission: RE | Admit: 2023-08-27 | Discharge: 2023-08-27 | Disposition: A | Discharge status: Home / Self Care | Source: Ambulatory Visit | Attending: Nephrology | Admitting: Nephrology

## 2023-08-27 VITALS — BP 125/52 | HR 69 | Temp 97.9°F | Resp 17

## 2023-08-27 DIAGNOSIS — N184 Chronic kidney disease, stage 4 (severe): Secondary | ICD-10-CM | POA: Diagnosis not present

## 2023-08-27 LAB — POCT HEMOGLOBIN-HEMACUE: Hemoglobin: 9.5 g/dL — ABNORMAL LOW (ref 13.0–17.0)

## 2023-08-27 MED ORDER — EPOETIN ALFA-EPBX 10000 UNIT/ML IJ SOLN
INTRAMUSCULAR | Status: AC
  Administered 2023-08-27: 10000 [IU] via SUBCUTANEOUS
  Filled 2023-08-27: qty 1

## 2023-08-27 MED ORDER — EPOETIN ALFA-EPBX 10000 UNIT/ML IJ SOLN: 10000.0000 [IU] | INTRAMUSCULAR | Status: DC

## 2023-09-01 DIAGNOSIS — E1122 Type 2 diabetes mellitus with diabetic chronic kidney disease: Secondary | ICD-10-CM | POA: Diagnosis not present

## 2023-09-01 DIAGNOSIS — D649 Anemia, unspecified: Secondary | ICD-10-CM | POA: Diagnosis not present

## 2023-09-01 DIAGNOSIS — M109 Gout, unspecified: Secondary | ICD-10-CM | POA: Diagnosis not present

## 2023-09-01 DIAGNOSIS — N2581 Secondary hyperparathyroidism of renal origin: Secondary | ICD-10-CM | POA: Diagnosis not present

## 2023-09-01 DIAGNOSIS — I129 Hypertensive chronic kidney disease with stage 1 through stage 4 chronic kidney disease, or unspecified chronic kidney disease: Secondary | ICD-10-CM | POA: Diagnosis not present

## 2023-09-01 DIAGNOSIS — I120 Hypertensive chronic kidney disease with stage 5 chronic kidney disease or end stage renal disease: Secondary | ICD-10-CM | POA: Diagnosis not present

## 2023-09-01 DIAGNOSIS — N185 Chronic kidney disease, stage 5: Secondary | ICD-10-CM | POA: Diagnosis not present

## 2023-09-10 ENCOUNTER — Encounter (HOSPITAL_COMMUNITY)
Admission: RE | Admit: 2023-09-10 | Discharge: 2023-09-10 | Disposition: A | Discharge status: Home / Self Care | Source: Ambulatory Visit | Attending: Nephrology | Admitting: Nephrology

## 2023-09-10 VITALS — BP 128/65 | HR 65 | Temp 97.0°F | Resp 17

## 2023-09-10 DIAGNOSIS — N184 Chronic kidney disease, stage 4 (severe): Secondary | ICD-10-CM | POA: Diagnosis not present

## 2023-09-10 LAB — RENAL FUNCTION PANEL
Albumin: 3.6 g/dL (ref 3.5–5.0)
Anion gap: 14 (ref 5–15)
BUN: 87 mg/dL — ABNORMAL HIGH (ref 8–23)
CO2: 24 mmol/L (ref 22–32)
Calcium: 9.6 mg/dL (ref 8.9–10.3)
Chloride: 104 mmol/L (ref 98–111)
Creatinine, Ser: 7.11 mg/dL — ABNORMAL HIGH (ref 0.61–1.24)
GFR, Estimated: 7 mL/min — ABNORMAL LOW (ref >60)
Glucose, Bld: 106 mg/dL — ABNORMAL HIGH (ref 70–99)
Phosphorus: 5.8 mg/dL — ABNORMAL HIGH (ref 2.5–4.6)
Potassium: 4.2 mmol/L (ref 3.5–5.1)
Sodium: 142 mmol/L (ref 135–145)

## 2023-09-10 LAB — IRON AND TIBC
Iron: 59 ug/dL (ref 45–182)
Saturation Ratios: 26 % (ref 17.9–39.5)
TIBC: 228 ug/dL — ABNORMAL LOW (ref 250–450)
UIBC: 169 ug/dL

## 2023-09-10 LAB — HEPATITIS B SURFACE ANTIGEN: Hepatitis B Surface Ag: NONREACTIVE

## 2023-09-10 LAB — FERRITIN: Ferritin: 265 ng/mL (ref 24–336)

## 2023-09-10 LAB — POCT HEMOGLOBIN-HEMACUE: Hemoglobin: 10.4 g/dL — ABNORMAL LOW (ref 13.0–17.0)

## 2023-09-10 MED ORDER — EPOETIN ALFA 10000 UNIT/ML IJ SOLN
INTRAMUSCULAR | Status: AC
Start: 2023-09-10 — End: 2023-09-10
  Filled 2023-09-10: qty 1

## 2023-09-10 MED ORDER — EPOETIN ALFA 10000 UNIT/ML IJ SOLN
10000.0000 [IU] | INTRAMUSCULAR | Status: DC
  Administered 2023-09-10: 10000 [IU] via SUBCUTANEOUS

## 2023-09-12 LAB — PTH, INTACT AND CALCIUM
Calcium, Total (PTH): 9.5 mg/dL (ref 8.6–10.2)
PTH: 323 pg/mL — ABNORMAL HIGH (ref 15–65)

## 2023-09-24 ENCOUNTER — Ambulatory Visit (HOSPITAL_COMMUNITY)
Admission: RE | Admit: 2023-09-24 | Discharge: 2023-09-24 | Disposition: A | Discharge status: Home / Self Care | Source: Ambulatory Visit | Attending: Nephrology | Admitting: Nephrology

## 2023-09-24 VITALS — BP 115/61 | HR 70 | Temp 97.5°F | Resp 17

## 2023-09-24 DIAGNOSIS — N184 Chronic kidney disease, stage 4 (severe): Secondary | ICD-10-CM | POA: Insufficient documentation

## 2023-09-24 LAB — POCT HEMOGLOBIN-HEMACUE: Hemoglobin: 10.2 g/dL — ABNORMAL LOW (ref 13.0–17.0)

## 2023-09-24 MED ORDER — EPOETIN ALFA 10000 UNIT/ML IJ SOLN
10000.0000 [IU] | INTRAMUSCULAR | Status: DC
  Administered 2023-09-24: 10000 [IU] via SUBCUTANEOUS

## 2023-09-24 MED ORDER — EPOETIN ALFA 10000 UNIT/ML IJ SOLN
INTRAMUSCULAR | Status: AC
Start: 2023-09-24 — End: 2023-09-24
  Filled 2023-09-24: qty 1

## 2023-10-08 ENCOUNTER — Encounter (HOSPITAL_COMMUNITY)
Admission: RE | Admit: 2023-10-08 | Discharge: 2023-10-08 | Disposition: A | Discharge status: Home / Self Care | Source: Ambulatory Visit | Attending: Nephrology | Admitting: Nephrology

## 2023-10-08 VITALS — BP 125/58 | HR 70 | Temp 97.5°F | Resp 16

## 2023-10-08 DIAGNOSIS — N184 Chronic kidney disease, stage 4 (severe): Secondary | ICD-10-CM | POA: Diagnosis not present

## 2023-10-08 LAB — RENAL FUNCTION PANEL
Albumin: 3.5 g/dL (ref 3.5–5.0)
Anion gap: 12 (ref 5–15)
BUN: 83 mg/dL — ABNORMAL HIGH (ref 8–23)
CO2: 25 mmol/L (ref 22–32)
Calcium: 9.4 mg/dL (ref 8.9–10.3)
Chloride: 106 mmol/L (ref 98–111)
Creatinine, Ser: 6.97 mg/dL — ABNORMAL HIGH (ref 0.61–1.24)
GFR, Estimated: 8 mL/min — ABNORMAL LOW (ref >60)
Glucose, Bld: 81 mg/dL (ref 70–99)
Phosphorus: 5.2 mg/dL — ABNORMAL HIGH (ref 2.5–4.6)
Potassium: 4 mmol/L (ref 3.5–5.1)
Sodium: 143 mmol/L (ref 135–145)

## 2023-10-08 LAB — POCT HEMOGLOBIN-HEMACUE: Hemoglobin: 10 g/dL — ABNORMAL LOW (ref 13.0–17.0)

## 2023-10-08 LAB — IRON AND TIBC
Iron: 77 ug/dL (ref 45–182)
Saturation Ratios: 35 % (ref 17.9–39.5)
TIBC: 220 ug/dL — ABNORMAL LOW (ref 250–450)
UIBC: 143 ug/dL

## 2023-10-08 LAB — FERRITIN: Ferritin: 232 ng/mL (ref 24–336)

## 2023-10-08 LAB — HEPATITIS B SURFACE ANTIGEN: Hepatitis B Surface Ag: NONREACTIVE

## 2023-10-08 MED ORDER — EPOETIN ALFA-EPBX 10000 UNIT/ML IJ SOLN
10000.0000 [IU] | Freq: Once | INTRAMUSCULAR | Status: AC
  Administered 2023-10-08: 10000 [IU] via SUBCUTANEOUS

## 2023-10-08 MED ORDER — EPOETIN ALFA-EPBX 10000 UNIT/ML IJ SOLN
INTRAMUSCULAR | Status: AC
  Filled 2023-10-08: qty 1

## 2023-10-09 LAB — PTH, INTACT AND CALCIUM
Calcium, Total (PTH): 9.4 mg/dL (ref 8.6–10.2)
PTH: 313 pg/mL — ABNORMAL HIGH (ref 15–65)

## 2023-10-15 DIAGNOSIS — D649 Anemia, unspecified: Secondary | ICD-10-CM | POA: Diagnosis not present

## 2023-10-15 DIAGNOSIS — E1122 Type 2 diabetes mellitus with diabetic chronic kidney disease: Secondary | ICD-10-CM | POA: Diagnosis not present

## 2023-10-15 DIAGNOSIS — N185 Chronic kidney disease, stage 5: Secondary | ICD-10-CM | POA: Diagnosis not present

## 2023-10-15 DIAGNOSIS — I120 Hypertensive chronic kidney disease with stage 5 chronic kidney disease or end stage renal disease: Secondary | ICD-10-CM | POA: Diagnosis not present

## 2023-10-15 DIAGNOSIS — N2581 Secondary hyperparathyroidism of renal origin: Secondary | ICD-10-CM | POA: Diagnosis not present

## 2023-11-05 ENCOUNTER — Encounter (HOSPITAL_COMMUNITY)
Admission: RE | Admit: 2023-11-05 | Discharge: 2023-11-05 | Disposition: A | Discharge status: Home / Self Care | Source: Ambulatory Visit | Attending: Nephrology | Admitting: Nephrology

## 2023-11-05 VITALS — BP 135/61 | HR 65 | Temp 97.3°F | Resp 16

## 2023-11-05 DIAGNOSIS — N184 Chronic kidney disease, stage 4 (severe): Secondary | ICD-10-CM | POA: Diagnosis not present

## 2023-11-05 LAB — RENAL FUNCTION PANEL
Albumin: 3.6 g/dL (ref 3.5–5.0)
Anion gap: 16 — ABNORMAL HIGH (ref 5–15)
BUN: 87 mg/dL — ABNORMAL HIGH (ref 8–23)
CO2: 24 mmol/L (ref 22–32)
Calcium: 9.4 mg/dL (ref 8.9–10.3)
Chloride: 104 mmol/L (ref 98–111)
Creatinine, Ser: 7.16 mg/dL — ABNORMAL HIGH (ref 0.61–1.24)
GFR, Estimated: 7 mL/min — ABNORMAL LOW (ref >60)
Glucose, Bld: 109 mg/dL — ABNORMAL HIGH (ref 70–99)
Phosphorus: 6.6 mg/dL — ABNORMAL HIGH (ref 2.5–4.6)
Potassium: 4 mmol/L (ref 3.5–5.1)
Sodium: 144 mmol/L (ref 135–145)

## 2023-11-05 LAB — IRON AND TIBC
Iron: 69 ug/dL (ref 45–182)
Saturation Ratios: 31 % (ref 17.9–39.5)
TIBC: 223 ug/dL — ABNORMAL LOW (ref 250–450)
UIBC: 154 ug/dL

## 2023-11-05 LAB — HEPATITIS B SURFACE ANTIGEN: Hepatitis B Surface Ag: NONREACTIVE

## 2023-11-05 LAB — FERRITIN: Ferritin: 257 ng/mL (ref 24–336)

## 2023-11-05 LAB — POCT HEMOGLOBIN-HEMACUE: Hemoglobin: 9.7 g/dL — ABNORMAL LOW (ref 13.0–17.0)

## 2023-11-05 MED ORDER — EPOETIN ALFA-EPBX 10000 UNIT/ML IJ SOLN
INTRAMUSCULAR | Status: AC
Start: 2023-11-05 — End: 2023-11-05
  Filled 2023-11-05: qty 1

## 2023-11-05 MED ORDER — EPOETIN ALFA-EPBX 10000 UNIT/ML IJ SOLN
10000.0000 [IU] | Freq: Once | INTRAMUSCULAR | Status: AC
  Administered 2023-11-05: 10000 [IU] via SUBCUTANEOUS

## 2023-11-07 LAB — PTH, INTACT AND CALCIUM
Calcium, Total (PTH): 9.4 mg/dL (ref 8.6–10.2)
PTH: 276 pg/mL — ABNORMAL HIGH (ref 15–65)

## 2023-11-19 ENCOUNTER — Encounter (HOSPITAL_COMMUNITY)
Admission: RE | Admit: 2023-11-19 | Discharge: 2023-11-19 | Disposition: A | Discharge status: Home / Self Care | Source: Ambulatory Visit | Attending: Nephrology | Admitting: Nephrology

## 2023-11-19 VITALS — BP 120/58 | HR 64 | Temp 97.0°F | Resp 16

## 2023-11-19 DIAGNOSIS — N2581 Secondary hyperparathyroidism of renal origin: Secondary | ICD-10-CM | POA: Diagnosis not present

## 2023-11-19 DIAGNOSIS — E1122 Type 2 diabetes mellitus with diabetic chronic kidney disease: Secondary | ICD-10-CM | POA: Diagnosis not present

## 2023-11-19 DIAGNOSIS — I120 Hypertensive chronic kidney disease with stage 5 chronic kidney disease or end stage renal disease: Secondary | ICD-10-CM | POA: Diagnosis not present

## 2023-11-19 DIAGNOSIS — D649 Anemia, unspecified: Secondary | ICD-10-CM | POA: Diagnosis not present

## 2023-11-19 DIAGNOSIS — N184 Chronic kidney disease, stage 4 (severe): Secondary | ICD-10-CM | POA: Diagnosis not present

## 2023-11-19 DIAGNOSIS — M109 Gout, unspecified: Secondary | ICD-10-CM | POA: Diagnosis not present

## 2023-11-19 DIAGNOSIS — N185 Chronic kidney disease, stage 5: Secondary | ICD-10-CM | POA: Diagnosis not present

## 2023-11-19 LAB — POCT HEMOGLOBIN-HEMACUE: Hemoglobin: 9.7 g/dL — ABNORMAL LOW (ref 13.0–17.0)

## 2023-11-19 MED ORDER — EPOETIN ALFA-EPBX 10000 UNIT/ML IJ SOLN
INTRAMUSCULAR | Status: AC
  Filled 2023-11-19: qty 1

## 2023-11-19 MED ORDER — EPOETIN ALFA-EPBX 10000 UNIT/ML IJ SOLN
10000.0000 [IU] | Freq: Once | INTRAMUSCULAR | Status: AC
  Administered 2023-11-19: 10000 [IU] via SUBCUTANEOUS

## 2023-12-03 ENCOUNTER — Encounter (HOSPITAL_COMMUNITY)
Admission: RE | Admit: 2023-12-03 | Discharge: 2023-12-03 | Disposition: A | Discharge status: Home / Self Care | Source: Ambulatory Visit | Attending: Nephrology | Admitting: Nephrology

## 2023-12-03 VITALS — BP 115/57 | HR 65 | Temp 97.2°F | Resp 16

## 2023-12-03 DIAGNOSIS — N184 Chronic kidney disease, stage 4 (severe): Secondary | ICD-10-CM | POA: Diagnosis not present

## 2023-12-03 LAB — IRON AND TIBC
Iron: 67 ug/dL (ref 45–182)
Saturation Ratios: 33 % (ref 17.9–39.5)
TIBC: 204 ug/dL — ABNORMAL LOW (ref 250–450)
UIBC: 137 ug/dL

## 2023-12-03 LAB — HEPATITIS B SURFACE ANTIGEN: Hepatitis B Surface Ag: NONREACTIVE

## 2023-12-03 LAB — RENAL FUNCTION PANEL
Albumin: 3.5 g/dL (ref 3.5–5.0)
Anion gap: 14 (ref 5–15)
BUN: 81 mg/dL — ABNORMAL HIGH (ref 8–23)
CO2: 25 mmol/L (ref 22–32)
Calcium: 9.5 mg/dL (ref 8.9–10.3)
Chloride: 102 mmol/L (ref 98–111)
Creatinine, Ser: 7.27 mg/dL — ABNORMAL HIGH (ref 0.61–1.24)
GFR, Estimated: 7 mL/min — ABNORMAL LOW (ref >60)
Glucose, Bld: 117 mg/dL — ABNORMAL HIGH (ref 70–99)
Phosphorus: 5.2 mg/dL — ABNORMAL HIGH (ref 2.5–4.6)
Potassium: 3.6 mmol/L (ref 3.5–5.1)
Sodium: 141 mmol/L (ref 135–145)

## 2023-12-03 LAB — FERRITIN: Ferritin: 247 ng/mL (ref 24–336)

## 2023-12-03 LAB — POCT HEMOGLOBIN-HEMACUE: Hemoglobin: 9.7 g/dL — ABNORMAL LOW (ref 13.0–17.0)

## 2023-12-03 MED ORDER — EPOETIN ALFA-EPBX 10000 UNIT/ML IJ SOLN
INTRAMUSCULAR | Status: AC
  Filled 2023-12-03: qty 1

## 2023-12-03 MED ORDER — EPOETIN ALFA-EPBX 10000 UNIT/ML IJ SOLN
10000.0000 [IU] | Freq: Once | INTRAMUSCULAR | Status: AC
  Administered 2023-12-03: 10000 [IU] via SUBCUTANEOUS

## 2023-12-05 LAB — PTH, INTACT AND CALCIUM
Calcium, Total (PTH): 9.3 mg/dL (ref 8.6–10.2)
PTH: 346 pg/mL — ABNORMAL HIGH (ref 15–65)

## 2023-12-17 ENCOUNTER — Encounter (HOSPITAL_COMMUNITY)
Admission: RE | Admit: 2023-12-17 | Discharge: 2023-12-17 | Disposition: A | Discharge status: Home / Self Care | Source: Ambulatory Visit | Attending: Nephrology | Admitting: Nephrology

## 2023-12-17 VITALS — BP 115/56 | HR 63 | Temp 97.3°F | Resp 16

## 2023-12-17 DIAGNOSIS — N184 Chronic kidney disease, stage 4 (severe): Secondary | ICD-10-CM

## 2023-12-17 LAB — POCT HEMOGLOBIN-HEMACUE: Hemoglobin: 10.1 g/dL — ABNORMAL LOW (ref 13.0–17.0)

## 2023-12-17 MED ORDER — EPOETIN ALFA-EPBX 10000 UNIT/ML IJ SOLN
10000.0000 [IU] | Freq: Once | INTRAMUSCULAR | Status: AC
  Administered 2023-12-17: 10000 [IU] via SUBCUTANEOUS

## 2023-12-17 MED ORDER — EPOETIN ALFA-EPBX 10000 UNIT/ML IJ SOLN
INTRAMUSCULAR | Status: AC
Start: 2023-12-17 — End: 2023-12-17
  Filled 2023-12-17: qty 1

## 2023-12-21 DIAGNOSIS — D649 Anemia, unspecified: Secondary | ICD-10-CM | POA: Diagnosis not present

## 2023-12-21 DIAGNOSIS — E1122 Type 2 diabetes mellitus with diabetic chronic kidney disease: Secondary | ICD-10-CM | POA: Diagnosis not present

## 2023-12-21 DIAGNOSIS — N2581 Secondary hyperparathyroidism of renal origin: Secondary | ICD-10-CM | POA: Diagnosis not present

## 2023-12-21 DIAGNOSIS — N185 Chronic kidney disease, stage 5: Secondary | ICD-10-CM | POA: Diagnosis not present

## 2023-12-21 DIAGNOSIS — I120 Hypertensive chronic kidney disease with stage 5 chronic kidney disease or end stage renal disease: Secondary | ICD-10-CM | POA: Diagnosis not present

## 2023-12-21 DIAGNOSIS — E785 Hyperlipidemia, unspecified: Secondary | ICD-10-CM | POA: Diagnosis not present

## 2023-12-31 ENCOUNTER — Encounter (HOSPITAL_COMMUNITY)
Admission: RE | Admit: 2023-12-31 | Discharge: 2023-12-31 | Disposition: A | Discharge status: Home / Self Care | Source: Ambulatory Visit | Attending: Nephrology | Admitting: Nephrology

## 2023-12-31 VITALS — BP 110/49 | HR 64 | Temp 97.9°F | Resp 16

## 2023-12-31 DIAGNOSIS — D631 Anemia in chronic kidney disease: Secondary | ICD-10-CM | POA: Diagnosis not present

## 2023-12-31 DIAGNOSIS — N184 Chronic kidney disease, stage 4 (severe): Secondary | ICD-10-CM | POA: Diagnosis not present

## 2023-12-31 LAB — RENAL FUNCTION PANEL
Albumin: 3.3 g/dL — ABNORMAL LOW (ref 3.5–5.0)
Anion gap: 12 (ref 5–15)
BUN: 70 mg/dL — ABNORMAL HIGH (ref 8–23)
CO2: 24 mmol/L (ref 22–32)
Calcium: 9.3 mg/dL (ref 8.9–10.3)
Chloride: 108 mmol/L (ref 98–111)
Creatinine, Ser: 6.97 mg/dL — ABNORMAL HIGH (ref 0.61–1.24)
GFR, Estimated: 8 mL/min — ABNORMAL LOW (ref >60)
Glucose, Bld: 91 mg/dL (ref 70–99)
Phosphorus: 5.3 mg/dL — ABNORMAL HIGH (ref 2.5–4.6)
Potassium: 4.1 mmol/L (ref 3.5–5.1)
Sodium: 144 mmol/L (ref 135–145)

## 2023-12-31 LAB — IRON AND TIBC
Iron: 70 ug/dL (ref 45–182)
Saturation Ratios: 34 % (ref 17.9–39.5)
TIBC: 204 ug/dL — ABNORMAL LOW (ref 250–450)
UIBC: 134 ug/dL

## 2023-12-31 LAB — FERRITIN: Ferritin: 128 ng/mL (ref 24–336)

## 2023-12-31 LAB — POCT HEMOGLOBIN-HEMACUE: Hemoglobin: 9.6 g/dL — ABNORMAL LOW (ref 13.0–17.0)

## 2023-12-31 LAB — HEPATITIS B SURFACE ANTIGEN: Hepatitis B Surface Ag: NONREACTIVE

## 2023-12-31 MED ORDER — EPOETIN ALFA-EPBX 10000 UNIT/ML IJ SOLN
INTRAMUSCULAR | Status: AC
  Filled 2023-12-31: qty 1

## 2023-12-31 MED ORDER — EPOETIN ALFA-EPBX 10000 UNIT/ML IJ SOLN
10000.0000 [IU] | Freq: Once | INTRAMUSCULAR | Status: AC
  Administered 2023-12-31: 10000 [IU] via SUBCUTANEOUS

## 2024-01-02 LAB — PTH, INTACT AND CALCIUM
Calcium, Total (PTH): 9.2 mg/dL (ref 8.6–10.2)
PTH: 404 pg/mL — ABNORMAL HIGH (ref 15–65)

## 2024-01-14 ENCOUNTER — Encounter (HOSPITAL_COMMUNITY)
Admission: RE | Admit: 2024-01-14 | Discharge: 2024-01-14 | Disposition: A | Discharge status: Home / Self Care | Source: Ambulatory Visit | Attending: Nephrology | Admitting: Nephrology

## 2024-01-14 VITALS — BP 141/75 | HR 64 | Temp 97.5°F | Resp 16

## 2024-01-14 DIAGNOSIS — N184 Chronic kidney disease, stage 4 (severe): Secondary | ICD-10-CM | POA: Diagnosis not present

## 2024-01-14 DIAGNOSIS — D631 Anemia in chronic kidney disease: Secondary | ICD-10-CM | POA: Diagnosis not present

## 2024-01-14 LAB — POCT HEMOGLOBIN-HEMACUE: Hemoglobin: 9.5 g/dL — ABNORMAL LOW (ref 13.0–17.0)

## 2024-01-14 MED ORDER — EPOETIN ALFA-EPBX 10000 UNIT/ML IJ SOLN
INTRAMUSCULAR | Status: AC
  Filled 2024-01-14: qty 1

## 2024-01-14 MED ORDER — EPOETIN ALFA-EPBX 10000 UNIT/ML IJ SOLN
10000.0000 [IU] | Freq: Once | INTRAMUSCULAR | Status: AC
  Administered 2024-01-14: 10000 [IU] via SUBCUTANEOUS

## 2024-01-28 ENCOUNTER — Telehealth: Payer: Self-pay | Admitting: Pharmacy Technician

## 2024-01-28 ENCOUNTER — Ambulatory Visit (HOSPITAL_COMMUNITY)
Admission: RE | Admit: 2024-01-28 | Discharge: 2024-01-28 | Disposition: A | Discharge status: Home / Self Care | Source: Ambulatory Visit | Attending: Nephrology | Admitting: Nephrology

## 2024-01-28 VITALS — BP 153/65 | HR 69 | Temp 97.7°F | Resp 16

## 2024-01-28 DIAGNOSIS — D631 Anemia in chronic kidney disease: Secondary | ICD-10-CM | POA: Diagnosis not present

## 2024-01-28 DIAGNOSIS — N184 Chronic kidney disease, stage 4 (severe): Secondary | ICD-10-CM | POA: Diagnosis not present

## 2024-01-28 LAB — HEPATITIS B SURFACE ANTIGEN: Hepatitis B Surface Ag: NONREACTIVE

## 2024-01-28 LAB — RENAL FUNCTION PANEL
Albumin: 3.6 g/dL (ref 3.5–5.0)
Anion gap: 10 (ref 5–15)
BUN: 53 mg/dL — ABNORMAL HIGH (ref 8–23)
CO2: 21 mmol/L — ABNORMAL LOW (ref 22–32)
Calcium: 9.5 mg/dL (ref 8.9–10.3)
Chloride: 112 mmol/L — ABNORMAL HIGH (ref 98–111)
Creatinine, Ser: 6.37 mg/dL — ABNORMAL HIGH (ref 0.61–1.24)
GFR, Estimated: 8 mL/min — ABNORMAL LOW (ref >60)
Glucose, Bld: 96 mg/dL (ref 70–99)
Phosphorus: 4.8 mg/dL — ABNORMAL HIGH (ref 2.5–4.6)
Potassium: 5.3 mmol/L — ABNORMAL HIGH (ref 3.5–5.1)
Sodium: 143 mmol/L (ref 135–145)

## 2024-01-28 LAB — IRON AND TIBC
Iron: 72 ug/dL (ref 45–182)
Saturation Ratios: 34 % (ref 17.9–39.5)
TIBC: 211 ug/dL — ABNORMAL LOW (ref 250–450)
UIBC: 139 ug/dL

## 2024-01-28 LAB — POCT HEMOGLOBIN-HEMACUE: Hemoglobin: 10.3 g/dL — ABNORMAL LOW (ref 13.0–17.0)

## 2024-01-28 LAB — FERRITIN: Ferritin: 262 ng/mL (ref 24–336)

## 2024-01-28 MED ORDER — EPOETIN ALFA-EPBX 10000 UNIT/ML IJ SOLN
INTRAMUSCULAR | Status: AC
  Filled 2024-01-28: qty 1

## 2024-01-28 MED ORDER — EPOETIN ALFA-EPBX 10000 UNIT/ML IJ SOLN
10000.0000 [IU] | Freq: Once | INTRAMUSCULAR | Status: AC
  Administered 2024-01-28: 10000 [IU] via SUBCUTANEOUS

## 2024-01-28 NOTE — Telephone Encounter (Signed)
 Auth Submission: NO AUTH NEEDED Site of care: Site of care: [facility] Payer: [employer] MEDICARE Medication & CPT/J Code(s) submitted: RETACRIT  Diagnosis Code:  Route of submission (phone, fax, portal):  Phone # Fax # Auth type: Buy/Bill HB Units/visits requested: 10,000 UNITS Q14D Reference number:  Approval from: 11/04/23 to 05/24/24

## 2024-01-30 LAB — PTH, INTACT AND CALCIUM
Calcium, Total (PTH): 9.3 mg/dL (ref 8.6–10.2)
PTH: 151 pg/mL — ABNORMAL HIGH (ref 15–65)

## 2024-02-02 DIAGNOSIS — E113299 Type 2 diabetes mellitus with mild nonproliferative diabetic retinopathy without macular edema, unspecified eye: Secondary | ICD-10-CM | POA: Diagnosis not present

## 2024-02-02 DIAGNOSIS — E1121 Type 2 diabetes mellitus with diabetic nephropathy: Secondary | ICD-10-CM | POA: Diagnosis not present

## 2024-02-02 DIAGNOSIS — I10 Essential (primary) hypertension: Secondary | ICD-10-CM | POA: Diagnosis not present

## 2024-02-02 DIAGNOSIS — N185 Chronic kidney disease, stage 5: Secondary | ICD-10-CM | POA: Insufficient documentation

## 2024-02-02 DIAGNOSIS — N2581 Secondary hyperparathyroidism of renal origin: Secondary | ICD-10-CM | POA: Diagnosis not present

## 2024-02-02 DIAGNOSIS — M79604 Pain in right leg: Secondary | ICD-10-CM | POA: Diagnosis not present

## 2024-02-02 DIAGNOSIS — E7800 Pure hypercholesterolemia, unspecified: Secondary | ICD-10-CM | POA: Diagnosis not present

## 2024-02-02 DIAGNOSIS — M79605 Pain in left leg: Secondary | ICD-10-CM | POA: Diagnosis not present

## 2024-02-02 DIAGNOSIS — Z23 Encounter for immunization: Secondary | ICD-10-CM | POA: Diagnosis not present

## 2024-02-02 DIAGNOSIS — Z0000 Encounter for general adult medical examination without abnormal findings: Secondary | ICD-10-CM | POA: Diagnosis not present

## 2024-02-02 DIAGNOSIS — Z79899 Other long term (current) drug therapy: Secondary | ICD-10-CM | POA: Diagnosis not present

## 2024-02-11 ENCOUNTER — Ambulatory Visit (HOSPITAL_COMMUNITY)
Admission: RE | Admit: 2024-02-11 | Discharge: 2024-02-11 | Disposition: A | Discharge status: Home / Self Care | Source: Ambulatory Visit | Attending: Nephrology | Admitting: Nephrology

## 2024-02-11 DIAGNOSIS — N184 Chronic kidney disease, stage 4 (severe): Secondary | ICD-10-CM | POA: Diagnosis not present

## 2024-02-11 DIAGNOSIS — D631 Anemia in chronic kidney disease: Secondary | ICD-10-CM | POA: Diagnosis not present

## 2024-02-11 DIAGNOSIS — N185 Chronic kidney disease, stage 5: Secondary | ICD-10-CM

## 2024-02-11 LAB — POCT HEMOGLOBIN-HEMACUE: Hemoglobin: 10.8 g/dL — ABNORMAL LOW (ref 13.0–17.0)

## 2024-02-11 MED ORDER — EPOETIN ALFA-EPBX 10000 UNIT/ML IJ SOLN
INTRAMUSCULAR | Status: AC
  Filled 2024-02-11: qty 1

## 2024-02-11 MED ORDER — EPOETIN ALFA-EPBX 10000 UNIT/ML IJ SOLN
10000.0000 [IU] | Freq: Once | INTRAMUSCULAR | Status: AC
  Administered 2024-02-11: 10000 [IU] via SUBCUTANEOUS

## 2024-02-16 DIAGNOSIS — I120 Hypertensive chronic kidney disease with stage 5 chronic kidney disease or end stage renal disease: Secondary | ICD-10-CM | POA: Diagnosis not present

## 2024-02-16 DIAGNOSIS — E785 Hyperlipidemia, unspecified: Secondary | ICD-10-CM | POA: Diagnosis not present

## 2024-02-16 DIAGNOSIS — E1122 Type 2 diabetes mellitus with diabetic chronic kidney disease: Secondary | ICD-10-CM | POA: Diagnosis not present

## 2024-02-16 DIAGNOSIS — D649 Anemia, unspecified: Secondary | ICD-10-CM | POA: Diagnosis not present

## 2024-02-16 DIAGNOSIS — N185 Chronic kidney disease, stage 5: Secondary | ICD-10-CM | POA: Diagnosis not present

## 2024-02-16 DIAGNOSIS — N2581 Secondary hyperparathyroidism of renal origin: Secondary | ICD-10-CM | POA: Diagnosis not present

## 2024-02-25 ENCOUNTER — Ambulatory Visit (HOSPITAL_COMMUNITY)
Admission: RE | Admit: 2024-02-25 | Discharge: 2024-02-25 | Disposition: A | Discharge status: Home / Self Care | Source: Ambulatory Visit | Attending: Nephrology | Admitting: Nephrology

## 2024-02-25 VITALS — BP 131/57 | HR 74 | Temp 97.3°F | Resp 16

## 2024-02-25 DIAGNOSIS — N185 Chronic kidney disease, stage 5: Secondary | ICD-10-CM | POA: Insufficient documentation

## 2024-02-25 DIAGNOSIS — D631 Anemia in chronic kidney disease: Secondary | ICD-10-CM | POA: Diagnosis present

## 2024-02-25 LAB — RENAL FUNCTION PANEL
Albumin: 3.6 g/dL (ref 3.5–5.0)
Anion gap: 15 (ref 5–15)
BUN: 71 mg/dL — ABNORMAL HIGH (ref 8–23)
CO2: 23 mmol/L (ref 22–32)
Calcium: 9.4 mg/dL (ref 8.9–10.3)
Chloride: 105 mmol/L (ref 98–111)
Creatinine, Ser: 6.6 mg/dL — ABNORMAL HIGH (ref 0.61–1.24)
GFR, Estimated: 8 mL/min — ABNORMAL LOW (ref >60)
Glucose, Bld: 132 mg/dL — ABNORMAL HIGH (ref 70–99)
Phosphorus: 4.9 mg/dL — ABNORMAL HIGH (ref 2.5–4.6)
Potassium: 3.6 mmol/L (ref 3.5–5.1)
Sodium: 143 mmol/L (ref 135–145)

## 2024-02-25 LAB — IRON AND TIBC
Iron: 85 ug/dL (ref 45–182)
Saturation Ratios: 43 % — ABNORMAL HIGH (ref 17.9–39.5)
TIBC: 197 ug/dL — ABNORMAL LOW (ref 250–450)
UIBC: 112 ug/dL

## 2024-02-25 LAB — POCT HEMOGLOBIN-HEMACUE: Hemoglobin: 10.8 g/dL — ABNORMAL LOW (ref 13.0–17.0)

## 2024-02-25 LAB — FERRITIN: Ferritin: 282 ng/mL (ref 24–336)

## 2024-02-25 LAB — HEPATITIS B SURFACE ANTIGEN: Hepatitis B Surface Ag: NONREACTIVE

## 2024-02-25 MED ORDER — EPOETIN ALFA-EPBX 10000 UNIT/ML IJ SOLN
INTRAMUSCULAR | Status: AC
  Filled 2024-02-25: qty 1

## 2024-02-25 MED ORDER — EPOETIN ALFA-EPBX 10000 UNIT/ML IJ SOLN
10000.0000 [IU] | Freq: Once | INTRAMUSCULAR | Status: AC
  Administered 2024-02-25: 10000 [IU] via SUBCUTANEOUS

## 2024-02-26 LAB — PTH, INTACT AND CALCIUM
Calcium, Total (PTH): 9.3 mg/dL (ref 8.6–10.2)
PTH: 328 pg/mL — ABNORMAL HIGH (ref 15–65)

## 2024-02-28 ENCOUNTER — Ambulatory Visit (HOSPITAL_COMMUNITY)
Admission: RE | Admit: 2024-02-28 | Discharge: 2024-02-28 | Disposition: A | Discharge status: Home / Self Care | Source: Ambulatory Visit | Attending: Surgery | Admitting: Surgery

## 2024-02-28 ENCOUNTER — Other Ambulatory Visit (HOSPITAL_COMMUNITY): Payer: Self-pay | Admitting: Family Medicine

## 2024-02-28 DIAGNOSIS — I739 Peripheral vascular disease, unspecified: Secondary | ICD-10-CM

## 2024-02-28 LAB — VAS US ABI WITH/WO TBI

## 2024-03-10 ENCOUNTER — Encounter (HOSPITAL_COMMUNITY)
Admission: RE | Admit: 2024-03-10 | Discharge: 2024-03-10 | Disposition: A | Discharge status: Home / Self Care | Source: Ambulatory Visit | Attending: Nephrology | Admitting: Nephrology

## 2024-03-10 VITALS — BP 116/60 | HR 65 | Temp 97.3°F | Resp 16

## 2024-03-10 DIAGNOSIS — D631 Anemia in chronic kidney disease: Secondary | ICD-10-CM | POA: Diagnosis present

## 2024-03-10 DIAGNOSIS — N185 Chronic kidney disease, stage 5: Secondary | ICD-10-CM | POA: Diagnosis present

## 2024-03-10 LAB — POCT HEMOGLOBIN-HEMACUE: Hemoglobin: 9.8 g/dL — ABNORMAL LOW (ref 13.0–17.0)

## 2024-03-10 MED ORDER — EPOETIN ALFA-EPBX 10000 UNIT/ML IJ SOLN
INTRAMUSCULAR | Status: AC
  Filled 2024-03-10: qty 1

## 2024-03-10 MED ORDER — EPOETIN ALFA-EPBX 10000 UNIT/ML IJ SOLN
10000.0000 [IU] | Freq: Once | INTRAMUSCULAR | Status: AC
  Administered 2024-03-10: 10000 [IU] via SUBCUTANEOUS

## 2024-03-24 ENCOUNTER — Encounter (HOSPITAL_COMMUNITY)
Admission: RE | Admit: 2024-03-24 | Discharge: 2024-03-24 | Disposition: A | Discharge status: Home / Self Care | Source: Ambulatory Visit | Attending: Nephrology | Admitting: Nephrology

## 2024-03-24 VITALS — BP 105/54 | HR 67 | Temp 97.8°F | Resp 16

## 2024-03-24 DIAGNOSIS — N185 Chronic kidney disease, stage 5: Secondary | ICD-10-CM | POA: Diagnosis not present

## 2024-03-24 DIAGNOSIS — D631 Anemia in chronic kidney disease: Secondary | ICD-10-CM

## 2024-03-24 LAB — POCT HEMOGLOBIN-HEMACUE: Hemoglobin: 10.2 g/dL — ABNORMAL LOW (ref 13.0–17.0)

## 2024-03-24 MED ORDER — EPOETIN ALFA-EPBX 10000 UNIT/ML IJ SOLN
INTRAMUSCULAR | Status: AC
  Filled 2024-03-24: qty 1

## 2024-03-24 MED ORDER — EPOETIN ALFA-EPBX 10000 UNIT/ML IJ SOLN
10000.0000 [IU] | Freq: Once | INTRAMUSCULAR | Status: AC
  Administered 2024-03-24: 10000 [IU] via SUBCUTANEOUS

## 2024-04-07 ENCOUNTER — Ambulatory Visit (HOSPITAL_COMMUNITY)
Admission: RE | Admit: 2024-04-07 | Discharge: 2024-04-07 | Disposition: A | Discharge status: Home / Self Care | Source: Ambulatory Visit | Attending: Nephrology | Admitting: Nephrology

## 2024-04-07 VITALS — BP 105/53 | HR 70 | Temp 97.3°F | Resp 16

## 2024-04-07 DIAGNOSIS — D631 Anemia in chronic kidney disease: Secondary | ICD-10-CM | POA: Diagnosis present

## 2024-04-07 DIAGNOSIS — N185 Chronic kidney disease, stage 5: Secondary | ICD-10-CM | POA: Diagnosis present

## 2024-04-07 LAB — RENAL FUNCTION PANEL
Albumin: 3.3 g/dL — ABNORMAL LOW (ref 3.5–5.0)
Anion gap: 13 (ref 5–15)
BUN: 65 mg/dL — ABNORMAL HIGH (ref 8–23)
CO2: 20 mmol/L — ABNORMAL LOW (ref 22–32)
Calcium: 9.1 mg/dL (ref 8.9–10.3)
Chloride: 111 mmol/L (ref 98–111)
Creatinine, Ser: 6.4 mg/dL — ABNORMAL HIGH (ref 0.61–1.24)
GFR, Estimated: 8 mL/min — ABNORMAL LOW (ref >60)
Glucose, Bld: 87 mg/dL (ref 70–99)
Phosphorus: 6 mg/dL — ABNORMAL HIGH (ref 2.5–4.6)
Potassium: 3.9 mmol/L (ref 3.5–5.1)
Sodium: 144 mmol/L (ref 135–145)

## 2024-04-07 LAB — POCT HEMOGLOBIN-HEMACUE: Hemoglobin: 9.8 g/dL — ABNORMAL LOW (ref 13.0–17.0)

## 2024-04-07 LAB — IRON AND TIBC
Iron: 65 ug/dL (ref 45–182)
Saturation Ratios: 33 % (ref 17.9–39.5)
TIBC: 195 ug/dL — ABNORMAL LOW (ref 250–450)
UIBC: 130 ug/dL

## 2024-04-07 LAB — FERRITIN: Ferritin: 237 ng/mL (ref 24–336)

## 2024-04-07 LAB — HEPATITIS B SURFACE ANTIGEN: Hepatitis B Surface Ag: NONREACTIVE

## 2024-04-07 MED ORDER — EPOETIN ALFA-EPBX 10000 UNIT/ML IJ SOLN
INTRAMUSCULAR | Status: AC
Start: 2024-04-07 — End: 2024-04-07
  Filled 2024-04-07: qty 1

## 2024-04-07 MED ORDER — EPOETIN ALFA-EPBX 10000 UNIT/ML IJ SOLN
10000.0000 [IU] | Freq: Once | INTRAMUSCULAR | Status: AC
  Administered 2024-04-07: 10000 [IU] via SUBCUTANEOUS

## 2024-04-08 LAB — PTH, INTACT AND CALCIUM
Calcium, Total (PTH): 9.3 mg/dL (ref 8.6–10.2)
PTH: 257 pg/mL — ABNORMAL HIGH (ref 15–65)

## 2024-04-24 ENCOUNTER — Ambulatory Visit (HOSPITAL_COMMUNITY)
Admission: RE | Admit: 2024-04-24 | Discharge: 2024-04-24 | Disposition: A | Discharge status: Home / Self Care | Source: Ambulatory Visit | Attending: Nephrology | Admitting: Nephrology

## 2024-04-24 VITALS — BP 130/60 | HR 80 | Temp 97.3°F | Resp 16

## 2024-04-24 DIAGNOSIS — N185 Chronic kidney disease, stage 5: Secondary | ICD-10-CM | POA: Diagnosis present

## 2024-04-24 DIAGNOSIS — D631 Anemia in chronic kidney disease: Secondary | ICD-10-CM | POA: Diagnosis present

## 2024-04-24 LAB — POCT HEMOGLOBIN-HEMACUE: Hemoglobin: 9 g/dL — ABNORMAL LOW (ref 13.0–17.0)

## 2024-04-24 MED ORDER — EPOETIN ALFA-EPBX 10000 UNIT/ML IJ SOLN
10000.0000 [IU] | Freq: Once | INTRAMUSCULAR | Status: AC
  Administered 2024-04-24: 10000 [IU] via SUBCUTANEOUS

## 2024-04-24 MED ORDER — EPOETIN ALFA-EPBX 10000 UNIT/ML IJ SOLN
INTRAMUSCULAR | Status: AC
  Filled 2024-04-24: qty 1

## 2024-04-28 ENCOUNTER — Inpatient Hospital Stay (HOSPITAL_COMMUNITY)
Admission: EM | Admit: 2024-04-28 | Discharge: 2024-05-11 | Disposition: A | Discharge status: Skilled Nursing Facility | Source: Home / Self Care | Attending: Family Medicine | Admitting: Family Medicine

## 2024-04-28 ENCOUNTER — Encounter (HOSPITAL_COMMUNITY): Payer: Self-pay | Admitting: *Deleted

## 2024-04-28 ENCOUNTER — Other Ambulatory Visit: Payer: Self-pay

## 2024-04-28 ENCOUNTER — Emergency Department (HOSPITAL_COMMUNITY)

## 2024-04-28 DIAGNOSIS — D539 Nutritional anemia, unspecified: Secondary | ICD-10-CM | POA: Diagnosis present

## 2024-04-28 DIAGNOSIS — Z87891 Personal history of nicotine dependence: Secondary | ICD-10-CM

## 2024-04-28 DIAGNOSIS — F05 Delirium due to known physiological condition: Secondary | ICD-10-CM | POA: Diagnosis present

## 2024-04-28 DIAGNOSIS — Y92008 Other place in unspecified non-institutional (private) residence as the place of occurrence of the external cause: Secondary | ICD-10-CM | POA: Diagnosis not present

## 2024-04-28 DIAGNOSIS — E1165 Type 2 diabetes mellitus with hyperglycemia: Secondary | ICD-10-CM | POA: Diagnosis present

## 2024-04-28 DIAGNOSIS — F32A Depression, unspecified: Secondary | ICD-10-CM | POA: Diagnosis present

## 2024-04-28 DIAGNOSIS — Z833 Family history of diabetes mellitus: Secondary | ICD-10-CM

## 2024-04-28 DIAGNOSIS — Z789 Other specified health status: Secondary | ICD-10-CM | POA: Diagnosis not present

## 2024-04-28 DIAGNOSIS — M1A9XX0 Chronic gout, unspecified, without tophus (tophi): Secondary | ICD-10-CM | POA: Diagnosis present

## 2024-04-28 DIAGNOSIS — X31XXXA Exposure to excessive natural cold, initial encounter: Secondary | ICD-10-CM | POA: Diagnosis not present

## 2024-04-28 DIAGNOSIS — N186 End stage renal disease: Secondary | ICD-10-CM | POA: Diagnosis present

## 2024-04-28 DIAGNOSIS — R6510 Systemic inflammatory response syndrome (SIRS) of non-infectious origin without acute organ dysfunction: Secondary | ICD-10-CM | POA: Diagnosis present

## 2024-04-28 DIAGNOSIS — E1122 Type 2 diabetes mellitus with diabetic chronic kidney disease: Secondary | ICD-10-CM | POA: Diagnosis present

## 2024-04-28 DIAGNOSIS — N185 Chronic kidney disease, stage 5: Secondary | ICD-10-CM | POA: Diagnosis not present

## 2024-04-28 DIAGNOSIS — I10 Essential (primary) hypertension: Secondary | ICD-10-CM | POA: Diagnosis not present

## 2024-04-28 DIAGNOSIS — E875 Hyperkalemia: Secondary | ICD-10-CM | POA: Diagnosis present

## 2024-04-28 DIAGNOSIS — E11649 Type 2 diabetes mellitus with hypoglycemia without coma: Secondary | ICD-10-CM | POA: Diagnosis not present

## 2024-04-28 DIAGNOSIS — G9341 Metabolic encephalopathy: Secondary | ICD-10-CM | POA: Diagnosis present

## 2024-04-28 DIAGNOSIS — Z992 Dependence on renal dialysis: Secondary | ICD-10-CM | POA: Diagnosis not present

## 2024-04-28 DIAGNOSIS — T82868A Thrombosis of vascular prosthetic devices, implants and grafts, initial encounter: Secondary | ICD-10-CM | POA: Diagnosis not present

## 2024-04-28 DIAGNOSIS — Z66 Do not resuscitate: Secondary | ICD-10-CM | POA: Diagnosis present

## 2024-04-28 DIAGNOSIS — Y92239 Unspecified place in hospital as the place of occurrence of the external cause: Secondary | ICD-10-CM | POA: Diagnosis not present

## 2024-04-28 DIAGNOSIS — Z6837 Body mass index (BMI) 37.0-37.9, adult: Secondary | ICD-10-CM

## 2024-04-28 DIAGNOSIS — Z7409 Other reduced mobility: Secondary | ICD-10-CM | POA: Diagnosis present

## 2024-04-28 DIAGNOSIS — T68XXXA Hypothermia, initial encounter: Secondary | ICD-10-CM | POA: Diagnosis present

## 2024-04-28 DIAGNOSIS — Z91158 Patient's noncompliance with renal dialysis for other reason: Secondary | ICD-10-CM

## 2024-04-28 DIAGNOSIS — T82898A Other specified complication of vascular prosthetic devices, implants and grafts, initial encounter: Secondary | ICD-10-CM | POA: Diagnosis not present

## 2024-04-28 DIAGNOSIS — T8241XA Breakdown (mechanical) of vascular dialysis catheter, initial encounter: Secondary | ICD-10-CM | POA: Diagnosis not present

## 2024-04-28 DIAGNOSIS — E119 Type 2 diabetes mellitus without complications: Secondary | ICD-10-CM

## 2024-04-28 DIAGNOSIS — Z79899 Other long term (current) drug therapy: Secondary | ICD-10-CM

## 2024-04-28 DIAGNOSIS — E663 Overweight: Secondary | ICD-10-CM | POA: Diagnosis present

## 2024-04-28 DIAGNOSIS — Y841 Kidney dialysis as the cause of abnormal reaction of the patient, or of later complication, without mention of misadventure at the time of the procedure: Secondary | ICD-10-CM | POA: Diagnosis not present

## 2024-04-28 DIAGNOSIS — M109 Gout, unspecified: Secondary | ICD-10-CM | POA: Clinically undetermined

## 2024-04-28 DIAGNOSIS — R4689 Other symptoms and signs involving appearance and behavior: Secondary | ICD-10-CM

## 2024-04-28 DIAGNOSIS — D631 Anemia in chronic kidney disease: Secondary | ICD-10-CM | POA: Diagnosis present

## 2024-04-28 DIAGNOSIS — E8721 Acute metabolic acidosis: Secondary | ICD-10-CM | POA: Diagnosis present

## 2024-04-28 DIAGNOSIS — Z515 Encounter for palliative care: Secondary | ICD-10-CM | POA: Diagnosis not present

## 2024-04-28 DIAGNOSIS — Z8249 Family history of ischemic heart disease and other diseases of the circulatory system: Secondary | ICD-10-CM

## 2024-04-28 DIAGNOSIS — I120 Hypertensive chronic kidney disease with stage 5 chronic kidney disease or end stage renal disease: Principal | ICD-10-CM | POA: Diagnosis present

## 2024-04-28 DIAGNOSIS — F22 Delusional disorders: Secondary | ICD-10-CM

## 2024-04-28 DIAGNOSIS — Z794 Long term (current) use of insulin: Secondary | ICD-10-CM

## 2024-04-28 DIAGNOSIS — Z781 Physical restraint status: Secondary | ICD-10-CM

## 2024-04-28 DIAGNOSIS — E7800 Pure hypercholesterolemia, unspecified: Secondary | ICD-10-CM | POA: Diagnosis present

## 2024-04-28 DIAGNOSIS — R4182 Altered mental status, unspecified: Secondary | ICD-10-CM | POA: Diagnosis not present

## 2024-04-28 DIAGNOSIS — E1140 Type 2 diabetes mellitus with diabetic neuropathy, unspecified: Secondary | ICD-10-CM | POA: Diagnosis present

## 2024-04-28 DIAGNOSIS — Z7189 Other specified counseling: Secondary | ICD-10-CM | POA: Diagnosis not present

## 2024-04-28 DIAGNOSIS — F03918 Unspecified dementia, unspecified severity, with other behavioral disturbance: Secondary | ICD-10-CM | POA: Diagnosis not present

## 2024-04-28 DIAGNOSIS — R001 Bradycardia, unspecified: Secondary | ICD-10-CM | POA: Diagnosis present

## 2024-04-28 DIAGNOSIS — E876 Hypokalemia: Secondary | ICD-10-CM | POA: Diagnosis not present

## 2024-04-28 DIAGNOSIS — Z634 Disappearance and death of family member: Secondary | ICD-10-CM

## 2024-04-28 DIAGNOSIS — Z7982 Long term (current) use of aspirin: Secondary | ICD-10-CM

## 2024-04-28 LAB — URINALYSIS, ROUTINE W REFLEX MICROSCOPIC
Bilirubin Urine: NEGATIVE
Glucose, UA: NEGATIVE mg/dL
Ketones, ur: NEGATIVE mg/dL
Leukocytes,Ua: NEGATIVE
Nitrite: NEGATIVE
Protein, ur: 100 mg/dL — AB
Specific Gravity, Urine: 1.02 (ref 1.005–1.030)
pH: 6 (ref 5.0–8.0)

## 2024-04-28 LAB — ETHANOL: Alcohol, Ethyl (B): <15 mg/dL (ref <15)

## 2024-04-28 LAB — COMPREHENSIVE METABOLIC PANEL WITH GFR
ALT: 12 U/L (ref 0–44)
AST: 19 U/L (ref 15–41)
Albumin: 3.4 g/dL — ABNORMAL LOW (ref 3.5–5.0)
Alkaline Phosphatase: 59 U/L (ref 38–126)
Anion gap: 19 — ABNORMAL HIGH (ref 5–15)
BUN: 61 mg/dL — ABNORMAL HIGH (ref 8–23)
CO2: 21 mmol/L — ABNORMAL LOW (ref 22–32)
Calcium: 10 mg/dL (ref 8.9–10.3)
Chloride: 105 mmol/L (ref 98–111)
Creatinine, Ser: 6.62 mg/dL — ABNORMAL HIGH (ref 0.61–1.24)
GFR, Estimated: 8 mL/min — ABNORMAL LOW (ref >60)
Glucose, Bld: 277 mg/dL — ABNORMAL HIGH (ref 70–99)
Potassium: 5.2 mmol/L — ABNORMAL HIGH (ref 3.5–5.1)
Sodium: 145 mmol/L (ref 135–145)
Total Bilirubin: 1 mg/dL (ref 0.0–1.2)
Total Protein: 6.9 g/dL (ref 6.5–8.1)

## 2024-04-28 LAB — RAPID URINE DRUG SCREEN, HOSP PERFORMED
Amphetamines: NOT DETECTED
Barbiturates: NOT DETECTED
Benzodiazepines: NOT DETECTED
Cocaine: NOT DETECTED
Opiates: NOT DETECTED
Tetrahydrocannabinol: NOT DETECTED

## 2024-04-28 LAB — CBC WITH DIFFERENTIAL/PLATELET
Abs Immature Granulocytes: 0.02 K/uL (ref 0.00–0.07)
Basophils Absolute: 0 K/uL (ref 0.0–0.1)
Basophils Relative: 0 %
Eosinophils Absolute: 0 K/uL (ref 0.0–0.5)
Eosinophils Relative: 1 %
HCT: 34.1 % — ABNORMAL LOW (ref 39.0–52.0)
Hemoglobin: 10.6 g/dL — ABNORMAL LOW (ref 13.0–17.0)
Immature Granulocytes: 0 %
Lymphocytes Relative: 25 %
Lymphs Abs: 1.2 K/uL (ref 0.7–4.0)
MCH: 31.4 pg (ref 26.0–34.0)
MCHC: 31.1 g/dL (ref 30.0–36.0)
MCV: 100.9 fL — ABNORMAL HIGH (ref 80.0–100.0)
Monocytes Absolute: 0.1 K/uL (ref 0.1–1.0)
Monocytes Relative: 3 %
Neutro Abs: 3.5 K/uL (ref 1.7–7.7)
Neutrophils Relative %: 71 %
Platelets: 113 K/uL — ABNORMAL LOW (ref 150–400)
RBC: 3.38 MIL/uL — ABNORMAL LOW (ref 4.22–5.81)
RDW: 14.3 % (ref 11.5–15.5)
WBC: 4.8 K/uL (ref 4.0–10.5)
nRBC: 0 % (ref 0.0–0.2)

## 2024-04-28 LAB — I-STAT CHEM 8, ED
BUN: 58 mg/dL — ABNORMAL HIGH (ref 8–23)
Calcium, Ion: 1.21 mmol/L (ref 1.15–1.40)
Chloride: 109 mmol/L (ref 98–111)
Creatinine, Ser: 6.6 mg/dL — ABNORMAL HIGH (ref 0.61–1.24)
Glucose, Bld: 262 mg/dL — ABNORMAL HIGH (ref 70–99)
HCT: 36 % — ABNORMAL LOW (ref 39.0–52.0)
Hemoglobin: 12.2 g/dL — ABNORMAL LOW (ref 13.0–17.0)
Potassium: 5.2 mmol/L — ABNORMAL HIGH (ref 3.5–5.1)
Sodium: 145 mmol/L (ref 135–145)
TCO2: 22 mmol/L (ref 22–32)

## 2024-04-28 LAB — TSH: TSH: 3.21 u[IU]/mL (ref 0.350–4.500)

## 2024-04-28 LAB — BASIC METABOLIC PANEL WITH GFR
Anion gap: 10 (ref 5–15)
BUN: 63 mg/dL — ABNORMAL HIGH (ref 8–23)
CO2: 20 mmol/L — ABNORMAL LOW (ref 22–32)
Calcium: 8.9 mg/dL (ref 8.9–10.3)
Chloride: 115 mmol/L — ABNORMAL HIGH (ref 98–111)
Creatinine, Ser: 6.32 mg/dL — ABNORMAL HIGH (ref 0.61–1.24)
GFR, Estimated: 9 mL/min — ABNORMAL LOW (ref >60)
Glucose, Bld: 84 mg/dL (ref 70–99)
Potassium: 4.3 mmol/L (ref 3.5–5.1)
Sodium: 145 mmol/L (ref 135–145)

## 2024-04-28 LAB — GLUCOSE, CAPILLARY: Glucose-Capillary: 131 mg/dL — ABNORMAL HIGH (ref 70–99)

## 2024-04-28 LAB — URINALYSIS, MICROSCOPIC (REFLEX): Bacteria, UA: NONE SEEN

## 2024-04-28 LAB — PHOSPHORUS: Phosphorus: 4.5 mg/dL (ref 2.5–4.6)

## 2024-04-28 LAB — CBG MONITORING, ED
Glucose-Capillary: 154 mg/dL — ABNORMAL HIGH (ref 70–99)
Glucose-Capillary: 69 mg/dL — ABNORMAL LOW (ref 70–99)
Glucose-Capillary: 88 mg/dL (ref 70–99)

## 2024-04-28 LAB — I-STAT CG4 LACTIC ACID, ED: Lactic Acid, Venous: 1.1 mmol/L (ref 0.5–1.9)

## 2024-04-28 LAB — MAGNESIUM: Magnesium: 2.2 mg/dL (ref 1.7–2.4)

## 2024-04-28 LAB — HEMOGLOBIN A1C
Hgb A1c MFr Bld: 4.3 % — ABNORMAL LOW (ref 4.8–5.6)
Mean Plasma Glucose: 76.71 mg/dL

## 2024-04-28 LAB — LACTIC ACID, PLASMA: Lactic Acid, Venous: 1.3 mmol/L (ref 0.5–1.9)

## 2024-04-28 LAB — PROCALCITONIN: Procalcitonin: 0.4 ng/mL

## 2024-04-28 LAB — AMMONIA: Ammonia: 19 umol/L (ref 9–35)

## 2024-04-28 MED ORDER — FLEET ENEMA RE ENEM: 1.0000 | ENEMA | Freq: Once | RECTAL | Status: DC | PRN

## 2024-04-28 MED ORDER — LACTATED RINGERS IV SOLN
150.0000 mL/h | INTRAVENOUS | Status: AC
  Administered 2024-04-28: 150 mL/h via INTRAVENOUS

## 2024-04-28 MED ORDER — ADULT MULTIVITAMIN W/MINERALS CH
1.0000 | ORAL_TABLET | Freq: Every day | ORAL | Status: DC
  Administered 2024-04-28 – 2024-05-11 (×12): 1 via ORAL
  Filled 2024-04-28 (×12): qty 1

## 2024-04-28 MED ORDER — ALLOPURINOL 100 MG PO TABS
100.0000 mg | ORAL_TABLET | Freq: Every day | ORAL | Status: DC
  Administered 2024-04-30 – 2024-05-11 (×11): 100 mg via ORAL
  Filled 2024-04-28 (×11): qty 1

## 2024-04-28 MED ORDER — SODIUM CHLORIDE 0.9 % IV SOLN
2.0000 g | Freq: Once | INTRAVENOUS | Status: AC
  Administered 2024-04-28: 2 g via INTRAVENOUS
  Filled 2024-04-28: qty 12.5

## 2024-04-28 MED ORDER — ALLOPURINOL 100 MG PO TABS: 300.0000 mg | ORAL_TABLET | Freq: Every day | ORAL | Status: DC

## 2024-04-28 MED ORDER — HEPARIN SODIUM (PORCINE) 5000 UNIT/ML IJ SOLN
5000.0000 [IU] | Freq: Three times a day (TID) | INTRAMUSCULAR | Status: DC
  Administered 2024-04-28 – 2024-05-11 (×36): 5000 [IU] via SUBCUTANEOUS
  Filled 2024-04-28 (×38): qty 1

## 2024-04-28 MED ORDER — ACETAMINOPHEN 325 MG PO TABS
650.0000 mg | ORAL_TABLET | Freq: Four times a day (QID) | ORAL | Status: DC | PRN
  Administered 2024-05-05 – 2024-05-07 (×3): 650 mg via ORAL
  Filled 2024-04-28 (×3): qty 2

## 2024-04-28 MED ORDER — HYDRALAZINE HCL 20 MG/ML IJ SOLN: 10.0000 mg | INTRAMUSCULAR | Status: DC | PRN

## 2024-04-28 MED ORDER — SODIUM CHLORIDE 0.9 % IV SOLN: INTRAVENOUS | Status: DC

## 2024-04-28 MED ORDER — BISACODYL 5 MG PO TBEC
5.0000 mg | DELAYED_RELEASE_TABLET | Freq: Every day | ORAL | Status: DC | PRN
  Administered 2024-05-08 – 2024-05-10 (×3): 5 mg via ORAL
  Filled 2024-04-28 (×3): qty 1

## 2024-04-28 MED ORDER — FUROSEMIDE 40 MG PO TABS: 80.0000 mg | ORAL_TABLET | Freq: Every morning | ORAL | Status: DC

## 2024-04-28 MED ORDER — LACTATED RINGERS IV SOLN: 150.0000 mL/h | INTRAVENOUS | Status: DC

## 2024-04-28 MED ORDER — CALCITRIOL 0.25 MCG PO CAPS
0.2500 ug | ORAL_CAPSULE | ORAL | Status: DC
  Administered 2024-04-28 – 2024-05-10 (×6): 0.25 ug via ORAL
  Filled 2024-04-28 (×7): qty 1

## 2024-04-28 MED ORDER — SERTRALINE HCL 50 MG PO TABS
25.0000 mg | ORAL_TABLET | Freq: Every day | ORAL | Status: DC
  Administered 2024-04-28 – 2024-05-11 (×12): 25 mg via ORAL
  Filled 2024-04-28 (×12): qty 1

## 2024-04-28 MED ORDER — VANCOMYCIN HCL 2000 MG/400ML IV SOLN
2000.0000 mg | Freq: Once | INTRAVENOUS | Status: AC
  Administered 2024-04-28: 2000 mg via INTRAVENOUS
  Filled 2024-04-28: qty 400

## 2024-04-28 MED ORDER — LACTATED RINGERS IV BOLUS (SEPSIS)
1000.0000 mL | Freq: Once | INTRAVENOUS | Status: AC
  Administered 2024-04-28: 1000 mL via INTRAVENOUS

## 2024-04-28 MED ORDER — VANCOMYCIN VARIABLE DOSE PER UNSTABLE RENAL FUNCTION (PHARMACIST DOSING): Status: DC

## 2024-04-28 MED ORDER — LACTATED RINGERS IV BOLUS (SEPSIS): 1000.0000 mL | Freq: Once | INTRAVENOUS | Status: DC

## 2024-04-28 MED ORDER — ONDANSETRON HCL 4 MG PO TABS: 4.0000 mg | ORAL_TABLET | Freq: Four times a day (QID) | ORAL | Status: AC | PRN

## 2024-04-28 MED ORDER — ONDANSETRON HCL 4 MG/2ML IJ SOLN: 4.0000 mg | Freq: Four times a day (QID) | INTRAMUSCULAR | Status: DC | PRN

## 2024-04-28 MED ORDER — SODIUM CHLORIDE 0.9 % IV BOLUS
500.0000 mL | Freq: Once | INTRAVENOUS | Status: AC
  Administered 2024-04-28: 500 mL via INTRAVENOUS

## 2024-04-28 MED ORDER — ACETAMINOPHEN 650 MG RE SUPP: 650.0000 mg | Freq: Four times a day (QID) | RECTAL | Status: DC | PRN

## 2024-04-28 MED ORDER — SENNOSIDES-DOCUSATE SODIUM 8.6-50 MG PO TABS: 1.0000 | ORAL_TABLET | Freq: Every evening | ORAL | Status: DC | PRN

## 2024-04-28 MED ORDER — SODIUM CHLORIDE 0.9 % IV SOLN
2.0000 g | INTRAVENOUS | Status: DC
  Filled 2024-04-28: qty 12.5

## 2024-04-28 MED ORDER — METRONIDAZOLE 500 MG/100ML IV SOLN
500.0000 mg | Freq: Two times a day (BID) | INTRAVENOUS | Status: DC
  Administered 2024-04-28 (×2): 500 mg via INTRAVENOUS
  Filled 2024-04-28 (×3): qty 100

## 2024-04-28 MED ORDER — INSULIN GLARGINE 100 UNIT/ML ~~LOC~~ SOLN
9.0000 [IU] | Freq: Every day | SUBCUTANEOUS | Status: DC
  Administered 2024-04-28: 9 [IU] via SUBCUTANEOUS
  Filled 2024-04-28 (×2): qty 0.09

## 2024-04-28 MED ORDER — FUROSEMIDE 40 MG PO TABS
40.0000 mg | ORAL_TABLET | Freq: Every evening | ORAL | Status: DC
  Administered 2024-04-28: 40 mg via ORAL
  Filled 2024-04-28: qty 1

## 2024-04-28 MED ORDER — ASPIRIN 81 MG PO TBEC
81.0000 mg | DELAYED_RELEASE_TABLET | Freq: Every day | ORAL | Status: DC
  Administered 2024-04-28 – 2024-05-11 (×12): 81 mg via ORAL
  Filled 2024-04-28 (×12): qty 1

## 2024-04-28 MED ORDER — ATORVASTATIN CALCIUM 40 MG PO TABS
40.0000 mg | ORAL_TABLET | Freq: Every day | ORAL | Status: DC
  Administered 2024-04-28 – 2024-05-11 (×12): 40 mg via ORAL
  Filled 2024-04-28: qty 1
  Filled 2024-04-28 (×2): qty 4
  Filled 2024-04-28: qty 1
  Filled 2024-04-28 (×2): qty 4
  Filled 2024-04-28 (×2): qty 1
  Filled 2024-04-28 (×2): qty 4
  Filled 2024-04-28: qty 1
  Filled 2024-04-28: qty 4

## 2024-04-28 MED ORDER — AMLODIPINE BESYLATE 5 MG PO TABS
10.0000 mg | ORAL_TABLET | Freq: Every day | ORAL | Status: DC
  Administered 2024-04-28: 10 mg via ORAL
  Filled 2024-04-28: qty 2

## 2024-04-28 MED ORDER — SODIUM CHLORIDE 0.9% FLUSH: 3.0000 mL | Freq: Two times a day (BID) | INTRAVENOUS | Status: DC

## 2024-04-28 MED ORDER — INSULIN ASPART 100 UNIT/ML IJ SOLN
0.0000 [IU] | INTRAMUSCULAR | Status: DC
  Administered 2024-04-28 – 2024-05-04 (×5): 1 [IU] via SUBCUTANEOUS
  Filled 2024-04-28 (×5): qty 1

## 2024-04-28 MED ORDER — TRAZODONE HCL 50 MG PO TABS: 25.0000 mg | ORAL_TABLET | Freq: Every evening | ORAL | Status: DC | PRN

## 2024-04-28 MED ORDER — FUROSEMIDE 20 MG PO TABS: 40.0000 mg | ORAL_TABLET | Freq: Every day | ORAL | Status: DC

## 2024-04-28 MED ORDER — STERILE WATER FOR INJECTION IJ SOLN
INTRAMUSCULAR | Status: AC
  Administered 2024-04-28: 2.1 mL
  Filled 2024-04-28: qty 10

## 2024-04-28 MED ORDER — SODIUM ZIRCONIUM CYCLOSILICATE 5 G PO PACK
5.0000 g | PACK | Freq: Once | ORAL | Status: AC
  Administered 2024-04-28: 5 g via ORAL
  Filled 2024-04-28 (×2): qty 1

## 2024-04-28 MED ORDER — SODIUM CHLORIDE 0.9% FLUSH
3.0000 mL | Freq: Two times a day (BID) | INTRAVENOUS | Status: DC
  Administered 2024-04-28 – 2024-05-10 (×19): 3 mL via INTRAVENOUS

## 2024-04-28 MED ORDER — OXYCODONE HCL 5 MG PO TABS
5.0000 mg | ORAL_TABLET | ORAL | Status: DC | PRN
  Administered 2024-05-05 – 2024-05-08 (×6): 5 mg via ORAL
  Filled 2024-04-28 (×6): qty 1

## 2024-04-28 MED ORDER — CARVEDILOL 25 MG PO TABS
25.0000 mg | ORAL_TABLET | Freq: Two times a day (BID) | ORAL | Status: DC
  Administered 2024-04-28 – 2024-05-10 (×20): 25 mg via ORAL
  Filled 2024-04-28 (×6): qty 2
  Filled 2024-04-28 (×2): qty 1
  Filled 2024-04-28 (×9): qty 2
  Filled 2024-04-28: qty 1
  Filled 2024-04-28 (×2): qty 2
  Filled 2024-04-28: qty 1

## 2024-04-28 MED ORDER — HALOPERIDOL LACTATE 5 MG/ML IJ SOLN
1.0000 mg | Freq: Four times a day (QID) | INTRAMUSCULAR | Status: DC | PRN
  Administered 2024-04-29: 1 mg via INTRAVENOUS
  Filled 2024-04-28: qty 1

## 2024-04-28 MED ORDER — HYDROMORPHONE HCL 1 MG/ML IJ SOLN: 0.5000 mg | INTRAMUSCULAR | Status: DC | PRN

## 2024-04-28 MED ORDER — OLANZAPINE 10 MG IM SOLR
2.5000 mg | Freq: Once | INTRAMUSCULAR | Status: AC
  Administered 2024-04-28: 2.5 mg via INTRAMUSCULAR
  Filled 2024-04-28: qty 10

## 2024-04-28 MED ORDER — IPRATROPIUM BROMIDE 0.02 % IN SOLN: 0.5000 mg | Freq: Four times a day (QID) | RESPIRATORY_TRACT | Status: DC | PRN

## 2024-04-28 MED ORDER — INSULIN GLARGINE 100 UNIT/ML ~~LOC~~ SOLN: 20.0000 [IU] | Freq: Every day | SUBCUTANEOUS | Status: DC

## 2024-04-28 NOTE — TOC CM/SW Note (Signed)
 TOC consult received for d/c planning needs. Possible outpt HD chair. Renal CM notified via Epic chat. Follow-up to be completed with patient as appropriate.   Merilee Batty, MSN, RN Case Management [phone]
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
## 2024-04-28 NOTE — Assessment & Plan Note (Signed)
 CKD stage V- ?  End-stage renal disease now Lab Results  Component Value Date   CREATININE 6.60 (H) 04/28/2024   CREATININE 6.62 (H) 04/28/2024   CREATININE 6.40 (H) 04/07/2024   Elevated BUN 65, 61, 58, GFR 8, 8 -Consulted nephrologist Dr. Jerrye Appreciate further evaluation and recommendations  Discussed with his daughter he has not been started on dialysis yet

## 2024-04-28 NOTE — ED Notes (Signed)
 PT's BGL was 88. Insulin  was withheld.

## 2024-04-28 NOTE — Assessment & Plan Note (Signed)
 Chronic history of gout, continue home medication of allopurinol  when awake alert, tolerating oral

## 2024-04-28 NOTE — Assessment & Plan Note (Signed)
-   Alterman status with confusion, agitation, hallucination -Per daughter this has happened once before -with delusion of that someone was in his house -similar episode now -CT of the head, no acute intracranial normalities, chronic changes -Unable to provide any history -Ruling out underlying infection  (empiricallyTreating with IV fluids, IV antibiotics) -Advanced kidney disease, with elevated potassium and BUN/creatinine -Obtaining ammonia level, TSH within normal limits

## 2024-04-28 NOTE — Plan of Care (Signed)
 Admitted from Emergency Department.  Alert and oriented.  Able to answer admission questions.  Skin intact. New open wounds noted.   Problem: Education: Goal: Ability to describe self-care measures that may prevent or decrease complications (Diabetes Survival Skills Education) will improve Outcome: Progressing Goal: Individualized Educational Video(s) Outcome: Progressing   Problem: Coping: Goal: Ability to adjust to condition or change in health will improve Outcome: Progressing   Problem: Fluid Volume: Goal: Ability to maintain a balanced intake and output will improve Outcome: Progressing   Problem: Fluid Volume: Goal: Hemodynamic stability will improve Outcome: Progressing   Problem: Clinical Measurements: Goal: Diagnostic test results will improve Outcome: Progressing Goal: Signs and symptoms of infection will decrease Outcome: Progressing   Problem: Respiratory: Goal: Ability to maintain adequate ventilation will improve Outcome: Progressing

## 2024-04-28 NOTE — Progress Notes (Signed)
 Pharmacy Antibiotic Note  Kelly Dunn is a 78 y.o. male admitted on 04/28/2024 with sepsis.  Pharmacy has been consulted for vancomycin  and cefepime  dosing. Unclear source, initially presented with change in mental status and agitation. Hx of CKD stage 5, however Scr and BUN elevated. Nephrology being consulted.   Plan: Cefepime  2g Q24H.  Vancomycin  2000mg  x1, repeat doses pending nephrology plan and renal fxn.  Flagyl  per MD.  Follow culture data for de-escalation.  Monitor renal function for dose adjustments as indicated.   Height: 6' (182.9 cm) Weight: 126 kg (277 lb 12.5 oz) IBW/kg (Calculated) : 77.6  Temp (24hrs), Avg:87.4 F (30.8 C), Min:86.1 F (30.1 C), Max:88.7 F (31.5 C)  Recent Labs  Lab 04/28/24 0439 04/28/24 0445  WBC 4.8  --   CREATININE 6.62* 6.60*    Estimated Creatinine Clearance: 13.1 mL/min (A) (by C-G formula based on SCr of 6.6 mg/dL (H)).    No Known Allergies  Antimicrobials this admission: Cefepime  12/5 >>  Vancomycin  12/5 >>  Flagyl  12/5 >>   Microbiology results: 12/5 BCx:  12/5 Sputum:    Thank you for allowing pharmacy to be a part of this patient's care.  Powell Blush, PharmD, BCCCP  04/28/2024 8:40 AM
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
## 2024-04-28 NOTE — Assessment & Plan Note (Signed)
 Continue home dose allopurinol  when tolerating p.o.

## 2024-04-28 NOTE — Assessment & Plan Note (Signed)
 Body mass index is 37.67 kg/m. - When mentally stable, can follow-up with PCP regarding diet, exercise, weight loss with

## 2024-04-28 NOTE — Assessment & Plan Note (Signed)
-   Currently stable, utilizing as needed IV hydralazine  -Once awake, tolerating p.o., will resume home medication including Norvasc , Coreg , - Holding Lasix  for now

## 2024-04-28 NOTE — ED Notes (Signed)
 Patient is more alert aware he is in the hospital much more corporative
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
## 2024-04-28 NOTE — ED Notes (Signed)
 Poked patient twice - first time unsuccessfully. Collected labs and first set of blood cultures. Communicated with Paramedic Victoria about this fact. KIT

## 2024-04-28 NOTE — ED Notes (Signed)
Provider is in the room.

## 2024-04-28 NOTE — ED Notes (Signed)
 Bear hugger applied.patient is more cooperative

## 2024-04-28 NOTE — Progress Notes (Signed)
 Admitted to 3W Room 3W31. Placed on cardiac monitor and vitals obtained.     04/28/24 1646 04/28/24 1700 04/28/24 1705  Vitals  Temp 97.7 F (36.5 C)  --   --   Temp Source Oral  --   --   BP (!) 144/77  --   --   MAP (mmHg) 96  --   --   BP Location Right Arm  --   --   BP Method Automatic  --   --   Patient Position (if appropriate) Lying  --   --   Pulse Rate 71  --   --   Pulse Rate Source Monitor  --   --   ECG Heart Rate  --  67 67  Resp 18 18 17   MEWS COLOR  MEWS Score Color Green Green Green  Oxygen Therapy  SpO2 100 %  --   --   O2 Device Room Air  --   --   ECG Monitoring  Telemetry Box Number  --   --  3W [id number]  Tele Box Verification Completed by Second Verifier  --   --  Completed ([place] NT)
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
## 2024-04-28 NOTE — Assessment & Plan Note (Addendum)
 stable, continue statins

## 2024-04-28 NOTE — Sepsis Progress Note (Signed)
 eLink is following this Code Sepsis.
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
## 2024-04-28 NOTE — Assessment & Plan Note (Signed)
-   With mild hyperglycemia -Checking CBG every 4 hours, SSI coverage -Home medication of Lantus  of 42 units, cut down to 20 units nightly -Rechecking A1c

## 2024-04-28 NOTE — Assessment & Plan Note (Signed)
 In the setting of advanced kidney disease serum potassium 5.2, 5.2 - One dose of Lokelma  was given in ED, rechecking potassium level - Anticipating administering another dose of Lokelma   - Monitor electrolytes closely

## 2024-04-28 NOTE — Assessment & Plan Note (Signed)
 Ruling out sepsis - Meets SIRS criteria -Presented with hypothermia, altered mental status -patient - Chest x-ray revealing low lung volume, no acute infiltrate - Pending UA -Ordering procalcitonin level, lactic acid -Blood cultures -Per sepsis protocol IV fluids, broad-spectrum antibiotics Limiting IV fluid resuscitation due to CKD, Broad IV antibiotics of vancomycin /Flagyl /cefepime  to be narrowed down in next 24 hours if no source of infection is identified

## 2024-04-28 NOTE — ED Notes (Signed)
 PT was alert to verbal stimuli when personnel walked in. He is alert to person, place, time, and event. PT stated that he has not been feeling well for a few days.   IV Flagyl  and Cefepime  were administered as prescribed. Unable to get a second line on left arm due to fistula. Insulin  and Heparin  were administered SQ. PT denied any pain. PT is resting comfortably in bed.

## 2024-04-28 NOTE — ED Notes (Signed)
 Report given to [facility] on 3W31.   Heparin  administered in right arm.
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
## 2024-04-29 DIAGNOSIS — F03918 Unspecified dementia, unspecified severity, with other behavioral disturbance: Secondary | ICD-10-CM

## 2024-04-29 DIAGNOSIS — F22 Delusional disorders: Secondary | ICD-10-CM

## 2024-04-29 LAB — CBC WITH DIFFERENTIAL/PLATELET
Abs Immature Granulocytes: 0.01 K/uL (ref 0.00–0.07)
Basophils Absolute: 0 K/uL (ref 0.0–0.1)
Basophils Relative: 0 %
Eosinophils Absolute: 0 K/uL (ref 0.0–0.5)
Eosinophils Relative: 1 %
HCT: 28.2 % — ABNORMAL LOW (ref 39.0–52.0)
Hemoglobin: 9 g/dL — ABNORMAL LOW (ref 13.0–17.0)
Immature Granulocytes: 0 %
Lymphocytes Relative: 17 %
Lymphs Abs: 0.9 K/uL (ref 0.7–4.0)
MCH: 31.7 pg (ref 26.0–34.0)
MCHC: 31.9 g/dL (ref 30.0–36.0)
MCV: 99.3 fL (ref 80.0–100.0)
Monocytes Absolute: 0.3 K/uL (ref 0.1–1.0)
Monocytes Relative: 6 %
Neutro Abs: 3.8 K/uL (ref 1.7–7.7)
Neutrophils Relative %: 76 %
Platelets: 115 K/uL — ABNORMAL LOW (ref 150–400)
RBC: 2.84 MIL/uL — ABNORMAL LOW (ref 4.22–5.81)
RDW: 14.1 % (ref 11.5–15.5)
WBC: 5 K/uL (ref 4.0–10.5)
nRBC: 0 % (ref 0.0–0.2)

## 2024-04-29 LAB — APTT: aPTT: 41 s — ABNORMAL HIGH (ref 24–36)

## 2024-04-29 LAB — HEPATITIS B SURFACE ANTIGEN: Hepatitis B Surface Ag: NONREACTIVE

## 2024-04-29 LAB — COMPREHENSIVE METABOLIC PANEL WITH GFR
ALT: 11 U/L (ref 0–44)
AST: 19 U/L (ref 15–41)
Albumin: 2.8 g/dL — ABNORMAL LOW (ref 3.5–5.0)
Alkaline Phosphatase: 47 U/L (ref 38–126)
Anion gap: 8 (ref 5–15)
BUN: 66 mg/dL — ABNORMAL HIGH (ref 8–23)
CO2: 22 mmol/L (ref 22–32)
Calcium: 9 mg/dL (ref 8.9–10.3)
Chloride: 112 mmol/L — ABNORMAL HIGH (ref 98–111)
Creatinine, Ser: 6.46 mg/dL — ABNORMAL HIGH (ref 0.61–1.24)
GFR, Estimated: 8 mL/min — ABNORMAL LOW (ref >60)
Glucose, Bld: 125 mg/dL — ABNORMAL HIGH (ref 70–99)
Potassium: 4.5 mmol/L (ref 3.5–5.1)
Sodium: 142 mmol/L (ref 135–145)
Total Bilirubin: 0.5 mg/dL (ref 0.0–1.2)
Total Protein: 5.2 g/dL — ABNORMAL LOW (ref 6.5–8.1)

## 2024-04-29 LAB — GLUCOSE, CAPILLARY
Glucose-Capillary: 112 mg/dL — ABNORMAL HIGH (ref 70–99)
Glucose-Capillary: 166 mg/dL — ABNORMAL HIGH (ref 70–99)
Glucose-Capillary: 55 mg/dL — ABNORMAL LOW (ref 70–99)
Glucose-Capillary: 65 mg/dL — ABNORMAL LOW (ref 70–99)
Glucose-Capillary: 68 mg/dL — ABNORMAL LOW (ref 70–99)
Glucose-Capillary: 69 mg/dL — ABNORMAL LOW (ref 70–99)
Glucose-Capillary: 71 mg/dL (ref 70–99)
Glucose-Capillary: 78 mg/dL (ref 70–99)
Glucose-Capillary: 86 mg/dL (ref 70–99)

## 2024-04-29 LAB — PHOSPHORUS: Phosphorus: 3.9 mg/dL (ref 2.5–4.6)

## 2024-04-29 MED ORDER — DEXTROSE 50 % IV SOLN
12.5000 g | Freq: Once | INTRAVENOUS | Status: AC
  Administered 2024-04-29: 12.5 g via INTRAVENOUS
  Filled 2024-04-29: qty 50

## 2024-04-29 MED ORDER — LIDOCAINE-PRILOCAINE 2.5-2.5 % EX CREA: 1.0000 | TOPICAL_CREAM | CUTANEOUS | Status: DC | PRN

## 2024-04-29 MED ORDER — HALOPERIDOL LACTATE 5 MG/ML IJ SOLN
2.0000 mg | Freq: Four times a day (QID) | INTRAMUSCULAR | Status: DC | PRN
  Administered 2024-04-29 – 2024-05-01 (×2): 2 mg via INTRAVENOUS
  Filled 2024-04-29 (×2): qty 1

## 2024-04-29 MED ORDER — CHLORHEXIDINE GLUCONATE CLOTH 2 % EX PADS
6.0000 | MEDICATED_PAD | Freq: Every day | CUTANEOUS | Status: DC
  Administered 2024-04-30 – 2024-05-07 (×8): 6 via TOPICAL

## 2024-04-29 MED ORDER — HALOPERIDOL LACTATE 5 MG/ML IJ SOLN
5.0000 mg | Freq: Once | INTRAMUSCULAR | Status: AC
  Administered 2024-04-29: 5 mg via INTRAVENOUS
  Filled 2024-04-29: qty 1

## 2024-04-29 MED ORDER — LIDOCAINE HCL (PF) 1 % IJ SOLN: 5.0000 mL | INTRAMUSCULAR | Status: DC | PRN

## 2024-04-29 MED ORDER — ANTICOAGULANT SODIUM CITRATE 4% (200MG/5ML) IV SOLN: 5.0000 mL | Status: DC | PRN

## 2024-04-29 MED ORDER — ALTEPLASE 2 MG IJ SOLR: 2.0000 mg | Freq: Once | INTRAMUSCULAR | Status: DC | PRN

## 2024-04-29 MED ORDER — HEPARIN SODIUM (PORCINE) 1000 UNIT/ML DIALYSIS: 1000.0000 [IU] | INTRAMUSCULAR | Status: DC | PRN

## 2024-04-29 MED ORDER — PENTAFLUOROPROP-TETRAFLUOROETH EX AERO: 1.0000 | INHALATION_SPRAY | CUTANEOUS | Status: DC | PRN

## 2024-04-29 NOTE — Progress Notes (Signed)
 OT Cancellation Note  Patient Details Name: Marwan Lipe MRN: [id number] DOB: 09/29/1945   Cancelled Treatment:    Reason Eval/Treat Not Completed: Other (comment) (Per notes, pt recently received haldol  and is now in restraints. Will continue efforts as able.)   Kadon Andrus M. Burma, OTR/L [facility] [phone] Secure Chat Preferred  Darryle Dennie 04/29/2024, 7:46 AM

## 2024-04-29 NOTE — Progress Notes (Signed)
   04/29/24 0535  Restraint Order  Length of Order Daily  Assessment  Less Restrictive Interventions Attempted Yes  Health history reviewed prior to applying restraint Yes  Justification  Clinical Justification Pulling lines;Pulling tubes;Removal of equipment  Plan to Progress Out Reality orientation;Continue safety plan;Other progression out plan (Comment);Interdisciplinary collaboration;Reevaluate safety plan  Education  Discontinuation Criteria No longer interfering with care;Follows instructions;Responding to safe limit settings;Alternative intervention effective  Discontinuation Criteria Explained Yes  Family Notification Family not available at this time  Restraint Every 2 Hour Monitoring  Airway Clear with Spontaneous Respirations Yes  Circulation / Skin Integrity No signs of injury/restraints applied correctly  Emotional / Mental Status Agitated/restless;Confused;Delusional;Hallucination;Verbally abusive  Range of Motion Performed  Food and Fluids Patient declined  Elimination Patient declined  Patient's rights, dignity, safety maintained Yes  Can Restraints be Less Restrictive or Discontinued? No  Medical Device Prophylactic Dressing Under Restraints  Restraints - Date Prophylactic Dressing Applied (if applicable) 04/29/24  Skin Assessed Under All Medical Device Prophylactic Dressings (if applicable) Done - Skin Intact  Non-violent Restraints  Soft Restraint Right Wrist Start  Soft Restraint Left Wrist Start  Safety Interventions  Less Restrictive Interventions Active listening;Bed alarm;Decrease stimulation (calming techniques);Frequent verbal contacts;Limit setting (adjust lighting in room, reduce environmental noise);Observation;Medicated;Pain/Anxiety management;Place patient near nursing station;Provide reassurance;Re-evaluate equipment (appropriately applied);Reorient patient;Repositioning   Wendi Dash, RN
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
## 2024-04-29 NOTE — Progress Notes (Signed)
 PT Cancellation Note  Patient Details Name: Kelly Dunn MRN: [id number] DOB: 03-Feb-1946   Cancelled Treatment:    Reason Eval/Treat Not Completed: Patient at procedure or test/unavailable (Pt off the floor at dialysis. Will follow up tomorrow.)   Araiyah Cumpton 04/29/2024, 2:40 PM
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
## 2024-04-29 NOTE — Progress Notes (Signed)
 PT Cancellation Note  Patient Details Name: Kelly Dunn MRN: [id number] DOB: 09/02/1945   Cancelled Treatment:    Reason Eval/Treat Not Completed: Patient not medically ready  Patient received haldol  this morning. Will wait and attempt evaluation in p.m. as schedule permits.    Kelly Dunn, PT Acute Rehabilitation Services  Office [phone]  Kelly Dunn 04/29/2024, 7:45 AM
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
## 2024-04-29 NOTE — Progress Notes (Signed)
 Pt is alert and fully oriented x 4, afebrile, stable hemodynamically, NSR on the monitor, normal respiratory effort, no acute distress noted. He is able to rest and seep well with no major complaints. He has unsteady gait, high fall risk, requires moderate assist with walker to [facility], fair-poor tolerated per documented below.   Family members brought food from outside. Education about renal diet with fluid  restriction provided. Pt has good oral intake. Checking CBG q 4 hrs with insulin  very sensitive sliding scale.    Pt has bowel and urine incontinent. Urine condom cath provided. He had one loose stool tonight.    Pt's plan of care is reviewed. We will continue to monitor.    04/28/24 2156  Mobility (See group info for [facility] equipment and weight capacities recommendations)  HOB Elevated/Bed Position HOB 30  Activity Ambulated with assistance;Pivoted/transferred to/from [facility]  Range of Motion/Exercises Active Assistive;All extremities  Level of Assistance Moderate assist, patient does 50-74%  Assistive Device BSC;Front wheel walker  Distance Ambulated (ft) 6 ft  Activity Response Tolerated fair-poor  Transport method Ambulatory   Wendi Dash, RN
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
## 2024-04-30 DIAGNOSIS — E876 Hypokalemia: Secondary | ICD-10-CM

## 2024-04-30 DIAGNOSIS — R4182 Altered mental status, unspecified: Secondary | ICD-10-CM

## 2024-04-30 LAB — COMPREHENSIVE METABOLIC PANEL WITH GFR
ALT: 13 U/L (ref 0–44)
AST: 23 U/L (ref 15–41)
Albumin: 3.1 g/dL — ABNORMAL LOW (ref 3.5–5.0)
Alkaline Phosphatase: 50 U/L (ref 38–126)
Anion gap: 8 (ref 5–15)
BUN: 42 mg/dL — ABNORMAL HIGH (ref 8–23)
CO2: 27 mmol/L (ref 22–32)
Calcium: 8.7 mg/dL — ABNORMAL LOW (ref 8.9–10.3)
Chloride: 106 mmol/L (ref 98–111)
Creatinine, Ser: 5.03 mg/dL — ABNORMAL HIGH (ref 0.61–1.24)
GFR, Estimated: 11 mL/min — ABNORMAL LOW (ref >60)
Glucose, Bld: 79 mg/dL (ref 70–99)
Potassium: 4 mmol/L (ref 3.5–5.1)
Sodium: 141 mmol/L (ref 135–145)
Total Bilirubin: 0.9 mg/dL (ref 0.0–1.2)
Total Protein: 6 g/dL — ABNORMAL LOW (ref 6.5–8.1)

## 2024-04-30 LAB — RETICULOCYTES
Immature Retic Fract: 26.6 % — ABNORMAL HIGH (ref 2.3–15.9)
RBC.: 2.87 MIL/uL — ABNORMAL LOW (ref 4.22–5.81)
Retic Count, Absolute: 37 K/uL (ref 19.0–186.0)
Retic Ct Pct: 1.3 % (ref 0.4–3.1)

## 2024-04-30 LAB — GLUCOSE, CAPILLARY
Glucose-Capillary: 77 mg/dL (ref 70–99)
Glucose-Capillary: 79 mg/dL (ref 70–99)
Glucose-Capillary: 76 mg/dL (ref 70–99)
Glucose-Capillary: 129 mg/dL — ABNORMAL HIGH (ref 70–99)
Glucose-Capillary: 142 mg/dL — ABNORMAL HIGH (ref 70–99)
Glucose-Capillary: 144 mg/dL — ABNORMAL HIGH (ref 70–99)
Glucose-Capillary: 107 mg/dL — ABNORMAL HIGH (ref 70–99)

## 2024-04-30 LAB — FOLATE: Folate: 5.1 ng/mL — ABNORMAL LOW (ref >5.9)

## 2024-04-30 LAB — FERRITIN: Ferritin: 293 ng/mL (ref 24–336)

## 2024-04-30 LAB — IRON AND TIBC
Iron: 61 ug/dL (ref 45–182)
Saturation Ratios: 38 % (ref 17.9–39.5)
TIBC: 161 ug/dL — ABNORMAL LOW (ref 250–450)
UIBC: 100 ug/dL

## 2024-04-30 LAB — VITAMIN B12: Vitamin B-12: 234 pg/mL (ref 180–914)

## 2024-04-30 MED ORDER — CHLORHEXIDINE GLUCONATE CLOTH 2 % EX PADS
6.0000 | MEDICATED_PAD | Freq: Every day | CUTANEOUS | Status: DC
  Administered 2024-05-01 – 2024-05-11 (×10): 6 via TOPICAL

## 2024-04-30 MED ORDER — FERROUS SULFATE 325 (65 FE) MG PO TABS
325.0000 mg | ORAL_TABLET | Freq: Every day | ORAL | Status: DC
  Administered 2024-04-30 – 2024-05-11 (×11): 325 mg via ORAL
  Filled 2024-04-30 (×11): qty 1

## 2024-04-30 MED ORDER — AMLODIPINE BESYLATE 5 MG PO TABS
10.0000 mg | ORAL_TABLET | Freq: Every evening | ORAL | Status: AC
  Administered 2024-04-30 – 2024-05-10 (×11): 10 mg via ORAL
  Filled 2024-04-30 (×5): qty 2
  Filled 2024-04-30 (×3): qty 1
  Filled 2024-04-30 (×3): qty 2

## 2024-04-30 MED ORDER — AMLODIPINE BESYLATE 5 MG PO TABS: 10.0000 mg | ORAL_TABLET | Freq: Every evening | ORAL | Status: DC

## 2024-04-30 NOTE — Progress Notes (Signed)
   Palliative Medicine Inpatient Follow Up Note HPI: 78 y.o. Dunn  with past medical history of HTN, DM2, HLD, CKD 5, with left AV fistula placement in April 2025 not on HD yet--- presented with a progressive agitation, confusion.  He was admitted on 04/28/2024 with altered mental status, hyperkalemia with advanced kidney disease, SIRS, rule out sepsis, CKD 5, and others.    Palliative medicine was consulted for GOC conversations.  Today's Discussion 04/30/2024  *Please note that this is a verbal dictation therefore any spelling or grammatical errors are due to the Dragon Medical One system interpretation.  I reviewed the chart notes including nursing notes, progress notes from Sharlot Becker - Psychiatry, Lanning Saba - Nephrology, Vinson Labella - Occupational therapy, Fredia Skeeter - Hpspitalist. I also reviewed vital signs, nursing flowsheets, medication administrations record, labs BMP Na 141, K 4.0, Chl 106, CO2 27, Glu 79, BUN 42, Cr 5.03, Ca 8.7, Anion Gap 8, GFR 11 and imaging CXR from 12/5 no acute processes.    Per chart review cognition has improved, patient note fearful people are trying to harm him.   Oral Intake %:  Not documented I/O:  Not accurate Bowel Movements:  Last today 12/7 Mobility: Able to mobilize with min assistance and walker  I met with Micheal this afternoon. He shares that he feels well. He denies pain, shortness of breath, or nausea. He does understand that he has started dialysis. Created space and opportunity for patient to explore thoughts feelings and fears regarding current medical situation.  Plan at this time will be to continue current care.   Questions and concerns addressed/Palliative Support Provided.   Objective Assessment: Vital Signs Vitals:   04/30/24 0806 04/30/24 1114  BP: (!) 165/63 (!) 135/59  Pulse: 71 68  Resp: 20 Kelly  Temp: 98.7 F (37.1 C) 98.5 F (36.9 C)  SpO2: 98% 99%    Intake/Output Summary (Last 24 hours) at 04/30/2024  1334 Last data filed at 04/30/2024 9086 Gross per 24 hour  Intake 3 ml  Output 0 ml  Net 3 ml   Last Weight  Most recent update: 04/29/2024  5:23 PM    Weight  99.7 kg (219 lb 12.8 oz)            Gen:  Elderly AA M chronically ill appearing HEENT: moist mucous membranes CV: Regular rate and rhythm  PULM:  On RA, breathing is even and nonlabored ABD: soft/nontender  EXT: Pedal edema  Neuro: Alert and oriented x2  SUMMARY OF RECOMMENDATIONS   DNR/DNI --> Continue full scope of care otherwise inclusive of iHD  Had been started on HD as of 12/6  Continue to allow time for outcomes  Implement strict delirium precautions  Have spoken about Ava with Dr. Arlice who shares he will reach out to us  if additional palliative support is needed in the oncoming days ______________________________________________________________________________________ Rosaline Becton [facility] Palliative Medicine Team Team Cell Phone: [phone] Please utilize secure chat with additional questions, if there is no response within 30 minutes please call the above phone number  Billing based on MDM: Moderate   Palliative Medicine Team providers are available by phone from 7am to 7pm daily and can be reached through the team cell phone.  Should this patient require assistance outside of these hours, please call the patient's attending physician.

## 2024-04-30 NOTE — Progress Notes (Signed)
 Patient able to tolerate being out of restraints during this shift.
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
## 2024-04-30 NOTE — Evaluation (Signed)
 Occupational Therapy Evaluation Patient Details Name: Kelly Dunn MRN: [id number] DOB: 1945/10/03 Today's Date: 04/30/2024   History of Present Illness   78 y/o male admitted 12/5 with sepsis CT (negative). PMHx: DM2, HTN, HLD, CKDV, L fistula in April 2025 not on HD yet.     Clinical Impressions Pt seen for OT eval this AM. Pleasant and participatory, AOX3 (disoriented to time). PTA, pt endorses living alone and mod I with ADLs and using cane PRN for ambulation. Has a daughter who is local. Pt presents today with generalized weakness, balance, and cognitive deficits. Functionally, he was mod A to stand and min A for ambulation via RW ~20'. Rather unsteady, fatigues quickly. He did tolerate simple grooming at the sink with min A for sequencing cues, although incontinent of urine due to mal-positioned condom cath. RN aware and in room upon departure.  Pt is currently functioning below baseline and would benefit from ongoing acute OT services to progress towards safe discharge and to facilitate return to prior level of function. Current recommendation is post-acute rehab (< 3 hours/day).     If plan is discharge home, recommend the following:   A lot of help with walking and/or transfers;A lot of help with bathing/dressing/bathroom;Assistance with cooking/housework;Direct supervision/assist for medications management;Direct supervision/assist for financial management;Assist for transportation;Help with stairs or ramp for entrance;Supervision due to cognitive status     Functional Status Assessment   Patient has had a recent decline in their functional status and demonstrates the ability to make significant improvements in function in a reasonable and predictable amount of time.     Equipment Recommendations   Other (comment) (defer to next level of care)     Recommendations for Other Services   PT consult     Precautions/Restrictions   Precautions Precautions:  Fall Precaution/Restrictions Comments: delirium prevention precautions; LUE AV fistula Restrictions Weight Bearing Restrictions Per Provider Order: No     Mobility Bed Mobility Overal bed mobility: Needs Assistance Bed Mobility: Supine to Sit, Sit to Supine     Supine to sit: Contact guard, HOB elevated, Used rails Sit to supine: Min assist, HOB elevated, Used rails   General bed mobility comments: assist for LE mgmt sit>supine transition    Transfers Overall transfer level: Needs assistance Equipment used: Rolling walker (2 wheels) Transfers: Sit to/from Stand Sit to Stand: Mod assist           General transfer comment: Stood from bed with VC for hand placement, min A for ambulation in room with RW.      Balance Overall balance assessment: Needs assistance Sitting-balance support: Single extremity supported, Feet supported Sitting balance-Leahy Scale: Fair Sitting balance - Comments: seated EOB, intermittent CGA   Standing balance support: Bilateral upper extremity supported, During functional activity, Reliant on assistive device for balance Standing balance-Leahy Scale: Poor Standing balance comment: reliant on RW                           ADL either performed or assessed with clinical judgement   ADL Overall ADL's : Needs assistance/impaired Eating/Feeding: Set up;Bed level   Grooming: Minimal assistance;Cueing for sequencing;Standing;Oral care;Wash/dry face Grooming Details (indicate cue type and reason): sequencing cues, pt needing A to dry hands Upper Body Bathing: Minimal assistance;Bed level   Lower Body Bathing: Moderate assistance   Upper Body Dressing : Minimal assistance   Lower Body Dressing: Moderate assistance;Sitting/lateral leans       Toileting- Clothing Manipulation and Hygiene:  Maximal assistance;Sitting/lateral lean;Sit to/from stand Toileting - Clothing Manipulation Details (indicate cue type and reason): condom cath nearly  off, pt incontinent of urine while standing at the sink (did not warn OT that he was urinating)     Functional mobility during ADLs: Minimal assistance;Rolling walker (2 wheels)       Vision Baseline Vision/History:  (no eye glasses present at time of OT eval)       Perception         Praxis         Pertinent Vitals/Pain Pain Assessment Pain Assessment: No/denies pain     Extremity/Trunk Assessment Upper Extremity Assessment Upper Extremity Assessment: Generalized weakness   Lower Extremity Assessment Lower Extremity Assessment: Generalized weakness       Communication Communication Communication: Impaired Factors Affecting Communication: Reduced clarity of speech   Cognition Arousal: Alert Behavior During Therapy: WFL for tasks assessed/performed Cognition: Cognition impaired   Orientation impairments: Time (pt stated April 13, 2024, re-oriented appropriately) Awareness: Intellectual awareness impaired Memory impairment (select all impairments): Short-term memory, Working memory Attention impairment (select first level of impairment): Sustained attention Executive functioning impairment (select all impairments): Problem solving, Sequencing OT - Cognition Comments: pleasant and participatory                 Following commands: Impaired Following commands impaired: Follows one step commands with increased time, Follows multi-step commands inconsistently     Cueing  General Comments   Cueing Techniques: Verbal cues;Visual cues  VSS throughout   Exercises     Shoulder Instructions      Home Living Family/patient expects to be discharged to:: Private residence Living Arrangements: Alone   Type of Home: House Home Access: Stairs to enter [employer] of Steps: 2 Entrance Stairs-Rails:  (unilateral HR) Home Layout: One level     Bathroom Shower/Tub: Chief Strategy Officer: Handicapped height     Home Equipment:  Cane - single Librarian, Academic (2 wheels)          Prior Functioning/Environment Prior Level of Function : Independent/Modified Independent             Mobility Comments: SPC PRN ADLs Comments: indep    OT Problem List: Decreased strength;Decreased activity tolerance;Impaired balance (sitting and/or standing);Decreased cognition;Decreased safety awareness;Decreased knowledge of use of DME or AE   OT Treatment/Interventions: Self-care/ADL training;Therapeutic exercise;DME and/or AE instruction;Therapeutic activities;Patient/family education;Balance training      OT Goals(Current goals can be found in the care plan section)   Acute Rehab OT Goals Patient Stated Goal: go to rehab OT Goal Formulation: With patient Time For Goal Achievement: 05/14/24 Potential to Achieve Goals: Good   OT Frequency:  Min 2X/week    Co-evaluation              AM-PAC OT 6 Clicks Daily Activity     Outcome Measure Help from another person eating meals?: None Help from another person taking care of personal grooming?: A Little Help from another person toileting, which includes using toliet, bedpan, or urinal?: A Lot Help from another person bathing (including washing, rinsing, drying)?: A Lot Help from another person to put on and taking off regular upper body clothing?: A Little Help from another person to put on and taking off regular lower body clothing?: A Lot 6 Click Score: 16   End of Session Equipment Utilized During Treatment: Gait belt;Rolling walker (2 wheels) Nurse Communication: Mobility status;Other (comment) (pt status, condom cath coming off at end of session)  Activity Tolerance: Patient tolerated treatment well Patient left: in bed;with call bell/phone within reach;with bed alarm set;with nursing/sitter in room  OT Visit Diagnosis: Unsteadiness on feet (R26.81);Other abnormalities of gait and mobility (R26.89);Repeated falls (R29.6);Other symptoms and signs  involving cognitive function;History of falling (Z91.81)                Time: 9079-9051 OT Time Calculation (min): 28 min Charges:  OT General Charges $OT Visit: 1 Visit OT Evaluation $OT Eval Moderate Complexity: 1 Mod  Shakala Marlatt M. [place], OTR/L [facility] [phone] Secure Chat Preferred  Kelly Dunn 04/30/2024, 10:44 AM
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
## 2024-04-30 NOTE — Progress Notes (Signed)
 Patient taken out of restraints per Nephro MD for concerns about restricted extremity. Attending MD notified of situation. Patient educated on safety precautions. Patient verbalized understanding of teaching by primary RN.
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
## 2024-04-30 NOTE — Evaluation (Signed)
 Physical Therapy Evaluation Patient Details Name: Kelly Dunn MRN: [id number] DOB: 05-13-1946 Today's Date: 04/30/2024  History of Present Illness  78 y/o male admitted 12/5 with sepsis CT (negative). PMHx: DM2, HTN, HLD, CKDV, L fistula in April 2025 not on HD yet.  Clinical Impression  Pt admitted with above diagnosis. Lives at home alone; PTA, pt endorses living alone and mod I with ADLs and using cane PRN for ambulation. Has a daughter who is local. Pt presents today with generalized weakness, balance, and cognitive deficits. Functionally, he was mod A to stand and min A for ambulation via RW ~20'. Rather unsteady, fatigues quickly.    Pt currently with functional limitations due to the deficits listed below (see PT Problem List). Pt will benefit from skilled PT to increase their independence and safety with mobility to allow discharge to the venue listed below.           If plan is discharge home, recommend the following: A lot of help with walking and/or transfers;A lot of help with bathing/dressing/bathroom   Can travel by private vehicle   Yes    Equipment Recommendations Rolling walker (2 wheels);BSC/3in1  Recommendations for Other Services       Functional Status Assessment Patient has had a recent decline in their functional status and demonstrates the ability to make significant improvements in function in a reasonable and predictable amount of time.     Precautions / Restrictions Precautions Precautions: Fall Precaution/Restrictions Comments: delirium prevention precautions; LUE AV fistula Restrictions Weight Bearing Restrictions Per Provider Order: No      Mobility  Bed Mobility Overal bed mobility: Needs Assistance Bed Mobility: Supine to Sit, Sit to Supine     Supine to sit: Contact guard, HOB elevated, Used rails Sit to supine: Min assist, HOB elevated, Used rails   General bed mobility comments: assist for LE mgmt sit>supine transition     Transfers Overall transfer level: Needs assistance Equipment used: Rolling walker (2 wheels) Transfers: Sit to/from Stand Sit to Stand: Mod assist           General transfer comment: Stood from bed with VC for hand placement; slow rise to stand, with Mod assist to rise and steady     Ambulation/Gait Ambulation/Gait assistance: Min assist Gait Distance (Feet): 20 Feet Assistive device: Rolling walker (2 wheels) Gait Pattern/deviations: Step-through pattern, Decreased step length - right, Decreased step length - left       General Gait Details: Cues to self-monitor for activity tolerance; noting more need for physical assist with turns; benefits from bil UE support  Stairs            Wheelchair Mobility     Tilt Bed    Modified Rankin (Stroke Patients Only)       Balance Overall balance assessment: Needs assistance Sitting-balance support: Single extremity supported, Feet supported Sitting balance-Leahy Scale: Fair Sitting balance - Comments: seated EOB, intermittent CGA   Standing balance support: Bilateral upper extremity supported, During functional activity, Reliant on assistive device for balance Standing balance-Leahy Scale: Poor Standing balance comment: reliant on RW                             Pertinent Vitals/Pain      Home Living Family/patient expects to be discharged to:: Private residence Living Arrangements: Alone   Type of Home: House Home Access: Stairs to enter Entrance Stairs-Rails:  (unilateral HR) Entrance Stairs-Number of Steps: 2  Home Layout: One level Home Equipment: Cane - single Librarian, Academic (2 wheels)      Prior Function Prior Level of Function : Independent/Modified Independent             Mobility Comments: SPC PRN ADLs Comments: indep     Extremity/Trunk Assessment   Upper Extremity Assessment Upper Extremity Assessment: Generalized weakness    Lower Extremity Assessment Lower  Extremity Assessment: Generalized weakness       Communication   Communication Communication: Impaired Factors Affecting Communication: Reduced clarity of speech    Cognition Arousal: Alert Behavior During Therapy: WFL for tasks assessed/performed                             Following commands: Impaired Following commands impaired: Follows one step commands with increased time, Follows multi-step commands inconsistently     Cueing Cueing Techniques: Verbal cues, Visual cues     General Comments General comments (skin integrity, edema, etc.): NAD on RA    Exercises     Assessment/Plan    PT Assessment Patient needs continued PT services  PT Problem List Decreased strength;Decreased activity tolerance;Decreased balance;Decreased mobility;Decreased coordination;Decreased cognition;Decreased knowledge of use of DME;Decreased safety awareness;Decreased knowledge of precautions       PT Treatment Interventions DME instruction;Gait training;Stair training;Functional mobility training;Therapeutic activities;Therapeutic exercise;Balance training;Neuromuscular re-education;Cognitive remediation;Patient/family education;Wheelchair mobility training;Manual techniques    PT Goals (Current goals can be found in the Care Plan section)  Acute Rehab PT Goals Patient Stated Goal: Recover PT Goal Formulation: With patient Time For Goal Achievement: 05/14/24 Potential to Achieve Goals: Good    Frequency Min 2X/week     Co-evaluation               AM-PAC PT 6 Clicks Mobility  Outcome Measure Help needed turning from your back to your side while in a flat bed without using bedrails?: A Little Help needed moving from lying on your back to sitting on the side of a flat bed without using bedrails?: A Little Help needed moving to and from a bed to a chair (including a wheelchair)?: A Little Help needed standing up from a chair using your arms (e.g., wheelchair or  bedside chair)?: A Little Help needed to walk in hospital room?: A Lot Help needed climbing 3-5 steps with a railing? : A Lot 6 Click Score: 16    End of Session Equipment Utilized During Treatment: Gait belt Activity Tolerance: Patient tolerated treatment well Patient left: in bed;with call bell/phone within reach;with bed alarm set Nurse Communication: Mobility status PT Visit Diagnosis: Unsteadiness on feet (R26.81);Other abnormalities of gait and mobility (R26.89);Muscle weakness (generalized) (M62.81)    Time: 8354-8294 PT Time Calculation (min) (ACUTE ONLY): 20 min   Charges:   PT Evaluation $PT Eval Moderate Complexity: 1 Mod   PT General Charges $$ ACUTE PT VISIT: 1 Visit         Silvano Currier, PT  Acute Rehabilitation Services Office [phone] Secure Chat welcomed   Silvano VEAR Currier 04/30/2024, 6:04 PM

## 2024-05-01 ENCOUNTER — Encounter (HOSPITAL_COMMUNITY)
Admission: EM | Disposition: A | Discharge status: Skilled Nursing Facility | Payer: Self-pay | Source: Home / Self Care | Attending: Family Medicine

## 2024-05-01 HISTORY — PX: A/V SHUNT INTERVENTION: CATH118220

## 2024-05-01 HISTORY — PX: A/V FISTULAGRAM: CATH118298

## 2024-05-01 LAB — GLUCOSE, CAPILLARY
Glucose-Capillary: 104 mg/dL — ABNORMAL HIGH (ref 70–99)
Glucose-Capillary: 131 mg/dL — ABNORMAL HIGH (ref 70–99)
Glucose-Capillary: 139 mg/dL — ABNORMAL HIGH (ref 70–99)
Glucose-Capillary: 92 mg/dL (ref 70–99)
Glucose-Capillary: 98 mg/dL (ref 70–99)
Glucose-Capillary: 99 mg/dL (ref 70–99)

## 2024-05-01 LAB — RENAL FUNCTION PANEL
Albumin: 3 g/dL — ABNORMAL LOW (ref 3.5–5.0)
Anion gap: 12 (ref 5–15)
BUN: 53 mg/dL — ABNORMAL HIGH (ref 8–23)
CO2: 25 mmol/L (ref 22–32)
Calcium: 9 mg/dL (ref 8.9–10.3)
Chloride: 107 mmol/L (ref 98–111)
Creatinine, Ser: 5.85 mg/dL — ABNORMAL HIGH (ref 0.61–1.24)
GFR, Estimated: 9 mL/min — ABNORMAL LOW (ref >60)
Glucose, Bld: 89 mg/dL (ref 70–99)
Phosphorus: 3.9 mg/dL (ref 2.5–4.6)
Potassium: 4.2 mmol/L (ref 3.5–5.1)
Sodium: 144 mmol/L (ref 135–145)

## 2024-05-01 LAB — COMPREHENSIVE METABOLIC PANEL WITH GFR
ALT: 12 U/L (ref 0–44)
AST: 23 U/L (ref 15–41)
Albumin: 2.9 g/dL — ABNORMAL LOW (ref 3.5–5.0)
Alkaline Phosphatase: 52 U/L (ref 38–126)
Anion gap: 11 (ref 5–15)
BUN: 53 mg/dL — ABNORMAL HIGH (ref 8–23)
CO2: 24 mmol/L (ref 22–32)
Calcium: 8.8 mg/dL — ABNORMAL LOW (ref 8.9–10.3)
Chloride: 110 mmol/L (ref 98–111)
Creatinine, Ser: 5.8 mg/dL — ABNORMAL HIGH (ref 0.61–1.24)
GFR, Estimated: 9 mL/min — ABNORMAL LOW (ref >60)
Glucose, Bld: 95 mg/dL (ref 70–99)
Potassium: 4.1 mmol/L (ref 3.5–5.1)
Sodium: 145 mmol/L (ref 135–145)
Total Bilirubin: 0.9 mg/dL (ref 0.0–1.2)
Total Protein: 5.7 g/dL — ABNORMAL LOW (ref 6.5–8.1)

## 2024-05-01 LAB — MRSA NEXT GEN BY PCR, NASAL: MRSA by PCR Next Gen: NOT DETECTED

## 2024-05-01 LAB — CBC
HCT: 27.3 % — ABNORMAL LOW (ref 39.0–52.0)
Hemoglobin: 8.6 g/dL — ABNORMAL LOW (ref 13.0–17.0)
MCH: 31.4 pg (ref 26.0–34.0)
MCHC: 31.5 g/dL (ref 30.0–36.0)
MCV: 99.6 fL (ref 80.0–100.0)
Platelets: 107 K/uL — ABNORMAL LOW (ref 150–400)
RBC: 2.74 MIL/uL — ABNORMAL LOW (ref 4.22–5.81)
RDW: 14.6 % (ref 11.5–15.5)
WBC: 4.8 K/uL (ref 4.0–10.5)
nRBC: 0.6 % — ABNORMAL HIGH (ref 0.0–0.2)

## 2024-05-01 MED ORDER — LIDOCAINE HCL (PF) 1 % IJ SOLN
INTRAMUSCULAR | Status: AC
  Filled 2024-05-01: qty 30

## 2024-05-01 MED ORDER — PENTAFLUOROPROP-TETRAFLUOROETH EX AERO: 1.0000 | INHALATION_SPRAY | CUTANEOUS | Status: DC | PRN

## 2024-05-01 MED ORDER — IODIXANOL 320 MG/ML IV SOLN
INTRAVENOUS | Status: DC | PRN
  Administered 2024-05-01: 70 mL

## 2024-05-01 MED ORDER — ALTEPLASE 2 MG IJ SOLR: 2.0000 mg | Freq: Once | INTRAMUSCULAR | Status: DC | PRN

## 2024-05-01 MED ORDER — LIDOCAINE HCL (PF) 1 % IJ SOLN: 5.0000 mL | INTRAMUSCULAR | Status: DC | PRN

## 2024-05-01 MED ORDER — LIDOCAINE HCL (PF) 1 % IJ SOLN
INTRAMUSCULAR | Status: DC | PRN
  Administered 2024-05-01: 10 mL

## 2024-05-01 MED ORDER — HEPARIN SODIUM (PORCINE) 1000 UNIT/ML IJ SOLN
INTRAMUSCULAR | Status: AC
  Filled 2024-05-01: qty 10

## 2024-05-01 MED ORDER — HEPARIN (PORCINE) IN NACL 1000-0.9 UT/500ML-% IV SOLN
INTRAVENOUS | Status: DC | PRN
  Administered 2024-05-01: 500 mL

## 2024-05-01 MED ORDER — HEPARIN SODIUM (PORCINE) 1000 UNIT/ML IJ SOLN
INTRAMUSCULAR | Status: DC | PRN
  Administered 2024-05-01: 5000 [IU] via INTRAVENOUS

## 2024-05-01 MED ORDER — HEPARIN SODIUM (PORCINE) 1000 UNIT/ML DIALYSIS: 1000.0000 [IU] | INTRAMUSCULAR | Status: DC | PRN

## 2024-05-01 MED ORDER — ANTICOAGULANT SODIUM CITRATE 4% (200MG/5ML) IV SOLN: 5.0000 mL | Status: DC | PRN

## 2024-05-01 MED ORDER — LIDOCAINE-PRILOCAINE 2.5-2.5 % EX CREA: 1.0000 | TOPICAL_CREAM | CUTANEOUS | Status: DC | PRN
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
## 2024-05-01 NOTE — Plan of Care (Signed)
   Problem: Education: Goal: Ability to describe self-care measures that may prevent or decrease complications (Diabetes Survival Skills Education) will improve Outcome: Progressing Goal: Individualized Educational Video(s) Outcome: Progressing   Problem: Coping: Goal: Ability to adjust to condition or change in health will improve Outcome: Progressing

## 2024-05-01 NOTE — Progress Notes (Signed)
 Patient not able to run with dialysis fistula:  top venous infiltrated and became swollen.  Ice applied, swelling came down, but not going to run today.  Dr. Gearline informed.  Camellia Brasil, LPN [facility]

## 2024-05-01 NOTE — Progress Notes (Signed)
 PROGRESS NOTE  Kelly Dunn  DOB: May 15, 1946  PCP: Regino Slater, MD FMW:[id number]  DOA: 04/28/2024  LOS: 3 days  Hospital Day: 4  Subjective: Patient was seen and examined this morning. Afebrile, blood pressure in 150s and 160s. Labs from this morning with hemoglobin 8.6.  Blood glucose 90s Dialysis could not be done this morning because of infiltration.  Vascular surgery has been consulted for hysterogram.  Brief narrative: Kelly Dunn is a 78 y.o. male with PMH significant for DM2, HTN, HLD, CKD 5, gout, neuropathy.  Follows up with nephrology as an outpatient, had left AV fistula created in April 2025 not on HD yet 12/5, patient presented from home with a progressive agitation, confusion.  Per family, he had similar episode earlier this year.  He has been progressively getting confused-with delusion, hallucination that someone was in a house.  911 was called, subsequently he was found in the neighbors driveway, confused, agitated, on the ground and was brought to the ED  Upon arrival to ED, he was hypothermic with bradycardia but oxygen saturation was fine and blood pressure was within normal range.   BMP showed hyperkalemia and elevated creatinine at his baseline.   CT head unremarkable  chest x-ray and UA unremarkable.   Admitted to [facility]  Multiple services including nephrology, palliative care and psychiatry were consulted   Assessment and plan: Progressive CKD New ESRD Has LUE AVF.   Started on dialysis on 12/6 due to uremic symptoms. Dialysis could not be done this morning because of infiltration.  Vascular surgery has been consulted for hysterogram. Outpatient arrangement in process.  Acute metabolic encephalopathy Likely secondary to uremia in the setting of advanced CKD.  Patient was also on gabapentin which has now been stopped Expect further improvement in mental status with subsequent dialysis Seen by psychiatry as well. Continue Zoloft  25 mg daily On  Haldol  2 mg IV every 6 as needed for agitation EKG 12/7 QTc 420 ms. May need soft restraints.  Clear instructed nursing staff to avoid putting tight restraints on the fistula arm. The last 24 hours, mental status has been relatively better.  Not restless or agitated.  Little somnolent this morning,  Likely from uremia  SIRS Was hypothermic and bradycardic on admission Likely because he was out on the cold.  Was found on neighbor's driveway: Confused. No evidence of infection. Blood culture sent on admission did not show any growth. Empiric antibiotics have been discontinued. Recent Labs  Lab 04/28/24 0439 04/28/24 0914 04/28/24 1200 04/28/24 2348 05/01/24 0755  WBC 4.8  --   --  5.0 4.8  LATICACIDVEN  --  1.1 1.3  --   --   PROCALCITON 0.40  --   --   --   --    Hyperkalemia Potassium level improved with dialysis Recent Labs  Lab 04/28/24 0439 04/28/24 0445 04/28/24 1200 04/29/24 0223 04/30/24 0534 05/01/24 0352 05/01/24 0755  K 5.2*   < > 4.3 4.5 4.0 4.1 4.2  MG 2.2  --   --   --   --   --   --   PHOS 4.5  --   --  3.9  --   --  3.9   < > = values in this interval not displayed.   Essential hypertension Blood pressure controlled, continue current management. PTA meds- Coreg  25 mg twice daily amlodipine  10 mg daily, Lasix  twice daily Currently continued on Coreg  and amlodipine   Type 2 diabetes mellitus Hypoglycemia A1c 4.3 on 04/28/24 PTA  meds- Lantus  Was hypoglycemic on 12/6.  Lantus  was discontinued Currently only on SSI/Accu-Cheks. Insulin  requirement may increase since he has been started on dialysis.  Continue to monitor Recent Labs  Lab 04/30/24 1941 04/30/24 2318 05/01/24 0401 05/01/24 0909 05/01/24 1157  GLUCAP 144* 107* 98 104* 99   HLD Lipitor 40 mg daily  Chronic macrocytic anemia Hemoglobin at baseline between 9 and 10.  Continue to monitor. Obtain anemia panel Recent Labs    12/31/23 0931 12/31/23 0937 01/28/24 1239 01/28/24 1241  02/25/24 1228 02/25/24 1236 04/07/24 1027 04/07/24 1031 04/24/24 1029 04/28/24 0439 04/28/24 0439 04/28/24 0445 04/28/24 2348 04/30/24 1019 05/01/24 0755  HGB  --    < >  --    < >  --    < >  --    < > 9.0* 10.6*  --  12.2* 9.0*  --  8.6*  MCV  --   --   --   --   --   --   --   --   --  100.9*   < >  --  99.3  --  99.6  VITAMINB12  --   --   --   --   --   --   --   --   --   --   --   --   --  234  --   FOLATE  --   --   --   --   --   --   --   --   --   --   --   --   --  5.1*  --   FERRITIN 128  --  262  --  282  --  237  --   --   --   --   --   --  293  --   TIBC 204*  --  211*  --  197*  --  195*  --   --   --   --   --   --  161*  --   IRON 70  --  72  --  85  --  65  --   --   --   --   --   --  61  --   RETICCTPCT  --   --   --   --   --   --   --   --   --   --   --   --   --  1.3  --    < > = values in this interval not displayed.   H/o gout Continue allopurinol .  Diabetic neuropathy Gabapentin on hold because of altered mentation.  Nephrology recommends against restarting it  Overweight Body mass index is 29.57 kg/m. Patient has been advised to make an attempt to improve diet and exercise patterns to aid in weight loss.  Nutrition Status:         Mobility: Pending PT eval  PT Orders: Active   PT Follow up Rec: Skilled Nursing-Short Term Rehab (<3 Hours/Day)04/30/2024 1800    Goals of care   Code Status: Limited: Do not attempt resuscitation (DNR) -DNR-LIMITED -Do Not Intubate/DNI      DVT prophylaxis:  heparin  injection 5,000 Units Start: 04/28/24 0815 SCDs Start: 04/28/24 0759   Antimicrobials: None currently Fluid: None Consultants: Nephrology Family Communication: None at bedside  Status: Inpatient Level of care:  Progressive   Patient is from: Home Needs  to continue in-hospital care: newly started on dialysis.  Vascular surgery plan for fistulogram this afternoon. Anticipated d/c to: Pending PT eval       Diet:  Diet Order              Diet NPO time specified Except for: Sips with Meds  Diet effective now                   Scheduled Meds:  allopurinol   100 mg Oral Daily   amLODipine   10 mg Oral QPM   aspirin  EC  81 mg Oral Daily   atorvastatin   40 mg Oral Daily   calcitRIOL   0.25 mcg Oral Q M,W,F   carvedilol   25 mg Oral BID WC   Chlorhexidine  Gluconate Cloth  6 each Topical Q0600   Chlorhexidine  Gluconate Cloth  6 each Topical Q0600   ferrous sulfate   325 mg Oral Daily   heparin   5,000 Units Subcutaneous Q8H   insulin  aspart  0-6 Units Subcutaneous Q4H   multivitamin with minerals  1 tablet Oral Daily   sertraline   25 mg Oral Daily   sodium chloride  flush  3 mL Intravenous Q12H    PRN meds: acetaminophen  **OR** acetaminophen , bisacodyl , haloperidol  lactate, hydrALAZINE , HYDROmorphone  (DILAUDID ) injection, ipratropium, ondansetron  **OR** ondansetron  (ZOFRAN ) IV, oxyCODONE , senna-docusate   Infusions:    Antimicrobials: Anti-infectives (From admission, onward)    Start     Dose/Rate Route Frequency Ordered Stop   04/29/24 0900  ceFEPIme  (MAXIPIME ) 2 g in sodium chloride  0.9 % 100 mL IVPB  Status:  Discontinued        2 g 200 mL/hr over 30 Minutes Intravenous Every 24 hours 04/28/24 0851 04/29/24 0847   04/28/24 0851  vancomycin  variable dose per unstable renal function (pharmacist dosing)  Status:  Discontinued         Does not apply See admin instructions 04/28/24 0851 04/29/24 0847   04/28/24 0830  ceFEPIme  (MAXIPIME ) 2 g in sodium chloride  0.9 % 100 mL IVPB        2 g 200 mL/hr over 30 Minutes Intravenous  Once 04/28/24 0820 04/28/24 0955   04/28/24 0830  metroNIDAZOLE  (FLAGYL ) IVPB 500 mg  Status:  Discontinued        500 mg 100 mL/hr over 60 Minutes Intravenous Every 12 hours 04/28/24 0820 04/29/24 0847   04/28/24 0830  vancomycin  (VANCOREADY) IVPB 2000 mg/400 mL        2,000 mg 200 mL/hr over 120 Minutes Intravenous  Once 04/28/24 0820 04/28/24 1208       Objective: Vitals:    05/01/24 1149 05/01/24 1152  BP: (!) 156/59 (!) 156/59  Pulse: 73   Resp: 20 19  Temp: 98.3 F (36.8 C) 98.3 F (36.8 C)  SpO2: 98% 98%    Intake/Output Summary (Last 24 hours) at 05/01/2024 1203 Last data filed at 05/01/2024 1100 Gross per 24 hour  Intake 480 ml  Output 450 ml  Net 30 ml   Filed Weights   04/29/24 1706 05/01/24 0500 05/01/24 0758  Weight: 99.7 kg 99.6 kg 98.9 kg   Weight change: -0.2 kg Body mass index is 29.57 kg/m.   Physical Exam: General exam: Pleasant, elderly African-American male.  Not in distress Skin: No rashes, lesions or ulcers. HEENT: Atraumatic, normocephalic, no obvious bleeding Lungs: Clear to auscultation bilaterally,  CVS: S1, S2, no murmur,   GI/Abd: Soft, nontender, nondistended, bowel sound present,   CNS: Somnolent this morning. Psychiatry: Sad affect Extremities: No pedal  edema, no calf tenderness,   Data Review: I have personally reviewed the laboratory data and studies available.  F/u labs ordered Unresulted Labs (From admission, onward)     Start     Ordered   05/01/24 1155  MRSA Next Gen by PCR, Nasal  Once,   R        05/01/24 1155   04/29/24 1126  Hepatitis B surface antibody,quantitative  (New Admission Hemo Labs (Hepatitis B))  Once,   R       Question:  Specimen collection method  Answer:  Lab=Lab collect   04/29/24 1127   04/29/24 0811  C Difficile Quick Screen w PCR reflex  (C Difficile quick screen w PCR reflex panel )  Once, for 24 hours,   TIMED       References:    CDiff Information Tool   04/29/24 0810   04/28/24 0759  Expectorated Sputum Assessment w Gram Stain, Rflx to Resp Cult  Once,   R       Comments: If productive cough    04/28/24 0800            Signed, Chapman Rota, MD [facility] 05/01/2024

## 2024-05-01 NOTE — Progress Notes (Signed)
 Patient bed exiting, refusing assistance from nurse, holding up urinal threatening to throw it at Nurse and NT

## 2024-05-01 NOTE — Plan of Care (Signed)
  Problem: Coping: Goal: Ability to adjust to condition or change in health will improve Outcome: Progressing   Problem: Skin Integrity: Goal: Risk for impaired skin integrity will decrease Outcome: Progressing   Problem: Safety: Goal: Ability to remain free from injury will improve Outcome: Progressing   Problem: Skin Integrity: Goal: Risk for impaired skin integrity will decrease Outcome: Progressing   Problem: Safety: Goal: Non-violent Restraint(s) Outcome: Progressing

## 2024-05-01 NOTE — Op Note (Signed)
    Patient name: Kelly Dunn MRN: [id number] DOB: Jun 25, 1945 Sex: male  05/01/2024 Pre-operative Diagnosis: End-stage renal disease, malfunction left arm AV fistula with infiltration Post-operative diagnosis:  Same Surgeon:  Penne BROCKS. Sheree, MD Procedure Performed: 1.  Percutaneous ultrasound-guided cannulation left arm AV fistula 2.  Left upper extremity fistulogram 3.  Plain balloon angioplasty mid upper arm AV fistula with 8 and 10 mm Mustang balloons 4.  Stent left cephalic arch with 10 x 40 and 10 x 60 mm epic stents postdilated with 10 mm balloon  Indications: 78 year old male with history of left upper arm AV fistula which is a superficial lysed cephalic vein now with pulsatility on exam and recent infiltration.  He is indicated for fistulogram with possible invention.  Findings: In the midsegment there was approximately 80% stenosis reduced to less than 20% stenosis with 8 and 10 mm balloon angioplasty.  The cephalic arch was subtotally occluded was primarily stented with initially 10 x 40 mm epic stent which migrated centrally and this was fixed and the lesion was stented with 10 x 60 mm epic and these were postdilated with 10 mm balloon.  At completion there was a much improved thrill throughout the fistula.  Fistula is okay for immediate use, if remains amenable to future endovascular intervention.   Procedure:  The patient was identified in the holding area and taken to room 8.  The patient was then placed supine on the table and prepped and draped in the usual sterile fashion.  A time out was called.  Ultrasound was used to evaluate the left arm AV fistula near the antecubitum.  The area was anesthetized 1% lidocaine  cannulated with a micropuncture needle followed by wire sheath.  Ultrasound images saved to the permanent record.  Left upper extremity fistulogram was performed including retrograde imaging with the fistula compressed.  We then placed initially Glidewire followed  by 7 French sheath and perform balloon angioplasty of the mid upper arm lesion to less than 20% residual stenosis with some improvement in flow by palpation.  There did appear to be mild amount of thrombus in the upper arm fistula and there for 5000 units of heparin  was then administered.  We then used a Glidewire and Kumpe catheter across the subtotally occluded cephalic vein and performed central venogram.  Initially a Rosen followed by a V18 wire was placed into the IVC under fluoroscopic guidance.  We then began with balloon venoplasty of the cephalic arch with 8 and 10 mm balloons.  There was some concern for rupture and therefore balloon was inflated for 4 minutes at low pressure.  Completion demonstrated no rupture however the lesion remained and required stenting.  I started with a 10 x 40 mm epic stent this unfortunately migrated centrally and I extended back with a 10 x 60 mm epic stent postdilated with 10 mm balloon.  Completion demonstrated resolution of the lesion with no residual stenosis and brisk flow by palpation.  Satisfied with this the wire and sheath were removed and the cannulation site was suture-ligated.  He tolerated the procedure without any complication.  Contrast: 70cc  Andersen Mckiver C. Sheree, MD [facility] Office: [phone] Pager: [phone]

## 2024-05-01 NOTE — TOC Initial Note (Signed)
 Transition of Care [facility]) - Initial/Assessment Note    Patient Details  Name: Kelly Dunn MRN: [id number] Date of Birth: [birth year]  Transition of Care [facility]) CM/SW Contact:    Kelly CHRISTELLA Goodie, LCSW Phone Number: 05/01/2024, 1:42 PM  Clinical Narrative:     Patient from home alone, current recommendation for SNF. CSW met with patient, he is in agreement, and requested that CSW contact his daughter, Kelly Dunn, as well. CSW completed referral and faxed out, awaiting bed offers. CSW spoke with daughter, Kelly Dunn, to discuss SNF recommendation. Kelly Dunn in agreement, says she is worried about the patient going back home alone. CSW to follow.        Expected Discharge Plan: Skilled Nursing Facility Barriers to Discharge: Continued Medical Work up, English As A Second Language Teacher, Facility will not accept until restraint criteria met, Requiring sitter/restraints, Waiting for outpatient dialysis   Patient Goals and CMS Choice Patient states their goals for this hospitalization and ongoing recovery are:: to get better CMS Medicare.gov Compare Post Acute Care list provided to:: Patient Choice offered to / list presented to : Patient, Adult Children Kelly Dunn ownership interest in [facility].provided to:: Patient    Expected Discharge Plan and Services     Post Acute Care Choice: Skilled Nursing Facility Living arrangements for the past 2 months: Single Family Home                                      Prior Living Arrangements/Services Living arrangements for the past 2 months: Single Family Home Lives with:: Self Patient language and need for interpreter reviewed:: No Do you feel safe going back to the place where you live?: Yes      Need for Family Participation in Patient Care: No (Comment) Care giver support system in place?: No (comment)   Criminal Activity/Legal Involvement Pertinent to Current Situation/Hospitalization: No - Comment as needed  Activities of Daily Living    ADL Screening (condition at time of admission) Independently performs ADLs?: No Does the patient have a NEW difficulty with bathing/dressing/toileting/self-feeding that is expected to last >3 days?: Yes (Initiates electronic notice to provider for possible OT consult) Does the patient have a NEW difficulty with getting in/out of bed, walking, or climbing stairs that is expected to last >3 days?: Yes (Initiates electronic notice to provider for possible PT consult) Does the patient have a NEW difficulty with communication that is expected to last >3 days?: No Is the patient deaf or have difficulty hearing?: No Does the patient have difficulty seeing, even when wearing glasses/contacts?: No Does the patient have difficulty concentrating, remembering, or making decisions?: Yes  Permission Sought/Granted Permission sought to share information with : Facility Medical Sales Representative, Family Supports Permission granted to share information with : Yes, Verbal Permission Granted  Share Information with NAME: Kelly Dunn  Permission granted to share info w AGENCY: SNF  Permission granted to share info w Relationship: Daughter     Emotional Assessment Appearance:: Appears stated age Attitude/Demeanor/Rapport: Engaged Affect (typically observed): Appropriate Orientation: : Oriented to Self, Oriented to Place, Oriented to  Time, Oriented to Situation Alcohol / Substance Use: Not Applicable Psych Involvement: Yes (comment)  Admission diagnosis:  Hyperkalemia [E87.5] Altered mental status [R41.82] Combative behavior [R46.89] Altered mental status, unspecified altered mental status type [R41.82] Hypothermia associated with environmental change [T68.XXXA] Patient Active Problem List   Diagnosis Date Noted   Paranoia (HCC) 04/29/2024   Altered mental status  04/28/2024   SIRS (systemic inflammatory response syndrome) (HCC) 04/28/2024   Hyperkalemia 04/28/2024   DM (diabetes mellitus), type 2 (HCC)  04/28/2024   Anemia due to stage 5 chronic kidney disease treated with erythropoietin (HCC) 02/02/2024   Chronic gouty arthritis 08/17/2020   Diabetic renal disease (HCC) 08/17/2020   Essential hypertension 08/17/2020   Hyperparathyroidism due to renal insufficiency 08/17/2020   Morbid obesity (HCC) 08/17/2020   Pure hypercholesterolemia 08/17/2020   PCP:  Regino Slater, MD Pharmacy:   [facility] [address] ([address]), [place] - [address] [place] ([place]) [name] [address] Phone: [phone] Fax: [phone]  [facility] [address] [name] [address] Phone: [phone] Fax: [phone]     Social Drivers of Health (SDOH) Social History: SDOH Screenings   Tobacco Use: Medium Risk (04/28/2024)   SDOH Interventions:     Readmission Risk Interventions     No data to display

## 2024-05-01 NOTE — [place] FL2 (Addendum)
   MEDICAID FL2 LEVEL OF CARE FORM     IDENTIFICATION  Patient Name: Kelly Dunn Birthdate: 29-Apr-1946 Sex: male Admission Date (Current Location): 04/28/2024  [facility] and [place] Number:  Producer, Television/film/video and Address:  [facility], [address], [place], [name] [address]      Provider Number: [id number]  Attending Physician Name and Address:  Arlice Reichert, MD  Relative Name and Phone Number:       Current Level of Care: Hospital Recommended Level of Care: Skilled Nursing Facility Prior Approval Number:    Date Approved/Denied:   PASRR Number: [id number]  Discharge Plan: SNF    Current Diagnoses: Patient Active Problem List   Diagnosis Date Noted   Paranoia (HCC) 04/29/2024   Altered mental status 04/28/2024   SIRS (systemic inflammatory response syndrome) (HCC) 04/28/2024   Hyperkalemia 04/28/2024   DM (diabetes mellitus), type 2 (HCC) 04/28/2024   Anemia due to stage 5 chronic kidney disease treated with erythropoietin (HCC) 02/02/2024   Chronic gouty arthritis 08/17/2020   Diabetic renal disease (HCC) 08/17/2020   Essential hypertension 08/17/2020   Hyperparathyroidism due to renal insufficiency 08/17/2020   Morbid obesity (HCC) 08/17/2020   Pure hypercholesterolemia 08/17/2020    Orientation RESPIRATION BLADDER Height & Weight     Self, Time, Situation, Place  Normal Incontinent Weight: 218 lb 0.6 oz (98.9 kg) Height:  6' (182.9 cm)  BEHAVIORAL SYMPTOMS/MOOD NEUROLOGICAL BOWEL NUTRITION STATUS      Incontinent Diet (see DC summary)  AMBULATORY STATUS COMMUNICATION OF NEEDS Skin   Extensive Assist Verbally Normal                       Personal Care Assistance Level of Assistance  Bathing, Feeding, Dressing Bathing Assistance: Maximum assistance Feeding assistance: Independent Dressing Assistance: Maximum assistance     Functional Limitations Info  Sight Sight Info: Impaired (glasses)         SPECIAL CARE FACTORS FREQUENCY  PT (By licensed PT), OT (By licensed OT)     PT Frequency: 5x/wk OT Frequency: 5x/wk            Contractures Contractures Info: Not present    Additional Factors Info  Code Status, Allergies, Psychotropic, Insulin  Sliding Scale Code Status Info: DNR Allergies Info: Nsaids, Sertraline  Psychotropic Info: Zoloft  25mg  daily Insulin  Sliding Scale Info: see DC summary       Current Medications (05/01/2024):  This is the current hospital active medication list Current Facility-Administered Medications  Medication Dose Route Frequency Provider Last Rate Last Admin   acetaminophen  (TYLENOL ) tablet 650 mg  650 mg Oral Q6H PRN Shahmehdi, Seyed A, MD       Or   acetaminophen  (TYLENOL ) suppository 650 mg  650 mg Rectal Q6H PRN Shahmehdi, Seyed A, MD       allopurinol  (ZYLOPRIM ) tablet 100 mg  100 mg Oral Daily Shahmehdi, Seyed A, MD   100 mg at 05/01/24 1120   amLODipine  (NORVASC ) tablet 10 mg  10 mg Oral QPM Jerrye Katheryn BROCKS, MD   10 mg at 04/30/24 2121   aspirin  EC tablet 81 mg  81 mg Oral Daily Shahmehdi, Seyed A, MD   81 mg at 05/01/24 1120   atorvastatin  (LIPITOR) tablet 40 mg  40 mg Oral Daily Shahmehdi, Seyed A, MD   40 mg at 05/01/24 1120   bisacodyl  (DULCOLAX) EC tablet 5 mg  5 mg Oral Daily PRN Willette Adriana LABOR, MD  calcitRIOL  (ROCALTROL ) capsule 0.25 mcg  0.25 mcg Oral Q M,W,F Foster, Lori C, MD   0.25 mcg at 05/01/24 1133   carvedilol  (COREG ) tablet 25 mg  25 mg Oral BID WC Shahmehdi, Seyed A, MD   25 mg at 05/01/24 1119   Chlorhexidine  Gluconate Cloth 2 % PADS 6 each  6 each Topical Q0600 Jerrye Katheryn BROCKS, MD   6 each at 05/01/24 9364   Chlorhexidine  Gluconate Cloth 2 % PADS 6 each  6 each Topical Q0600 Jerrye Katheryn BROCKS, MD   6 each at 05/01/24 9364   ferrous sulfate  tablet 325 mg  325 mg Oral Daily Dahal, Chapman, MD   325 mg at 05/01/24 1120   haloperidol  lactate (HALDOL ) injection 2 mg  2 mg Intravenous Q6H PRN Pahwani, Ravi, MD   2 mg at  05/01/24 0249   heparin  injection 5,000 Units  5,000 Units Subcutaneous Q8H Shahmehdi, Adriana A, MD   5,000 Units at 05/01/24 9364   hydrALAZINE  (APRESOLINE ) injection 10 mg  10 mg Intravenous Q4H PRN Shahmehdi, Seyed A, MD       HYDROmorphone  (DILAUDID ) injection 0.5-1 mg  0.5-1 mg Intravenous Q2H PRN Shahmehdi, Adriana LABOR, MD       insulin  aspart (novoLOG ) injection 0-6 Units  0-6 Units Subcutaneous Q4H Shahmehdi, Seyed A, MD   1 Units at 04/29/24 0012   ipratropium (ATROVENT ) nebulizer solution 0.5 mg  0.5 mg Nebulization Q6H PRN Shahmehdi, Seyed A, MD       multivitamin with minerals tablet 1 tablet  1 tablet Oral Daily Shahmehdi, Seyed A, MD   1 tablet at 04/30/24 0912   ondansetron  (ZOFRAN ) tablet 4 mg  4 mg Oral Q6H PRN Shahmehdi, Adriana LABOR, MD       Or   ondansetron  (ZOFRAN ) injection 4 mg  4 mg Intravenous Q6H PRN Shahmehdi, Seyed A, MD       oxyCODONE  (Oxy IR/ROXICODONE ) immediate release tablet 5 mg  5 mg Oral Q4H PRN Shahmehdi, Seyed A, MD       senna-docusate (Senokot-S) tablet 1 tablet  1 tablet Oral QHS PRN Shahmehdi, Seyed A, MD       sertraline  (ZOLOFT ) tablet 25 mg  25 mg Oral Daily Shahmehdi, Seyed A, MD   25 mg at 05/01/24 1120   sodium chloride  flush (NS) 0.9 % injection 3 mL  3 mL Intravenous Q12H Shahmehdi, Seyed A, MD   3 mL at 05/01/24 1022     Discharge Medications: Please see discharge summary for a list of discharge medications.  Relevant Imaging Results:  Relevant Lab Results:   Additional Information SS#: [id number]; needs new HD, no clinic yet  Almarie CHRISTELLA Goodie, LCSW

## 2024-05-01 NOTE — Consult Note (Addendum)
 Hospital Consult    Reason for Consult: Malfunctioning fistula Requesting Physician:  Gearline MD MRN #:  [id number]  History of Present Illness: Kelly Dunn is a 78 y.o. male with a PMH of HTN and CKD with recent progression to ESRD in need of dialysis.  He was admitted to [facility] 3 days ago with altered mental status likely due to uremia.  His first dialysis session was on 12/6 through a left brachiocephalic AV fistula, which was successful.  Dialysis was attempted again this morning through his left brachiocephalic fistula, however this was infiltrated.  We were consulted for fistulogram.  The patient was resting comfortably on my exam.  He denies any pain in his left upper extremity.  He says that the ice pack on his left arm is helping with his swelling.  Past Medical History:  Diagnosis Date   Chronic kidney disease (CKD), stage III (moderate) (HCC)    Diabetes mellitus    type II   Dyspnea    with activity   Gout    Hypercholesteremia    Hypertension    Neuropathy associated with endocrine disorder     Past Surgical History:  Procedure Laterality Date   AV FISTULA PLACEMENT Left 07/15/2020   Procedure: Brachiocephalic Arteriovenous Fistula;  Surgeon: Harvey Carlin BRAVO, MD;  Location: [facility] OR;  Service: Vascular;  Laterality: Left;  PERIPHERAL NERVE BLOCK   FISTULA SUPERFICIALIZATION Left 08/27/2020   Procedure: LEFT ARM ARTERIOVENOUS FISTULA SUPERFICIALIZATION;  Surgeon: Harvey Carlin BRAVO, MD;  Location: [facility] OR;  Service: Vascular;  Laterality: Left;    Allergies  Allergen Reactions   Nsaids Other (See Comments)    Contraindication due to CKD    Sertraline  Other (See Comments)    Hallucinations     Prior to Admission medications   Medication Sig Start Date End Date Taking? Authorizing Provider  amLODipine  (NORVASC ) 10 MG tablet Take 10 mg by mouth daily.   Yes [provider]  aspirin  81 MG tablet Take 81 mg by mouth daily.   Yes [provider]   atorvastatin  (LIPITOR) 40 MG tablet Take 40 mg by mouth daily.   Yes [provider]  calcitRIOL  (ROCALTROL ) 0.25 MCG capsule Take 0.25 mcg by mouth 3 (three) times a week. 03/29/24  Yes [provider]  carvedilol  (COREG ) 25 MG tablet Take 25 mg by mouth daily at 12 noon.   Yes [provider]  Cholecalciferol (VITAMIN D3) 5000 units CAPS Take 5,000 Units by mouth daily.   Yes [provider]  FEROSUL 325 (65 Fe) MG tablet Take 325 mg by mouth daily. 04/10/24  Yes [provider]  furosemide  (LASIX ) 40 MG tablet Take 40-80 mg by mouth See admin instructions. Take 80 mg in the morning and 40 mg in the evening   Yes [provider]  gabapentin (NEURONTIN) 100 MG capsule Take 100 mg by mouth 2 (two) times daily as needed (pain). 03/15/24  Yes [provider]  insulin  glargine (LANTUS ) 100 UNIT/ML injection Inject 9 Units into the skin at bedtime.   Yes [provider]  sodium bicarbonate 650 MG tablet Take 1,300 mg by mouth 3 (three) times daily. 04/10/24  Yes [provider]  allopurinol  (ZYLOPRIM ) 100 MG tablet Take 200 mg by mouth daily. Patient not taking: Reported on 04/28/2024 05/20/20   [provider]  colchicine  0.6 MG tablet Take 1 tablet (0.6 mg total) by mouth 2 (two) times daily. Patient not taking: Reported on 04/28/2024 01/25/12 01/24/13  Vincente Lynwood BIRCH, MD  sertraline  (ZOLOFT ) 25 MG tablet Take 25 mg by mouth daily. Patient not taking: Reported on 04/28/2024    [provider]    Social History   Socioeconomic History   Marital status: Married    Spouse name: Not on file   Number of children: Not on file   Years of education: Not on file   Highest education level: Not on file  Occupational History   Not on file  Tobacco Use   Smoking status: Former   Smokeless tobacco: Former    Quit date: 07/16/1972   Tobacco comments:    stopped age 3  Vaping Use   Vaping status: Never Used   Substance and Sexual Activity   Alcohol use: No   Drug use: No   Sexual activity: Not on file  Other Topics Concern   Not on file  Social History Narrative   Not on file   Social Drivers of Health   Financial Resource Strain: Not on file  Food Insecurity: Not on file  Transportation Needs: Not on file  Physical Activity: Not on file  Stress: Not on file  Social Connections: Not on file  Intimate Partner Violence: Not on file    Family History  Problem Relation Age of Onset   Diabetes Other    Hypertension Other     ROS: Otherwise negative unless mentioned in HPI  Physical Examination  Vitals:   05/01/24 0830 05/01/24 0909  BP:  (!) 158/57  Pulse: 71 71  Resp: 17   Temp:  98.4 F (36.9 C)  SpO2: 99% 99%   Body mass index is 29.57 kg/m.  General:  WDWN in NAD Gait: Not observed HENT: WNL, normocephalic Pulmonary: normal non-labored breathing Cardiac: Regular Abdomen:  soft, NT/ND, no masses Skin: without rashes Vascular Exam/Pulses: Palpable left radial pulse Extremities: Infiltrated left brachiocephalic fistula with mild surrounding swelling.  Left brachiocephalic fistula with palpable thrill, moderately pulsatile on exam Musculoskeletal: no muscle wasting or atrophy  Neurologic: A&O X 3;  No focal weakness or paresthesias are detected; speech is fluent/normal Psychiatric:  The pt has Normal affect. Lymph:  Unremarkable  CBC    Component Value Date/Time   WBC 4.8 05/01/2024 0755   RBC 2.74 (L) 05/01/2024 0755   HGB 8.6 (L) 05/01/2024 0755   HCT 27.3 (L) 05/01/2024 0755   PLT 107 (L) 05/01/2024 0755   MCV 99.6 05/01/2024 0755   MCH 31.4 05/01/2024 0755   MCHC 31.5 05/01/2024 0755   RDW 14.6 05/01/2024 0755   LYMPHSABS 0.9 04/28/2024 2348   MONOABS 0.3 04/28/2024 2348   EOSABS 0.0 04/28/2024 2348   BASOSABS 0.0 04/28/2024 2348    BMET    Component Value Date/Time   NA 144 05/01/2024 0755   K 4.2 05/01/2024 0755   CL 107 05/01/2024 0755    CO2 25 05/01/2024 0755   GLUCOSE 89 05/01/2024 0755   BUN 53 (H) 05/01/2024 0755   CREATININE 5.85 (H) 05/01/2024 0755   CALCIUM  9.0 05/01/2024 0755   CALCIUM  9.3 04/07/2024 1027   GFRNONAA 9 (L) 05/01/2024 0755    COAGS: No results found for: INR, PROTIME   ASSESSMENT/PLAN: This is a 78 y.o. male with malfunctioning fistula   - The patient was recently admitted to [facility] with recent progression of CKD to ESRD in need of dialysis.  He underwent a successful dialysis session via left brachiocephalic fistula on 12/6 - Dialysis was attempted again this morning through his  left brachiocephalic fistula.  This was unsuccessful due to fistula infiltration.  We were consulted for possible fistulogram -The patient denies any pain on my exam.  He currently has an ice pack on his left arm to help with swelling.  He has a palpable left radial pulse.  He does have mild swelling in his left upper arm surrounding his fistula.  His fistula has a good thrill, however it is moderately pulsatile - I have explained to the patient that he likely has underlying fistula stenosis that caused his infiltration event.  We can plan for left upper extremity fistulogram today to treat his stenosis.  He has been n.p.o. since midnight. I have explained to the patient he may need a TDC placed in the Cath Lab as well if fistula intervention is unsuccessful. He is agreeable to procedure.   - Will continue n.p.o. and place consent orders for left upper extremity fistulogram with Dr. Sheree in the Cath Lab today.   Ahmed Holster PA-C [facility] [phone]   I have independently interviewed and examined patient and agree with PA assessment and plan above. Plan fistulogram today, possible tdc.   Jaliyah Fotheringham C. Sheree, MD [facility] [place] Office: [phone] Pager: [phone]

## 2024-05-01 NOTE — Progress Notes (Signed)
 [facility] Progress Note   Assessment/ Plan:   # CKD stage V with progression to ESRD  - With uremic symptoms/AMS.  HD started on 12/6 via his existing LUE AVF - Plan for HD on 12/8-- infiltration - will c/s VVS for fgram, appreciate   # AMS - sundowning vs gaba toxicity vs uremic   # Hyperkalemia  - mild and improved with HD - continue renal diabetic diet      # HTN  - he didn't get amlodipine  on 12/6. Retimed this for evening dosing so that he doesn't end up missing on HD days   # Anemia of CKD  - he received retacrit  10,000 units once on 04/24/24 under the direction of our office at [facility]   - he is not yet due again for ESA - Will now transition to ESA at the dialysis unit   # Metabolic bone disease - on calcitriol  0.25 mcg MWF - we are starting HD so the calcitriol  would be given at the HD unit.  I would advise not listing this on the discharge med list so as not to confuse the patient and cause duplicate dosing - phos acceptable     Subjective:    Seen after HD- infiltrated today with 2 17 g needles.  In clinic- AVF seemed more pulsatile/ change in pitch.  Think we will have to get an fgram.     Objective:   BP (!) 160/60   Pulse 71   Temp 98.7 F (37.1 C) (Oral)   Resp 17   Ht 6' (1.829 m)   Wt 98.9 kg   SpO2 99%   BMI 29.57 kg/m   Physical Exam: Hzw:noizm, ill appearing CVS:RRR Resp:clear Jai:dnqu Ext:2+ LE edema, healed skin tears ACCESS: L AVF + T/B  Labs: BMET Recent Labs  Lab 04/28/24 0439 04/28/24 0445 04/28/24 1200 04/29/24 0223 04/30/24 0534 05/01/24 0352  NA 145 145 145 142 141 145  K 5.2* 5.2* 4.3 4.5 4.0 4.1  CL 105 109 115* 112* 106 110  CO2 21*  --  20* 22 27 24   GLUCOSE 277* 262* 84 125* 79 95  BUN 61* 58* 63* 66* 42* 53*  CREATININE 6.62* 6.60* 6.32* 6.46* 5.03* 5.80*  CALCIUM  10.0  --  8.9 9.0 8.7* 8.8*  PHOS 4.5  --   --  3.9  --   --    CBC Recent Labs  Lab 04/28/24 0439 04/28/24 0445 04/28/24 2348  05/01/24 0755  WBC 4.8  --  5.0 4.8  NEUTROABS 3.5  --  3.8  --   HGB 10.6* 12.2* 9.0* 8.6*  HCT 34.1* 36.0* 28.2* 27.3*  MCV 100.9*  --  99.3 99.6  PLT 113*  --  115* 107*      Medications:     allopurinol   100 mg Oral Daily   amLODipine   10 mg Oral QPM   aspirin  EC  81 mg Oral Daily   atorvastatin   40 mg Oral Daily   calcitRIOL   0.25 mcg Oral Q M,W,F   carvedilol   25 mg Oral BID WC   Chlorhexidine  Gluconate Cloth  6 each Topical Q0600   Chlorhexidine  Gluconate Cloth  6 each Topical Q0600   ferrous sulfate   325 mg Oral Daily   heparin   5,000 Units Subcutaneous Q8H   insulin  aspart  0-6 Units Subcutaneous Q4H   multivitamin with minerals  1 tablet Oral Daily   sertraline   25 mg Oral Daily   sodium chloride  flush  3 mL Intravenous Q12H     Almarie Bonine, MD 05/01/2024, 9:06 AM

## 2024-05-01 NOTE — Progress Notes (Signed)
 Requested to see pt for out-pt HD needs at d/c. Pt's chart reviewed. Noted therapy recs for snf. Reached out to RN CM to be advised of pt's need for out-pt HD at d/c and navigator can assist with HD clinic placement once d/c plan is confirmed. Will assist as needed.   Randine Mungo Dialysis Navigator [phone]

## 2024-05-02 ENCOUNTER — Encounter (HOSPITAL_COMMUNITY): Payer: Self-pay | Admitting: Vascular Surgery

## 2024-05-02 LAB — GLUCOSE, CAPILLARY
Glucose-Capillary: 118 mg/dL — ABNORMAL HIGH (ref 70–99)
Glucose-Capillary: 114 mg/dL — ABNORMAL HIGH (ref 70–99)
Glucose-Capillary: 98 mg/dL (ref 70–99)
Glucose-Capillary: 134 mg/dL — ABNORMAL HIGH (ref 70–99)
Glucose-Capillary: 109 mg/dL — ABNORMAL HIGH (ref 70–99)
Glucose-Capillary: 155 mg/dL — ABNORMAL HIGH (ref 70–99)
Glucose-Capillary: 128 mg/dL — ABNORMAL HIGH (ref 70–99)

## 2024-05-02 LAB — HEPATITIS B SURFACE ANTIBODY, QUANTITATIVE: Hep B S AB Quant (Post): <3.5 m[IU]/mL — ABNORMAL LOW

## 2024-05-02 LAB — CBC WITH DIFFERENTIAL/PLATELET
Abs Immature Granulocytes: 0.03 K/uL (ref 0.00–0.07)
Basophils Absolute: 0 K/uL (ref 0.0–0.1)
Basophils Relative: 0 %
Eosinophils Absolute: 0.1 K/uL (ref 0.0–0.5)
Eosinophils Relative: 3 %
HCT: 26.4 % — ABNORMAL LOW (ref 39.0–52.0)
Hemoglobin: 8.1 g/dL — ABNORMAL LOW (ref 13.0–17.0)
Immature Granulocytes: 1 %
Lymphocytes Relative: 27 %
Lymphs Abs: 1.3 K/uL (ref 0.7–4.0)
MCH: 31.3 pg (ref 26.0–34.0)
MCHC: 30.7 g/dL (ref 30.0–36.0)
MCV: 101.9 fL — ABNORMAL HIGH (ref 80.0–100.0)
Monocytes Absolute: 0.4 K/uL (ref 0.1–1.0)
Monocytes Relative: 9 %
Neutro Abs: 2.8 K/uL (ref 1.7–7.7)
Neutrophils Relative %: 60 %
Platelets: 100 K/uL — ABNORMAL LOW (ref 150–400)
RBC: 2.59 MIL/uL — ABNORMAL LOW (ref 4.22–5.81)
RDW: 15 % (ref 11.5–15.5)
WBC: 4.6 K/uL (ref 4.0–10.5)
nRBC: 0 % (ref 0.0–0.2)

## 2024-05-02 LAB — RENAL FUNCTION PANEL
Albumin: 3 g/dL — ABNORMAL LOW (ref 3.5–5.0)
Anion gap: 20 — ABNORMAL HIGH (ref 5–15)
BUN: 60 mg/dL — ABNORMAL HIGH (ref 8–23)
CO2: 16 mmol/L — ABNORMAL LOW (ref 22–32)
Calcium: 8.9 mg/dL (ref 8.9–10.3)
Chloride: 106 mmol/L (ref 98–111)
Creatinine, Ser: 6.31 mg/dL — ABNORMAL HIGH (ref 0.61–1.24)
GFR, Estimated: 9 mL/min — ABNORMAL LOW (ref >60)
Glucose, Bld: 103 mg/dL — ABNORMAL HIGH (ref 70–99)
Phosphorus: 3.8 mg/dL (ref 2.5–4.6)
Potassium: 4.2 mmol/L (ref 3.5–5.1)
Sodium: 142 mmol/L (ref 135–145)

## 2024-05-02 NOTE — Procedures (Signed)
 Patient seen and examined on Hemodialysis. The procedure was supervised and I have made appropriate changes. BP (!) 145/46   Pulse 63   Temp 98.1 F (36.7 C)   Resp 19   Ht 6' (1.829 m)   Wt 101.1 kg   SpO2 98%   BMI 30.23 kg/m   QB 200 mL/ min via AVF, UF goal 1L  Tolerating treatment without complaints at this time.  2 17 g needles- smooth cannulation.  Excellent.   Almarie Bonine MD [facility] [phone] 9:30 AM

## 2024-05-02 NOTE — TOC Progression Note (Signed)
 Transition of Care [facility]) - Progression Note    Patient Details  Name: Kelly Dunn MRN: [id number] Date of Birth: 06/30/1945  Transition of [facility]) CM/SW Contact  Almarie CHRISTELLA Goodie, [name] Phone Number: 05/02/2024, 1:43 PM  Clinical Narrative:   Patient with no bed offers at this time. [employer] considering, CSW to follow up on questions. CSW to follow.    Expected Discharge Plan: Skilled Nursing Facility Barriers to Discharge: Continued Medical Work up, English As A Second Language Teacher, Facility will not accept until restraint criteria met, Requiring sitter/restraints, Waiting for outpatient dialysis               Expected Discharge Plan and Services     Post Acute Care Choice: Skilled Nursing Facility Living arrangements for the past 2 months: Single Family Home                                       Social Drivers of Health (SDOH) Interventions SDOH Screenings   Tobacco Use: Medium Risk (04/28/2024)    Readmission Risk Interventions     No data to display

## 2024-05-02 NOTE — Progress Notes (Signed)
 PROGRESS NOTE  Kelly Dunn  DOB: 14-Mar-1946  PCP: Regino Slater, MD FMW:[id number]  DOA: 04/28/2024  LOS: 4 days  Hospital Day: 5  Subjective: Patient was seen and examined this morning at dialysis unit. Not in distress.  Alert, awake, oriented x 2. Afebrile, heart rate in 60s, blood pressure 140s to 150s, breathing on room air. Labs this morning with serum bicarb low at 16, creatinine 6.31, hemoglobin 8.1  Brief narrative: Kelly Dunn is a 78 y.o. male with PMH significant for DM2, HTN, HLD, CKD 5, gout, neuropathy.  Follows up with nephrology as an outpatient, had left AV fistula created in April 2025 not on HD yet 12/5, patient presented from home with a progressive agitation, confusion.  Per family, he had similar episode earlier this year.  He has been progressively getting confused-with delusion, hallucination that someone was in a house.  911 was called, subsequently he was found in the neighbors driveway, confused, agitated, on the ground and was brought to the ED  Upon arrival to ED, he was hypothermic with bradycardia but oxygen saturation was fine and blood pressure was within normal range.   BMP showed hyperkalemia and elevated creatinine at his baseline.   CT head unremarkable  chest x-ray and UA unremarkable.   Admitted to [facility]  Multiple services including nephrology, palliative care and psychiatry were consulted   Assessment and plan: Progressive CKD New ESRD Has LUE AVF.   Started on dialysis on 12/6 due to uremic symptoms. 12/8, patient required balloon angioplasty and stenting of the cephalic arc of the fistula by vascular surgery. 12/9, able to cannulate the fistula and complete dialysis Outpatient dialysis arrangement in process.  Acute metabolic encephalopathy Likely secondary to uremia in the setting of advanced CKD.  Patient was also on gabapentin which has now been stopped Expect further improvement in mental status with subsequent  dialysis Seen by psychiatry as well. Continue Zoloft  25 mg daily On Haldol  2 mg IV every 6 as needed for agitation EKG 12/7 QTc 420 ms. May need soft restraints.  Clearly instructed nursing staff to avoid putting tight restraints on the fistula arm. Mental status significantly better.  Slow to respond but able to have meaningful conversation. Psychiatry signed off  SIRS Was hypothermic and bradycardic on admission Likely because he was out on the cold.  Was found on neighbor's driveway confused. No evidence of infection. Blood culture sent on admission did not show any growth. Empiric antibiotics have been discontinued. Recent Labs  Lab 04/28/24 0439 04/28/24 0914 04/28/24 1200 04/28/24 2348 05/01/24 0755 05/02/24 0755  WBC 4.8  --   --  5.0 4.8 4.6  LATICACIDVEN  --  1.1 1.3  --   --   --   PROCALCITON 0.40  --   --   --   --   --    Hyperkalemia Potassium level improved with dialysis Recent Labs  Lab 04/28/24 0439 04/28/24 0445 04/29/24 0223 04/30/24 0534 05/01/24 0352 05/01/24 0755 05/02/24 0755  K 5.2*   < > 4.5 4.0 4.1 4.2 4.2  MG 2.2  --   --   --   --   --   --   PHOS 4.5  --  3.9  --   --  3.9 3.8   < > = values in this interval not displayed.   Essential hypertension Blood pressure controlled, continue current management. PTA meds- Coreg  25 mg twice daily amlodipine  10 mg daily, Lasix  twice daily Currently continued on Coreg  and  amlodipine   Type 2 diabetes mellitus Hypoglycemia A1c 4.3 on 04/28/24 PTA meds- Lantus  Was hypoglycemic on 12/6.  Lantus  was discontinued Currently only on SSI/Accu-Cheks. Insulin  requirement may increase since he has been started on dialysis.  Continue to monitor Recent Labs  Lab 05/01/24 2030 05/01/24 2336 05/02/24 0321 05/02/24 1007 05/02/24 1159  GLUCAP 139* 131* 118* 114* 98   HLD Lipitor 40 mg daily  Chronic macrocytic anemia Hemoglobin at baseline between 9 and 10.  Continue to monitor. Anemia panel showed  ferritin level of 293. Recent Labs    12/31/23 0931 12/31/23 0937 01/28/24 1239 01/28/24 1241 02/25/24 1228 02/25/24 1236 04/07/24 1027 04/07/24 1031 04/28/24 0439 04/28/24 0445 04/28/24 2348 04/30/24 1019 05/01/24 0755 05/02/24 0755  HGB  --    < >  --    < >  --    < >  --    < > 10.6* 12.2* 9.0*  --  8.6* 8.1*  MCV  --   --   --   --   --   --   --    < > 100.9*  --  99.3  --  99.6 101.9*  VITAMINB12  --   --   --   --   --   --   --   --   --   --   --  234  --   --   FOLATE  --   --   --   --   --   --   --   --   --   --   --  5.1*  --   --   FERRITIN 128  --  262  --  282  --  237  --   --   --   --  293  --   --   TIBC 204*  --  211*  --  197*  --  195*  --   --   --   --  161*  --   --   IRON 70  --  72  --  85  --  65  --   --   --   --  61  --   --   RETICCTPCT  --   --   --   --   --   --   --   --   --   --   --  1.3  --   --    < > = values in this interval not displayed.   H/o gout Continue allopurinol .  Diabetic neuropathy Gabapentin on hold because of altered mentation.  Nephrology recommends against restarting it.  Overweight Body mass index is 29.96 kg/m. Patient has been advised to make an attempt to improve diet and exercise patterns to aid in weight loss.  Nutrition Status:         Mobility: Pending PT eval  PT Orders: Active   PT Follow up Rec: Skilled Nursing-Short Term Rehab (<3 Hours/Day)04/30/2024 1800    Goals of care   Code Status: Limited: Do not attempt resuscitation (DNR) -DNR-LIMITED -Do Not Intubate/DNI      DVT prophylaxis:  heparin  injection 5,000 Units Start: 04/28/24 0815 SCDs Start: 04/28/24 0759   Antimicrobials: None currently Fluid: None Consultants: Nephrology Family Communication: None at bedside  Status: Inpatient Level of care:  Progressive Cardiac   Patient is from: Home Needs to continue in-hospital care: newly started on dialysis.  Outpatient chair  arrangement in process    Diet:  Diet Order              Diet renal with fluid restriction Fluid restriction: 1200 mL Fluid; Room service appropriate? Yes; Fluid consistency: Thin  Diet effective now                   Scheduled Meds:  allopurinol   100 mg Oral Daily   amLODipine   10 mg Oral QPM   aspirin  EC  81 mg Oral Daily   atorvastatin   40 mg Oral Daily   calcitRIOL   0.25 mcg Oral Q M,W,F   carvedilol   25 mg Oral BID WC   Chlorhexidine  Gluconate Cloth  6 each Topical Q0600   Chlorhexidine  Gluconate Cloth  6 each Topical Q0600   ferrous sulfate   325 mg Oral Daily   heparin   5,000 Units Subcutaneous Q8H   insulin  aspart  0-6 Units Subcutaneous Q4H   multivitamin with minerals  1 tablet Oral Daily   sertraline   25 mg Oral Daily   sodium chloride  flush  3 mL Intravenous Q12H    PRN meds: acetaminophen  **OR** acetaminophen , bisacodyl , haloperidol  lactate, hydrALAZINE , HYDROmorphone  (DILAUDID ) injection, ipratropium, ondansetron  **OR** ondansetron  (ZOFRAN ) IV, oxyCODONE , senna-docusate   Infusions:    Antimicrobials: Anti-infectives (From admission, onward)    Start     Dose/Rate Route Frequency Ordered Stop   04/29/24 0900  ceFEPIme  (MAXIPIME ) 2 g in sodium chloride  0.9 % 100 mL IVPB  Status:  Discontinued        2 g 200 mL/hr over 30 Minutes Intravenous Every 24 hours 04/28/24 0851 04/29/24 0847   04/28/24 0851  vancomycin  variable dose per unstable renal function (pharmacist dosing)  Status:  Discontinued         Does not apply See admin instructions 04/28/24 0851 04/29/24 0847   04/28/24 0830  ceFEPIme  (MAXIPIME ) 2 g in sodium chloride  0.9 % 100 mL IVPB        2 g 200 mL/hr over 30 Minutes Intravenous  Once 04/28/24 0820 04/28/24 0955   04/28/24 0830  metroNIDAZOLE  (FLAGYL ) IVPB 500 mg  Status:  Discontinued        500 mg 100 mL/hr over 60 Minutes Intravenous Every 12 hours 04/28/24 0820 04/29/24 0847   04/28/24 0830  vancomycin  (VANCOREADY) IVPB 2000 mg/400 mL        2,000 mg 200 mL/hr over 120 Minutes  Intravenous  Once 04/28/24 0820 04/28/24 1208       Objective: Vitals:   05/02/24 1128 05/02/24 1201  BP: (!) 149/51 (!) 143/50  Pulse: 65 67  Resp: (!) 21 20  Temp: 97.9 F (36.6 C) 98.2 F (36.8 C)  SpO2: 97% 100%    Intake/Output Summary (Last 24 hours) at 05/02/2024 1316 Last data filed at 05/02/2024 1128 Gross per 24 hour  Intake 360 ml  Output 1000 ml  Net -640 ml   Filed Weights   05/01/24 0758 05/02/24 0733 05/02/24 1128  Weight: 98.9 kg 101.1 kg 100.2 kg   Weight change: -0.7 kg Body mass index is 29.96 kg/m.   Physical Exam: General exam: Pleasant, elderly African-American male.  Not in distress Skin: No rashes, lesions or ulcers. HEENT: Atraumatic, normocephalic, no obvious bleeding Lungs: Clear to auscultation bilaterally,  CVS: S1, S2, no murmur,   GI/Abd: Soft, nontender, nondistended, bowel sound present,   CNS: Alert, awake, oriented to place and person Psychiatry: Mood appropriate Extremities: No pedal edema, no calf tenderness,   Data Review: I  have personally reviewed the laboratory data and studies available.  F/u labs ordered Unresulted Labs (From admission, onward)     Start     Ordered   04/29/24 0811  C Difficile Quick Screen w PCR reflex  (C Difficile quick screen w PCR reflex panel )  Once, for 24 hours,   TIMED       References:    CDiff Information Tool   04/29/24 0810   04/28/24 0759  Expectorated Sputum Assessment w Gram Stain, Rflx to Resp Cult  Once,   R       Comments: If productive cough    04/28/24 0800            Signed, Chapman Rota, MD [facility] 05/02/2024

## 2024-05-02 NOTE — Progress Notes (Incomplete)
 1 Liter ultrafiltration, post hemodialysis condition stable and report was given to the RN assuming care.

## 2024-05-02 NOTE — Progress Notes (Signed)
.  [facility] Progress Note   Assessment/ Plan:   # CKD stage V with progression to ESRD  - With uremic symptoms/AMS.  HD started on 12/6 via his existing LUE AVF - Plan for HD on 12/8-- infiltration - VVS with fgram yesterday, greatly appreciate - HD today   # AMS - sundowning vs gaba toxicity vs uremic - getting better   # Hyperkalemia  - mild and improved with HD - continue renal diabetic diet      # HTN  - he didn't get amlodipine  on 12/6. Retimed this for evening dosing so that he doesn't end up missing on HD days   # Anemia of CKD  - last retacrit  10,000 units 04/24/24 - follow and dose when appropriate   # Metabolic bone disease - calcitriol  with dialysis  Subjective:    Seen in HD- getting 2 17 g    Objective:   BP (!) 145/46   Pulse 63   Temp 98.1 F (36.7 C)   Resp 19   Ht 6' (1.829 m)   Wt 101.1 kg   SpO2 98%   BMI 30.23 kg/m   Physical Exam: Hzw:noizm, ill appearing CVS:RRR Resp:clear Jai:dnqu Ext:2+ LE edema, healed skin tears ACCESS: L AVF + T/B  Labs: BMET Recent Labs  Lab 04/28/24 0439 04/28/24 0445 04/28/24 1200 04/29/24 0223 04/30/24 0534 05/01/24 0352 05/01/24 0755 05/02/24 0755  NA 145 145 145 142 141 145 144 142  K 5.2* 5.2* 4.3 4.5 4.0 4.1 4.2 4.2  CL 105 109 115* 112* 106 110 107 106  CO2 21*  --  20* 22 27 24 25  16*  GLUCOSE 277* 262* 84 125* 79 95 89 103*  BUN 61* 58* 63* 66* 42* 53* 53* 60*  CREATININE 6.62* 6.60* 6.32* 6.46* 5.03* 5.80* 5.85* 6.31*  CALCIUM  10.0  --  8.9 9.0 8.7* 8.8* 9.0 8.9  PHOS 4.5  --   --  3.9  --   --  3.9 3.8   CBC Recent Labs  Lab 04/28/24 0439 04/28/24 0445 04/28/24 2348 05/01/24 0755 05/02/24 0755  WBC 4.8  --  5.0 4.8 4.6  NEUTROABS 3.5  --  3.8  --  2.8  HGB 10.6* 12.2* 9.0* 8.6* 8.1*  HCT 34.1* 36.0* 28.2* 27.3* 26.4*  MCV 100.9*  --  99.3 99.6 101.9*  PLT 113*  --  115* 107* 100*      Medications:     allopurinol   100 mg Oral Daily   amLODipine   10  mg Oral QPM   aspirin  EC  81 mg Oral Daily   atorvastatin   40 mg Oral Daily   calcitRIOL   0.25 mcg Oral Q M,W,F   carvedilol   25 mg Oral BID WC   Chlorhexidine  Gluconate Cloth  6 each Topical Q0600   Chlorhexidine  Gluconate Cloth  6 each Topical Q0600   ferrous sulfate   325 mg Oral Daily   heparin   5,000 Units Subcutaneous Q8H   insulin  aspart  0-6 Units Subcutaneous Q4H   multivitamin with minerals  1 tablet Oral Daily   sertraline   25 mg Oral Daily   sodium chloride  flush  3 mL Intravenous Q12H     Almarie Bonine, MD 05/02/2024, 9:25 AM

## 2024-05-02 NOTE — Progress Notes (Signed)
 Physical Therapy Treatment Patient Details Name: Kelly Dunn MRN: [id number] DOB: [birth year] Today's Date: 05/02/2024   History of Present Illness 78 y/o male admitted 12/5 with sepsis CT (negative). PMHx: DM2, HTN, HLD, CKDV, L fistula in April 2025 not on HD yet.    PT Comments  Pt tolerated treatment well today. Pt with significant improvement compared to previous session. Pt today was able to ambulate in hallway and navigate stairs with CGA. Chair follow provided however ultimately not needed. Cues for proximity and safety with RW. No change in DC/DME recs at this time. PT will continue to follow.     If plan is discharge home, recommend the following: A lot of help with walking and/or transfers;A lot of help with bathing/dressing/bathroom   Can travel by private vehicle     Yes  Equipment Recommendations  Rolling walker (2 wheels);BSC/3in1    Recommendations for Other Services       Precautions / Restrictions Precautions Precautions: Fall Recall of Precautions/Restrictions: Intact Precaution/Restrictions Comments: delirium prevention precautions; LUE AV fistula Restrictions Weight Bearing Restrictions Per Provider Order: No     Mobility  Bed Mobility               General bed mobility comments: Up in recliner    Transfers Overall transfer level: Needs assistance Equipment used: Rolling walker (2 wheels) Transfers: Sit to/from Stand Sit to Stand: Contact guard assist           General transfer comment: Cues for hand placement.    Ambulation/Gait Ambulation/Gait assistance: Contact guard assist, +2 safety/equipment Gait Distance (Feet): 100 Feet Assistive device: Rolling walker (2 wheels) Gait Pattern/deviations: Step-through pattern, Decreased step length - right, Decreased step length - left Gait velocity: decreased     General Gait Details: Cues to self-monitor for activity tolerance; noting more need for physical assist with turns; benefits  from bil UE support. Chair follow provided but ultimately not needed.   Stairs Stairs: Yes Stairs assistance: Contact guard assist Stair Management: Two rails, Alternating pattern, Forwards Number of Stairs: 2 General stair comments: no LOB noted   Wheelchair Mobility     Tilt Bed    Modified Rankin (Stroke Patients Only)       Balance Overall balance assessment: Needs assistance Sitting-balance support: Single extremity supported, Feet supported Sitting balance-Leahy Scale: Fair Sitting balance - Comments: seated EOB, intermittent CGA   Standing balance support: Bilateral upper extremity supported, During functional activity, Reliant on assistive device for balance Standing balance-Leahy Scale: Poor Standing balance comment: reliant on RW                            Communication Communication Communication: Impaired Factors Affecting Communication: Reduced clarity of speech  Cognition Arousal: Alert Behavior During Therapy: WFL for tasks assessed/performed   PT - Cognitive impairments: No apparent impairments                         Following commands: Intact      Cueing Cueing Techniques: Verbal cues, Visual cues  Exercises      General Comments General comments (skin integrity, edema, etc.): VSS      Pertinent Vitals/Pain Pain Assessment Pain Assessment: No/denies pain    Home Living                          Prior Function  PT Goals (current goals can now be found in the care plan section) Progress towards PT goals: Progressing toward goals    Frequency    Min 2X/week      PT Plan      Co-evaluation              AM-PAC PT 6 Clicks Mobility   Outcome Measure  Help needed turning from your back to your side while in a flat bed without using bedrails?: A Little Help needed moving from lying on your back to sitting on the side of a flat bed without using bedrails?: A Little Help needed  moving to and from a bed to a chair (including a wheelchair)?: A Little Help needed standing up from a chair using your arms (e.g., wheelchair or bedside chair)?: A Little Help needed to walk in hospital room?: A Lot Help needed climbing 3-5 steps with a railing? : A Lot 6 Click Score: 16    End of Session Equipment Utilized During Treatment: Gait belt Activity Tolerance: Patient tolerated treatment well Patient left: in bed;with call bell/phone within reach;with bed alarm set Nurse Communication: Mobility status PT Visit Diagnosis: Unsteadiness on feet (R26.81);Other abnormalities of gait and mobility (R26.89);Muscle weakness (generalized) (M62.81)     Time: 8573-8557 PT Time Calculation (min) (ACUTE ONLY): 16 min  Charges:    $Gait Training: 8-22 mins PT General Charges $$ ACUTE PT VISIT: 1 Visit                     Glendora Clouatre B, PT, DPT Acute Rehab Services [phone]    Jacobb Alen 05/02/2024, 4:21 PM

## 2024-05-02 NOTE — Plan of Care (Signed)
  Problem: Skin Integrity: Goal: Risk for impaired skin integrity will decrease Outcome: Progressing   Problem: Activity: Goal: Risk for activity intolerance will decrease Outcome: Progressing

## 2024-05-02 NOTE — Progress Notes (Signed)
  Progress Note    05/02/2024 8:12 AM 1 Day Post-Op  Subjective:  patient seen on dialysis in the process of fistula cannulation   Vitals:   05/02/24 0737 05/02/24 0800  BP: (!) 148/50 (!) 149/51  Pulse: 67 66  Resp: 20 (!) 21  Temp: 98.1 F (36.7 C)   SpO2: 97% 98%   Physical Exam: Extremities:  edematous L arm Neurologic: A&O  CBC    Component Value Date/Time   WBC 4.6 05/02/2024 0755   RBC 2.59 (L) 05/02/2024 0755   HGB 8.1 (L) 05/02/2024 0755   HCT 26.4 (L) 05/02/2024 0755   PLT 100 (L) 05/02/2024 0755   MCV 101.9 (H) 05/02/2024 0755   MCH 31.3 05/02/2024 0755   MCHC 30.7 05/02/2024 0755   RDW 15.0 05/02/2024 0755   LYMPHSABS 1.3 05/02/2024 0755   MONOABS 0.4 05/02/2024 0755   EOSABS 0.1 05/02/2024 0755   BASOSABS 0.0 05/02/2024 0755    BMET    Component Value Date/Time   NA 144 05/01/2024 0755   K 4.2 05/01/2024 0755   CL 107 05/01/2024 0755   CO2 25 05/01/2024 0755   GLUCOSE 89 05/01/2024 0755   BUN 53 (H) 05/01/2024 0755   CREATININE 5.85 (H) 05/01/2024 0755   CALCIUM  9.0 05/01/2024 0755   CALCIUM  9.3 04/07/2024 1027   GFRNONAA 9 (L) 05/01/2024 0755    INR No results found for: INR   Intake/Output Summary (Last 24 hours) at 05/02/2024 9187 Last data filed at 05/01/2024 1842 Gross per 24 hour  Intake 360 ml  Output 200 ml  Net 160 ml     Assessment/Plan:  78 y.o. male is s/p L arm fistulogram with cephalic arch stent 1 Day Post-Op   Patient seen on HD during cannulation.  S/p successful balloon angioplasty and stenting of cephalic arch.  Call vascular if cannulation unsuccessful.   Donnice Sender, PA-C [facility] [phone] 05/02/2024 8:12 AM

## 2024-05-02 NOTE — Progress Notes (Signed)
 PT Cancellation Note  Patient Details Name: Kelly Dunn MRN: [id number] DOB: 15-Oct-1945   Cancelled Treatment:    Reason Eval/Treat Not Completed: Patient at procedure or test/unavailable (Pt off the floor at dialysis. Will follow up later if time allows.)   Jeromiah Ohalloran 05/02/2024, 8:31 AM
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
## 2024-05-03 LAB — GLUCOSE, CAPILLARY
Glucose-Capillary: 101 mg/dL — ABNORMAL HIGH (ref 70–99)
Glucose-Capillary: 121 mg/dL — ABNORMAL HIGH (ref 70–99)
Glucose-Capillary: 161 mg/dL — ABNORMAL HIGH (ref 70–99)
Glucose-Capillary: 96 mg/dL (ref 70–99)
Glucose-Capillary: 109 mg/dL — ABNORMAL HIGH (ref 70–99)
Glucose-Capillary: 120 mg/dL — ABNORMAL HIGH (ref 70–99)

## 2024-05-03 LAB — CULTURE, BLOOD (ROUTINE X 2)
Culture: NO GROWTH
Culture: NO GROWTH

## 2024-05-03 NOTE — Plan of Care (Signed)

## 2024-05-03 NOTE — Plan of Care (Signed)
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
## 2024-05-03 NOTE — Progress Notes (Signed)
 Nutrition Education Note  RD consulted for Renal Education. Nutrition for People on Dialysis handout provided as well as Grocery Guide for Kidney Disease. Reviewed food groups and provided written recommended serving sizes specifically determined for patient's current nutritional status.   Explained why diet restrictions are needed and provided lists of foods to limit/avoid that are high potassium, sodium, and phosphorus. Provided specific recommendations on safer alternatives of these foods. Strongly encouraged compliance of this diet.   Discussed importance of protein intake at each meal and snack. Provided examples of how to maximize protein intake throughout the day. Discussed need for fluid restriction with dialysis, importance of minimizing weight gain between HD treatments, and renal-friendly beverage options.  He remains a bit confused, so education likely not adequately received. Encouraged pt to discuss specific diet questions/concerns with RD at HD outpatient facility. Also called patient's daughter, Sharlet and shared the same information and encouraged familiarizing family with diet recommendations, but conferring with RD at HD facility for individualized nutrition-related questions based off lab trends and not snapshots of his nutrition-related labs. Notably, he is still being worked up to his full HD tx prescription.    Expect adequate compliance.  Body mass index is 29.96 kg/m. Pt meets criteria for overweight for age based on current BMI. Notably, this may be skewed by fluid status. Monitor trends and re-assess muscle and fat depletions with repeat NFPE, if indicated. He does endorse some weight loss over the last three years after the loss of his wife, but appears with adequate weight to frame and muscle mass.   Current diet order is renal diet, fluid restriction, patient is consuming approximately 75-100% of meals at this time. Labs and medications reviewed. No further  nutrition interventions warranted at this time. RD contact information provided. If additional nutrition issues arise, please re-consult RD.  Blair Deaner MS, RD, LDN Registered Dietitian Clinical Nutrition RD Inpatient Contact Info in Amion

## 2024-05-03 NOTE — Progress Notes (Signed)
 PROGRESS NOTE  Kelly Dunn  DOB: [birth year]  PCP: Regino Slater, MD FMW:[id number]  DOA: 04/28/2024  LOS: 5 days  Hospital Day: 6  Subjective: Patient was seen and examined this morning. Sitting up at the edge of the bed.  Not in distress. Getting ready to work with physical therapy. Afebrile, hemodynamically stable with blood pressure 140s and 150s Blood sugar level in stable range.  Brief narrative: Kelly Dunn is a 78 y.o. male with PMH significant for DM2, HTN, HLD, CKD 5, gout, neuropathy.  Follows up with nephrology as an outpatient, had left AV fistula created in April 2025 not on HD yet 12/5, patient presented from home with a progressive agitation, confusion.  Per family, he had similar episode earlier this year.  He has been progressively getting confused-with delusion, hallucination that someone was in a house.  911 was called, subsequently he was found in the neighbors driveway, confused, agitated, on the ground and was brought to the ED   Upon arrival to ED, he was hypothermic with bradycardia  BMP showed hyperkalemia and elevated creatinine CT head unremarkable  chest x-ray and UA unremarkable.   Admitted to [facility]  Multiple services including nephrology, palliative care and psychiatry were consulted  Patient was initiated on dialysis.  Assessment and plan: Progressive CKD New ESRD Has LUE AVF.   Started on dialysis on 12/6 due to uremic symptoms. 12/8, patient required balloon angioplasty and stenting of the cephalic arc of the fistula by vascular surgery. 12/9, able to cannulate the fistula and complete dialysis Outpatient dialysis arrangement in process.  Acute metabolic encephalopathy Likely secondary to uremia in the setting of advanced CKD.  Patient was also on gabapentin which has now been stopped Expect further improvement in mental status with subsequent dialysis Seen by psychiatry as well. Continue Zoloft  25 mg daily On Haldol  2 mg IV  every 6 as needed for agitation EKG 12/7 QTc 420 ms. May need soft restraints.  Clearly instructed nursing staff to avoid putting tight restraints on the fistula arm. Mental status significantly better.  Slow to respond but able to have meaningful conversation. Psychiatry signed off  SIRS Was hypothermic and bradycardic on admission Likely because he was out on the cold.  Was found on neighbor's driveway confused. No evidence of infection. Blood culture sent on admission did not show any growth. Empiric antibiotics have been discontinued. Recent Labs  Lab 04/28/24 0439 04/28/24 0914 04/28/24 1200 04/28/24 2348 05/01/24 0755 05/02/24 0755  WBC 4.8  --   --  5.0 4.8 4.6  LATICACIDVEN  --  1.1 1.3  --   --   --   PROCALCITON 0.40  --   --   --   --   --    Hyperkalemia Potassium level improved with dialysis Recent Labs  Lab 04/28/24 0439 04/28/24 0445 04/29/24 0223 04/30/24 0534 05/01/24 0352 05/01/24 0755 05/02/24 0755  K 5.2*   < > 4.5 4.0 4.1 4.2 4.2  MG 2.2  --   --   --   --   --   --   PHOS 4.5  --  3.9  --   --  3.9 3.8   < > = values in this interval not displayed.   Essential hypertension Blood pressure controlled, continue current management. PTA meds- Coreg  25 mg twice daily amlodipine  10 mg daily, Lasix  twice daily Currently continued on Coreg  and amlodipine   Type 2 diabetes mellitus Hypoglycemia A1c 4.3 on 04/28/24 PTA meds- Lantus  Was hypoglycemic on  12/6.  Lantus  was discontinued Currently only on SSI/Accu-Cheks. Insulin  requirement may increase since he has been started on dialysis.  Continue to monitor Recent Labs  Lab 05/02/24 2129 05/02/24 2311 05/03/24 0358 05/03/24 0750 05/03/24 1131  GLUCAP 155* 128* 101* 121* 161*   HLD Lipitor 40 mg daily  Chronic macrocytic anemia Hemoglobin at baseline between 9 and 10.  Continue to monitor. Anemia panel showed ferritin level of 293. Recent Labs    12/31/23 0931 12/31/23 0937 01/28/24 1239  01/28/24 1241 02/25/24 1228 02/25/24 1236 04/07/24 1027 04/07/24 1031 04/28/24 0439 04/28/24 0445 04/28/24 2348 04/30/24 1019 05/01/24 0755 05/02/24 0755  HGB  --    < >  --    < >  --    < >  --    < > 10.6* 12.2* 9.0*  --  8.6* 8.1*  MCV  --   --   --   --   --   --   --    < > 100.9*  --  99.3  --  99.6 101.9*  VITAMINB12  --   --   --   --   --   --   --   --   --   --   --  234  --   --   FOLATE  --   --   --   --   --   --   --   --   --   --   --  5.1*  --   --   FERRITIN 128  --  262  --  282  --  237  --   --   --   --  293  --   --   TIBC 204*  --  211*  --  197*  --  195*  --   --   --   --  161*  --   --   IRON 70  --  72  --  85  --  65  --   --   --   --  61  --   --   RETICCTPCT  --   --   --   --   --   --   --   --   --   --   --  1.3  --   --    < > = values in this interval not displayed.   H/o gout Continue allopurinol .  Diabetic neuropathy Gabapentin on hold because of altered mentation.  Nephrology recommends against restarting it.  Overweight Body mass index is 29.96 kg/m. Patient has been advised to make an attempt to improve diet and exercise patterns to aid in weight loss.  Impaired mobility:  Seen by PT PT Follow up Rec: Skilled Nursing-Short Term Rehab (<3 Hours/Day)05/03/2024 1200    Goals of care   Code Status: Limited: Do not attempt resuscitation (DNR) -DNR-LIMITED -Do Not Intubate/DNI      DVT prophylaxis:  heparin  injection 5,000 Units Start: 04/28/24 0815 SCDs Start: 04/28/24 0759   Antimicrobials: None currently Fluid: None Consultants: Nephrology Family Communication: None at bedside  Status: Inpatient Level of care:  Med-Surg   Patient is from: Home Needs to continue in-hospital care: newly started on dialysis.  Outpatient chair arrangement in process    Diet:  Diet Order             Diet renal with fluid restriction Fluid restriction: 1200 mL Fluid;  Room service appropriate? Yes; Fluid consistency: Thin  Diet  effective now                   Scheduled Meds:  allopurinol   100 mg Oral Daily   amLODipine   10 mg Oral QPM   aspirin  EC  81 mg Oral Daily   atorvastatin   40 mg Oral Daily   calcitRIOL   0.25 mcg Oral Q M,W,F   carvedilol   25 mg Oral BID WC   Chlorhexidine  Gluconate Cloth  6 each Topical Q0600   Chlorhexidine  Gluconate Cloth  6 each Topical Q0600   ferrous sulfate   325 mg Oral Daily   heparin   5,000 Units Subcutaneous Q8H   insulin  aspart  0-6 Units Subcutaneous Q4H   multivitamin with minerals  1 tablet Oral Daily   sertraline   25 mg Oral Daily   sodium chloride  flush  3 mL Intravenous Q12H    PRN meds: acetaminophen  **OR** acetaminophen , bisacodyl , haloperidol  lactate, hydrALAZINE , HYDROmorphone  (DILAUDID ) injection, ipratropium, ondansetron  **OR** ondansetron  (ZOFRAN ) IV, oxyCODONE , senna-docusate   Infusions:    Antimicrobials: Anti-infectives (From admission, onward)    Start     Dose/Rate Route Frequency Ordered Stop   04/29/24 0900  ceFEPIme  (MAXIPIME ) 2 g in sodium chloride  0.9 % 100 mL IVPB  Status:  Discontinued        2 g 200 mL/hr over 30 Minutes Intravenous Every 24 hours 04/28/24 0851 04/29/24 0847   04/28/24 0851  vancomycin  variable dose per unstable renal function (pharmacist dosing)  Status:  Discontinued         Does not apply See admin instructions 04/28/24 0851 04/29/24 0847   04/28/24 0830  ceFEPIme  (MAXIPIME ) 2 g in sodium chloride  0.9 % 100 mL IVPB        2 g 200 mL/hr over 30 Minutes Intravenous  Once 04/28/24 0820 04/28/24 0955   04/28/24 0830  metroNIDAZOLE  (FLAGYL ) IVPB 500 mg  Status:  Discontinued        500 mg 100 mL/hr over 60 Minutes Intravenous Every 12 hours 04/28/24 0820 04/29/24 0847   04/28/24 0830  vancomycin  (VANCOREADY) IVPB 2000 mg/400 mL        2,000 mg 200 mL/hr over 120 Minutes Intravenous  Once 04/28/24 0820 04/28/24 1208       Objective: Vitals:   05/03/24 0327 05/03/24 0900  BP: (!) 141/57 (!) 153/59  Pulse: 69  69  Resp:  18  Temp: 98.7 F (37.1 C) 98.6 F (37 C)  SpO2:  100%    Intake/Output Summary (Last 24 hours) at 05/03/2024 1237 Last data filed at 05/03/2024 1020 Gross per 24 hour  Intake 420 ml  Output 300 ml  Net 120 ml   Filed Weights   05/01/24 0758 05/02/24 0733 05/02/24 1128  Weight: 98.9 kg 101.1 kg 100.2 kg   Weight change: 2.2 kg Body mass index is 29.96 kg/m.   Physical Exam: General exam: Pleasant, elderly African-American male.  Not in distress Skin: No rashes, lesions or ulcers. HEENT: Atraumatic, normocephalic, no obvious bleeding Lungs: Clear to auscultation bilaterally,  CVS: S1, S2, no murmur,   GI/Abd: Soft, nontender, nondistended, bowel sound present,   CNS: Alert, awake, oriented to place and person.  Slow to respond Psychiatry: Mood appropriate Extremities: No pedal edema, no calf tenderness,   Data Review: I have personally reviewed the laboratory data and studies available.  F/u labs ordered [employer] (From admission, onward)     Start     Ordered  04/28/24 0759  Expectorated Sputum Assessment w Gram Stain, Rflx to Resp Cult  Once,   R       Comments: If productive cough    04/28/24 0800            Signed, Chapman Rota, MD [facility] 05/03/2024

## 2024-05-03 NOTE — Care Management Important Message (Signed)
 Important Message  Patient Details  Name: Kelly Dunn MRN: [id number] Date of Birth: [birth year]   Important Message Given:  Yes - Medicare IM     Claretta Deed 05/03/2024, 2:59 PM

## 2024-05-03 NOTE — Progress Notes (Signed)
 Occupational Therapy Treatment Patient Details Name: Kelly Dunn MRN: [id number] DOB: [birth year] Today's Date: 05/03/2024   History of present illness 78 y/o male admitted 12/5 with sepsis CT (negative). PMHx: DM2, HTN, HLD, CKDV, L fistula in April 2025 not on HD yet.   OT comments  Patient demonstrating good gains with OT treatment. Patient able to stand at sink with CGA for grooming and bathing tasks.  Patient requiring mod assist for LB dressing due to difficulty reaching feet or maintaining figure 4 position. Patient will benefit from continued inpatient follow up therapy, <3 hours/day.  Acute OT to continue to follow to address established goals to facilitate DC to next venue of care.        If plan is discharge home, recommend the following:  A lot of help with bathing/dressing/bathroom;Assistance with cooking/housework;Direct supervision/assist for medications management;Direct supervision/assist for financial management;Assist for transportation;Help with stairs or ramp for entrance;Supervision due to cognitive status;A little help with walking and/or transfers   Equipment Recommendations  Other (comment) (defer to next level of care)    Recommendations for Other Services      Precautions / Restrictions Precautions Precautions: Fall Recall of Precautions/Restrictions: Intact Precaution/Restrictions Comments: delirium prevention precautions; LUE AV fistula Restrictions Weight Bearing Restrictions Per Provider Order: No       Mobility Bed Mobility Overal bed mobility: Needs Assistance Bed Mobility: Supine to Sit     Supine to sit: Contact guard, HOB elevated, Used rails     General bed mobility comments: CGA with trunk    Transfers Overall transfer level: Needs assistance Equipment used: Rolling walker (2 wheels) Transfers: Sit to/from Stand Sit to Stand: Contact guard assist           General transfer comment: cues for hand placement and CGA to stand  from EOB and chair     Balance Overall balance assessment: Needs assistance Sitting-balance support: Single extremity supported, Feet supported Sitting balance-Leahy Scale: Fair Sitting balance - Comments: on EOB   Standing balance support: Single extremity supported, Bilateral upper extremity supported, During functional activity Standing balance-Leahy Scale: Poor Standing balance comment: reliant on RW and sink for support                           ADL either performed or assessed with clinical judgement   ADL Overall ADL's : Needs assistance/impaired     Grooming: Wash/dry hands;Wash/dry face;Oral care;Contact guard assist;Standing;Applying deodorant   Upper Body Bathing: Contact guard assist;Standing   Lower Body Bathing: Moderate assistance Lower Body Bathing Details (indicate cue type and reason): assistance to reach feet Upper Body Dressing : Supervision/safety;Sitting Upper Body Dressing Details (indicate cue type and reason): pullover top Lower Body Dressing: Moderate assistance;Sitting/lateral leans Lower Body Dressing Details (indicate cue type and reason): assistance to thread legs into shorts and assistance to begin socks             Functional mobility during ADLs: Contact guard assist;Rolling walker (2 wheels)      Extremity/Trunk Assessment              Vision       Perception     Praxis     Communication Communication Communication: Impaired Factors Affecting Communication: Reduced clarity of speech   Cognition Arousal: Alert Behavior During Therapy: WFL for tasks assessed/performed Cognition: Cognition impaired     Awareness: Intellectual awareness impaired Memory impairment (select all impairments): Short-term memory, Working memory Attention impairment (select first level of  impairment): Sustained attention Executive functioning impairment (select all impairments): Problem solving OT - Cognition Comments: improved  orientation                 Following commands: Intact        Cueing   Cueing Techniques: Verbal cues, Visual cues  Exercises      Shoulder Instructions       General Comments VSS    Pertinent Vitals/ Pain       Pain Assessment Pain Assessment: No/denies pain  Home Living                                          Prior Functioning/Environment              Frequency  Min 2X/week        Progress Toward Goals  OT Goals(current goals can now be found in the care plan section)  Progress towards OT goals: Progressing toward goals  Acute Rehab OT Goals Patient Stated Goal: to go to rehab OT Goal Formulation: With patient Time For Goal Achievement: 05/14/24 Potential to Achieve Goals: Good ADL Goals Pt Will Perform Grooming: with contact guard assist;standing Pt Will Perform Lower Body Bathing: with min assist;sitting/lateral leans;sit to/from stand Pt Will Perform Lower Body Dressing: with min assist;sitting/lateral leans;sit to/from stand Pt Will Transfer to Toilet: with contact guard assist;ambulating;regular height toilet;grab bars Pt Will Perform Toileting - Clothing Manipulation and hygiene: with min assist;sitting/lateral leans;sit to/from stand  Plan      Co-evaluation                 AM-PAC OT 6 Clicks Daily Activity     Outcome Measure   Help from another person eating meals?: None Help from another person taking care of personal grooming?: A Little Help from another person toileting, which includes using toliet, bedpan, or urinal?: A Little Help from another person bathing (including washing, rinsing, drying)?: A Lot Help from another person to put on and taking off regular upper body clothing?: A Little Help from another person to put on and taking off regular lower body clothing?: A Lot 6 Click Score: 17    End of Session Equipment Utilized During Treatment: Gait belt;Rolling walker (2 wheels)  OT Visit  Diagnosis: Unsteadiness on feet (R26.81);Other abnormalities of gait and mobility (R26.89);Repeated falls (R29.6);Other symptoms and signs involving cognitive function;History of falling (Z91.81)   Activity Tolerance Patient tolerated treatment well   Patient Left in chair;with call bell/phone within reach;with chair alarm set   Nurse Communication Mobility status        Time: 9282-9253 OT Time Calculation (min): 29 min  Charges: OT General Charges $OT Visit: 1 Visit OT Treatments $Self Care/Home Management : 23-37 mins  Kelly Dunn, OTA Acute Rehabilitation Services  Office [phone]   Kelly Dunn 05/03/2024, 7:59 AM

## 2024-05-03 NOTE — Progress Notes (Signed)
.  [facility] Progress Note   Assessment/ Plan:   # CKD stage V with progression to ESRD  - With uremic symptoms/AMS.  HD started on 12/6 via his existing LUE AVF - Plan for HD on 12/8-- infiltration - VVS with fgram yesterday, greatly appreciate - HD 12/9, next 12/11   # AMS - sundowning vs gaba toxicity vs uremic - getting better   # Hyperkalemia  - mild and improved with HD - continue renal diabetic diet      # HTN  - he didn't get amlodipine  on 12/6. Retimed this for evening dosing so that he doesn't end up missing on HD days   # Anemia of CKD  - last retacrit  10,000 units 04/24/24 - follow and dose when appropriate   # Metabolic bone disease - calcitriol  with dialysis  Subjective:    Seen and examined.  Still a little confused, getting better per nursing.     Objective:   BP (!) 153/59   Pulse 69   Temp 98.6 F (37 C) (Oral)   Resp 18   Ht 6' (1.829 m)   Wt 100.2 kg   SpO2 100%   BMI 29.96 kg/m   Physical Exam: Hzw:noizm, ill appearing CVS:RRR Resp:clear Jai:dnqu Ext:2+ LE edema, healed skin tears ACCESS: L AVF + T/B  Labs: BMET Recent Labs  Lab 04/28/24 0439 04/28/24 0445 04/28/24 1200 04/29/24 0223 04/30/24 0534 05/01/24 0352 05/01/24 0755 05/02/24 0755  NA 145 145 145 142 141 145 144 142  K 5.2* 5.2* 4.3 4.5 4.0 4.1 4.2 4.2  CL 105 109 115* 112* 106 110 107 106  CO2 21*  --  20* 22 27 24 25  16*  GLUCOSE 277* 262* 84 125* 79 95 89 103*  BUN 61* 58* 63* 66* 42* 53* 53* 60*  CREATININE 6.62* 6.60* 6.32* 6.46* 5.03* 5.80* 5.85* 6.31*  CALCIUM  10.0  --  8.9 9.0 8.7* 8.8* 9.0 8.9  PHOS 4.5  --   --  3.9  --   --  3.9 3.8   CBC Recent Labs  Lab 04/28/24 0439 04/28/24 0445 04/28/24 2348 05/01/24 0755 05/02/24 0755  WBC 4.8  --  5.0 4.8 4.6  NEUTROABS 3.5  --  3.8  --  2.8  HGB 10.6* 12.2* 9.0* 8.6* 8.1*  HCT 34.1* 36.0* 28.2* 27.3* 26.4*  MCV 100.9*  --  99.3 99.6 101.9*  PLT 113*  --  115* 107* 100*       Medications:     allopurinol   100 mg Oral Daily   amLODipine   10 mg Oral QPM   aspirin  EC  81 mg Oral Daily   atorvastatin   40 mg Oral Daily   calcitRIOL   0.25 mcg Oral Q M,W,F   carvedilol   25 mg Oral BID WC   Chlorhexidine  Gluconate Cloth  6 each Topical Q0600   Chlorhexidine  Gluconate Cloth  6 each Topical Q0600   ferrous sulfate   325 mg Oral Daily   heparin   5,000 Units Subcutaneous Q8H   insulin  aspart  0-6 Units Subcutaneous Q4H   multivitamin with minerals  1 tablet Oral Daily   sertraline   25 mg Oral Daily   sodium chloride  flush  3 mL Intravenous Q12H     Almarie Bonine, MD 05/03/2024, 2:44 PM

## 2024-05-04 ENCOUNTER — Encounter (HOSPITAL_COMMUNITY): Payer: Self-pay | Admitting: Family Medicine

## 2024-05-04 ENCOUNTER — Encounter (HOSPITAL_COMMUNITY)
Admission: EM | Disposition: A | Discharge status: Skilled Nursing Facility | Payer: Self-pay | Source: Home / Self Care | Attending: Family Medicine

## 2024-05-04 ENCOUNTER — Inpatient Hospital Stay (HOSPITAL_COMMUNITY): Admitting: Anesthesiology

## 2024-05-04 ENCOUNTER — Inpatient Hospital Stay (HOSPITAL_COMMUNITY)

## 2024-05-04 DIAGNOSIS — Z992 Dependence on renal dialysis: Secondary | ICD-10-CM | POA: Diagnosis not present

## 2024-05-04 DIAGNOSIS — N186 End stage renal disease: Secondary | ICD-10-CM | POA: Diagnosis not present

## 2024-05-04 HISTORY — PX: INSERTION OF DIALYSIS CATHETER: SHX1324

## 2024-05-04 LAB — GLUCOSE, CAPILLARY
Glucose-Capillary: 102 mg/dL — ABNORMAL HIGH (ref 70–99)
Glucose-Capillary: 92 mg/dL (ref 70–99)
Glucose-Capillary: 94 mg/dL (ref 70–99)
Glucose-Capillary: 107 mg/dL — ABNORMAL HIGH (ref 70–99)
Glucose-Capillary: 99 mg/dL (ref 70–99)
Glucose-Capillary: 151 mg/dL — ABNORMAL HIGH (ref 70–99)
Glucose-Capillary: 74 mg/dL (ref 70–99)

## 2024-05-04 LAB — POCT I-STAT, CHEM 8
BUN: 46 mg/dL — ABNORMAL HIGH (ref 8–23)
Calcium, Ion: 1.16 mmol/L (ref 1.15–1.40)
Chloride: 105 mmol/L (ref 98–111)
Creatinine, Ser: 5.7 mg/dL — ABNORMAL HIGH (ref 0.61–1.24)
Glucose, Bld: 90 mg/dL (ref 70–99)
HCT: 28 % — ABNORMAL LOW (ref 39.0–52.0)
Hemoglobin: 9.5 g/dL — ABNORMAL LOW (ref 13.0–17.0)
Potassium: 4 mmol/L (ref 3.5–5.1)
Sodium: 141 mmol/L (ref 135–145)
TCO2: 26 mmol/L (ref 22–32)

## 2024-05-04 MED ORDER — PROPOFOL 10 MG/ML IV BOLUS
INTRAVENOUS | Status: AC
  Filled 2024-05-04: qty 20

## 2024-05-04 MED ORDER — HEPARIN SODIUM (PORCINE) 1000 UNIT/ML IJ SOLN
INTRAMUSCULAR | Status: AC
  Filled 2024-05-04: qty 10

## 2024-05-04 MED ORDER — ORAL CARE MOUTH RINSE: 15.0000 mL | Freq: Once | OROMUCOSAL | Status: AC

## 2024-05-04 MED ORDER — HEPARIN 6000 UNIT IRRIGATION SOLUTION
Status: DC | PRN
  Administered 2024-05-04: 1

## 2024-05-04 MED ORDER — SODIUM CHLORIDE 0.9 % IV SOLN: INTRAVENOUS | Status: DC

## 2024-05-04 MED ORDER — LIDOCAINE-EPINEPHRINE (PF) 1 %-1:200000 IJ SOLN
INTRAMUSCULAR | Status: AC
  Filled 2024-05-04: qty 30

## 2024-05-04 MED ORDER — FENTANYL CITRATE (PF) 100 MCG/2ML IJ SOLN
INTRAMUSCULAR | Status: DC | PRN
  Administered 2024-05-04 (×4): 25 ug via INTRAVENOUS

## 2024-05-04 MED ORDER — 0.9 % SODIUM CHLORIDE (POUR BTL) OPTIME
TOPICAL | Status: DC | PRN
  Administered 2024-05-04: 1000 mL

## 2024-05-04 MED ORDER — LIDOCAINE-EPINEPHRINE (PF) 1 %-1:200000 IJ SOLN
INTRAMUSCULAR | Status: DC | PRN
  Administered 2024-05-04: 14 mL

## 2024-05-04 MED ORDER — INSULIN ASPART 100 UNIT/ML IJ SOLN
0.0000 [IU] | INTRAMUSCULAR | Status: DC | PRN
  Filled 2024-05-04: qty 0.07

## 2024-05-04 MED ORDER — HEPARIN SODIUM (PORCINE) 1000 UNIT/ML IJ SOLN
INTRAMUSCULAR | Status: DC | PRN
  Administered 2024-05-04: 1600 [IU]

## 2024-05-04 MED ORDER — CHLORHEXIDINE GLUCONATE 0.12 % MT SOLN
15.0000 mL | Freq: Once | OROMUCOSAL | Status: AC
  Administered 2024-05-04: 15 mL via OROMUCOSAL

## 2024-05-04 MED ORDER — HEPARIN 6000 UNIT IRRIGATION SOLUTION
Status: AC
  Filled 2024-05-04: qty 500

## 2024-05-04 MED ORDER — FENTANYL CITRATE (PF) 100 MCG/2ML IJ SOLN
INTRAMUSCULAR | Status: AC
  Filled 2024-05-04: qty 2

## 2024-05-04 MED ORDER — CEFAZOLIN SODIUM-DEXTROSE 2-3 GM-%(50ML) IV SOLR
INTRAVENOUS | Status: DC | PRN
  Administered 2024-05-04: 2 g via INTRAVENOUS

## 2024-05-04 SURGICAL SUPPLY — 1 items: NDL HYPO 25GX1X1/2 BEV (NEEDLE) ×1 IMPLANT

## 2024-05-04 NOTE — Progress Notes (Signed)
 PROGRESS NOTE  Kelly Dunn  DOB: 1945/09/14  PCP: Regino Slater, MD FMW:[id number]  DOA: 04/28/2024  LOS: 6 days  Hospital Day: 7  Subjective: Patient was seen and examined this morning. Lying on bed.  Not in distress. Afebrile, hemodynamically stable, breathing room air Blood glucose level in acceptable range Patient was taken for dialysis this morning but it seems he had problem with left AV fistula again and hence dialysis could not be completed  Brief narrative: Kelly Dunn is a 78 y.o. male with PMH significant for DM2, HTN, HLD, CKD 5, gout, neuropathy.  Follows up with nephrology as an outpatient, had left AV fistula created in April 2025 not on HD yet 12/5, patient presented from home with a progressive agitation, confusion.  Per family, he had similar episode earlier this year.  He has been progressively getting confused-with delusion, hallucination that someone was in a house.  911 was called, subsequently he was found in the neighbors driveway, confused, agitated, on the ground and was brought to the ED   Upon arrival to ED, he was hypothermic with bradycardia  BMP showed hyperkalemia and elevated creatinine CT head unremarkable  chest x-ray and UA unremarkable.   Admitted to [facility]  Multiple services including nephrology, palliative care and psychiatry were consulted  Patient was initiated on dialysis.  Assessment and plan: Progressive CKD New ESRD Has LUE AVF.  Started on dialysis on 12/6 due to uremic symptoms. 12/8, patient required balloon angioplasty and stenting of the cephalic arc of the fistula by vascular surgery. 12/9, able to cannulate the fistula and complete dialysis 12/11, has complications with AV fistula again and hence dialysis can be done.  Noted the plan of placement of tunneled dialysis catheter vascular surgery today. Outpatient dialysis arrangement in process.  Acute metabolic encephalopathy Likely secondary to uremia in the setting  of advanced CKD.  Patient was also on gabapentin which has now been stopped Seen by psychiatry as well. Initially required chemical and physical restraints. Mental status gradually improved with subsequent dialysis. Still remains intermittently confused but not agitated or violent Currently continued on Zoloft  25 mg daily, Haldol  2 mg IV every 6 hours PRN for agitation EKG 12/7 QTc 420 ms. Patient needs physical restraints, nursing staff have been clearly instructed to avoid putting tight restraints on the fistula arm.  SIRS Was hypothermic and bradycardic on admission Likely because he was out on the cold.  Was found on neighbor's driveway confused. No evidence of infection. Blood culture sent on admission did not show any growth. Empiric antibiotics have been discontinued. Recent Labs  Lab 04/28/24 0439 04/28/24 0914 04/28/24 1200 04/28/24 2348 05/01/24 0755 05/02/24 0755  WBC 4.8  --   --  5.0 4.8 4.6  LATICACIDVEN  --  1.1 1.3  --   --   --   PROCALCITON 0.40  --   --   --   --   --    Hyperkalemia Potassium level improved with dialysis Recent Labs  Lab 04/28/24 0439 04/28/24 0445 04/29/24 0223 04/30/24 0534 05/01/24 0352 05/01/24 0755 05/02/24 0755 05/04/24 1324  K 5.2*   < > 4.5 4.0 4.1 4.2 4.2 4.0  MG 2.2  --   --   --   --   --   --   --   PHOS 4.5  --  3.9  --   --  3.9 3.8  --    < > = values in this interval not displayed.   Essential hypertension  Blood pressure controlled, continue current management. PTA meds- Coreg  25 mg twice daily amlodipine  10 mg daily, Lasix  twice daily Currently continued on Coreg  and amlodipine   Type 2 diabetes mellitus Hypoglycemia A1c 4.3 on 04/28/24 PTA meds- Lantus  Was hypoglycemic on 12/6.  Lantus  was discontinued Currently blood sugar is in acceptable range only on SSI/Accu-Cheks. Insulin  requirement may increase since he has been started on dialysis.  Continue to monitor Recent Labs  Lab 05/03/24 1951 05/03/24 2317  05/04/24 0340 05/04/24 0911 05/04/24 1126  GLUCAP 109* 120* 102* 92 94   HLD Lipitor 40 mg daily  Chronic macrocytic anemia Hemoglobin at baseline between 9 and 10.  Currently mostly between 8 and 9.  No active bleeding.  Continue to monitor. Anemia panel showed ferritin level of 293. Recent Labs    12/31/23 0931 12/31/23 9062 01/28/24 1239 01/28/24 1241 02/25/24 1228 02/25/24 1236 04/07/24 1027 04/07/24 1031 04/28/24 0445 04/28/24 2348 04/30/24 1019 05/01/24 0755 05/02/24 0755 05/04/24 1324  HGB  --    < >  --    < >  --    < >  --    < > 12.2* 9.0*  --  8.6* 8.1* 9.5*  MCV  --   --   --   --   --   --   --    < >  --  99.3  --  99.6 101.9*  --   VITAMINB12  --   --   --   --   --   --   --   --   --   --  234  --   --   --   FOLATE  --   --   --   --   --   --   --   --   --   --  5.1*  --   --   --   FERRITIN 128  --  262  --  282  --  237  --   --   --  293  --   --   --   TIBC 204*  --  211*  --  197*  --  195*  --   --   --  161*  --   --   --   IRON 70  --  72  --  85  --  65  --   --   --  61  --   --   --   RETICCTPCT  --   --   --   --   --   --   --   --   --   --  1.3  --   --   --    < > = values in this interval not displayed.   H/o gout Continue allopurinol .  Diabetic neuropathy Gabapentin on hold because of altered mentation.  Nephrology recommends against restarting it.  Overweight Body mass index is 28.7 kg/m. Patient has been advised to make an attempt to improve diet and exercise patterns to aid in weight loss.  Impaired mobility:  Seen by PT PT Follow up Rec: Skilled Nursing-Short Term Rehab (<3 Hours/Day)05/03/2024 1200    Goals of care   Code Status: Limited: Do not attempt resuscitation (DNR) -DNR-LIMITED -Do Not Intubate/DNI      DVT prophylaxis:  heparin  injection 5,000 Units Start: 04/28/24 0815 SCDs Start: 04/28/24 0759   Antimicrobials: None currently Fluid: None Consultants: Nephrology Family Communication:  None at  bedside  Status: Inpatient Level of care:  Med-Surg   Patient is from: Home Needs to continue in-hospital care: newly started on dialysis.  Outpatient chair arrangement in process    Diet:  Diet Order             Diet NPO time specified  Diet effective now                   Scheduled Meds:  [MAR Hold] allopurinol   100 mg Oral Daily   [MAR Hold] amLODipine   10 mg Oral QPM   [MAR Hold] aspirin  EC  81 mg Oral Daily   [MAR Hold] atorvastatin   40 mg Oral Daily   [MAR Hold] calcitRIOL   0.25 mcg Oral Q M,W,F   [MAR Hold] carvedilol   25 mg Oral BID WC   [MAR Hold] Chlorhexidine  Gluconate Cloth  6 each Topical Q0600   [MAR Hold] Chlorhexidine  Gluconate Cloth  6 each Topical Q0600   [MAR Hold] ferrous sulfate   325 mg Oral Daily   [MAR Hold] heparin   5,000 Units Subcutaneous Q8H   [MAR Hold] insulin  aspart  0-6 Units Subcutaneous Q4H   [MAR Hold] multivitamin with minerals  1 tablet Oral Daily   [MAR Hold] sertraline   25 mg Oral Daily   [MAR Hold] sodium chloride  flush  3 mL Intravenous Q12H    PRN meds: [MAR Hold] acetaminophen  **OR** [MAR Hold] acetaminophen , [MAR Hold] bisacodyl , [MAR Hold] haloperidol  lactate, [MAR Hold] hydrALAZINE , [MAR Hold]  HYDROmorphone  (DILAUDID ) injection, insulin  aspart, [MAR Hold] ipratropium, [MAR Hold] ondansetron  **OR** [MAR Hold] ondansetron  (ZOFRAN ) IV, [MAR Hold] oxyCODONE , [MAR Hold] senna-docusate   Infusions:   sodium chloride  10 mL/hr at 05/04/24 1301    Antimicrobials: Anti-infectives (From admission, onward)    Start     Dose/Rate Route Frequency Ordered Stop   04/29/24 0900  ceFEPIme  (MAXIPIME ) 2 g in sodium chloride  0.9 % 100 mL IVPB  Status:  Discontinued        2 g 200 mL/hr over 30 Minutes Intravenous Every 24 hours 04/28/24 0851 04/29/24 0847   04/28/24 0851  vancomycin  variable dose per unstable renal function (pharmacist dosing)  Status:  Discontinued         Does not apply See admin instructions 04/28/24 0851 04/29/24  0847   04/28/24 0830  ceFEPIme  (MAXIPIME ) 2 g in sodium chloride  0.9 % 100 mL IVPB        2 g 200 mL/hr over 30 Minutes Intravenous  Once 04/28/24 0820 04/28/24 0955   04/28/24 0830  metroNIDAZOLE  (FLAGYL ) IVPB 500 mg  Status:  Discontinued        500 mg 100 mL/hr over 60 Minutes Intravenous Every 12 hours 04/28/24 0820 04/29/24 0847   04/28/24 0830  vancomycin  (VANCOREADY) IVPB 2000 mg/400 mL        2,000 mg 200 mL/hr over 120 Minutes Intravenous  Once 04/28/24 0820 04/28/24 1208       Objective: Vitals:   05/04/24 1159 05/04/24 1238  BP: (!) 155/57 (!) 155/51  Pulse: 64 67  Resp: 16 17  Temp: 98.6 F (37 C) 98.4 F (36.9 C)  SpO2: 98% 94%    Intake/Output Summary (Last 24 hours) at 05/04/2024 1351 Last data filed at 05/04/2024 1159 Gross per 24 hour  Intake --  Output 150 ml  Net -150 ml   Filed Weights   05/02/24 1128 05/04/24 0751 05/04/24 1238  Weight: 100.2 kg 96 kg 96 kg   Weight change:  Body mass  index is 28.7 kg/m.   Physical Exam: General exam: Pleasant, elderly African-American male.  Not in distress Skin: No rashes, lesions or ulcers. HEENT: Atraumatic, normocephalic, no obvious bleeding Lungs: Clear to auscultation bilaterally,  CVS: S1, S2, no murmur,   GI/Abd: Soft, nontender, nondistended, bowel sound present,   CNS: Alert, awake, oriented to place and person.  Slow to respond Psychiatry: Mood appropriate Extremities: No pedal edema, no calf tenderness,   Data Review: I have personally reviewed the laboratory data and studies available.  F/u labs ordered Unresulted Labs (From admission, onward)     Start     Ordered   04/28/24 0759  Expectorated Sputum Assessment w Gram Stain, Rflx to Resp Cult  Once,   R       Comments: If productive cough    04/28/24 0800            Signed, Chapman Rota, MD [facility] 05/04/2024

## 2024-05-04 NOTE — Op Note (Signed)
° ° °  Patient name: Elijio Staples MRN: [id number] DOB: 01-Aug-1945 Sex: male  05/04/2024 Pre-operative Diagnosis: ESRD on HD Post-operative diagnosis:  Same Surgeon:  Norman GORMAN Serve, MD Procedure Performed:  Ultrasound and fluoroscopic guided right IJ tunneled dialysis catheter placement  Indications: Mr. Rothman is a 78 year old male with ESRD on HD.  They have been having difficulty with cannulation and infiltration of his left arm fistula.  He recently underwent fistulogram and stenting of the cephalic arch with Dr. Sheree yesterday.  They continue have trouble with cannulation and therefore requested a tunneled dialysis catheter.  Worrisome benefits were reviewed and he elected to proceed.  Findings:  Patent and compressible right internal jugular Successful placement of tunneled dialysis catheter at atriocaval junction   Procedure:   The patient was brought to the operating room positioned supine on operating room table.  The neck was prepped and draped in the usual sterile fashion.  Anesthesia was induced, preoperative antibiotics were administered and a timeout was performed. Using ultrasound guidance the right internal jugular vein was accessed with micropuncture technique.  Through the micropuncture sheath, the guidewire was advanced into the superior vena cava.  A small incision was made around the skin access point.  The access point was serially dilated under direct fluoroscopic guidance.  A peel-away sheath was introduced into the superior vena cava under fluoroscopic guidance.  A counterincision was made in the chest under the clavicle.  A 19 cm tunnel dialysis catheter was then tunneled under the skin, over the clavicle into the incision in the neck.  The tunneling device was removed and the catheter fed through the peel-away sheath into the superior vena cava.  The peel-away sheath was removed and the catheter gently pulled back.  Adequate position was confirmed with x-ray.  The  catheter was tested and found to aspirate and flush with ease.    The catheter was sutured to the skin and the neck incision was closed with 4-0 Monocryl.  The catheter was then capped and heparin  locked.   Norman GORMAN Serve MD [facility] [place] Office: [phone]

## 2024-05-04 NOTE — TOC Progression Note (Signed)
 Transition of Care [facility]) - Progression Note    Patient Details  Name: Kelly Dunn MRN: [id number] Date of Birth: 16-Dec-1945  [facility] CM/SW Contact  Almarie CHRISTELLA Goodie, [name] Phone Number: 05/04/2024, 9:45 AM  Clinical Narrative:   Patient continues with no bed offers for SNF. Medical workup ongoing, unable to complete HD this morning due to fistula issues. CSW to follow.    Expected Discharge Plan: Skilled Nursing Facility Barriers to Discharge: Continued Medical Work up, English As A Second Language Teacher, Facility will not accept until restraint criteria met, Requiring sitter/restraints, Waiting for outpatient dialysis               Expected Discharge Plan and Services     Post Acute Care Choice: Skilled Nursing Facility Living arrangements for the past 2 months: Single Family Home                                       Social Drivers of Health (SDOH) Interventions SDOH Screenings   Tobacco Use: Medium Risk (04/28/2024)    Readmission Risk Interventions     No data to display

## 2024-05-04 NOTE — Progress Notes (Addendum)
°  Progress Note    05/04/2024 10:07 AM 3 Days Post-Op  Subjective:  no complaints, very drowsy   Vitals:   05/04/24 0751 05/04/24 0816  BP: (!) 146/60 108/77  Pulse: 70 90  Resp: 16 16  Temp: (!) 96 F (35.6 C) 97.6 F (36.4 C)  SpO2: 99% 99%   Physical Exam: Cardiac:  regular Lungs:  non labored Extremities:  left AV fistula with good thrill, ecchymosis and some swelling in left upper arm Neurologic: drowsy  CBC    Component Value Date/Time   WBC 4.6 05/02/2024 0755   RBC 2.59 (L) 05/02/2024 0755   HGB 8.1 (L) 05/02/2024 0755   HCT 26.4 (L) 05/02/2024 0755   PLT 100 (L) 05/02/2024 0755   MCV 101.9 (H) 05/02/2024 0755   MCH 31.3 05/02/2024 0755   MCHC 30.7 05/02/2024 0755   RDW 15.0 05/02/2024 0755   LYMPHSABS 1.3 05/02/2024 0755   MONOABS 0.4 05/02/2024 0755   EOSABS 0.1 05/02/2024 0755   BASOSABS 0.0 05/02/2024 0755    BMET    Component Value Date/Time   NA 142 05/02/2024 0755   K 4.2 05/02/2024 0755   CL 106 05/02/2024 0755   CO2 16 (L) 05/02/2024 0755   GLUCOSE 103 (H) 05/02/2024 0755   BUN 60 (H) 05/02/2024 0755   CREATININE 6.31 (H) 05/02/2024 0755   CALCIUM  8.9 05/02/2024 0755   CALCIUM  9.3 04/07/2024 1027   GFRNONAA 9 (L) 05/02/2024 0755    INR No results found for: INR   Intake/Output Summary (Last 24 hours) at 05/04/2024 1007 Last data filed at 05/03/2024 1300 Gross per 24 hour  Intake 900 ml  Output --  Net 900 ml     Assessment/Plan:  78 y.o. male is s/p Fistulogram with balloon angioplasty of mid upper arm AV fistula and stent of left cephalic arch 3 Days Post-Op   He had successful HD on 12/9 but unable to cannulate for HD today Nephrology requesting placement of a Tunneled Dialysis Catheter He is NPO Consent ordered Plan is for insertion of internal Jugular Tunneled Dialysis Catheter in OR with Dr. Magda this afternoon   Teretha Damme, PA-C [facility] [phone] 05/04/2024 10:07  AM  VASCULAR STAFF ADDENDUM: I have independently interviewed and examined the patient. I agree with the above.   Debby SAILOR. Magda, MD [facility] Phone Number: [phone] 05/04/2024 11:51 AM  VASCULAR STAFF ADDENDUM: I have independently interviewed and examined the patient. I agree with the above.  Plan for internal jugular TDC  Norman GORMAN Serve MD [facility] Phone Number: [phone] 05/04/2024 12:53 PM

## 2024-05-04 NOTE — Plan of Care (Signed)
   Problem: Coping: Goal: Ability to adjust to condition or change in health will improve Outcome: Progressing   Problem: Fluid Volume: Goal: Ability to maintain a balanced intake and output will improve Outcome: Progressing   Problem: Nutritional: Goal: Maintenance of adequate nutrition will improve Outcome: Progressing   Problem: Skin Integrity: Goal: Risk for impaired skin integrity will decrease Outcome: Progressing

## 2024-05-04 NOTE — Progress Notes (Signed)
 Md Gearline was notified about the inability for hemodialysis to be performed secondary to left AV fistula complications.  In stable condition this patient was returned to his room via transportation and report was given to the RN assuming care.

## 2024-05-04 NOTE — Anesthesia Preprocedure Evaluation (Addendum)
 Anesthesia Evaluation  Patient identified by MRN, date of birth, ID band  Reviewed: Allergy & Precautions, NPO status , Patient's Chart, lab work & pertinent test results, reviewed documented beta blocker date and time   History of Anesthesia Complications Negative for: history of anesthetic complications  Airway Mallampati: II  TM Distance: >3 FB Neck ROM: Full    Dental  (+) Teeth Intact, Dental Advisory Given   Pulmonary former smoker   breath sounds clear to auscultation       Cardiovascular hypertension, Pt. on medications and Pt. on home beta blockers  Rhythm:Regular Rate:Normal  EKG (04/2024): NSR, 1st degree AV Block   Neuro/Psych AMS 2/2 Uremia      GI/Hepatic   Endo/Other  diabetes, Type 2, Insulin  Dependent    Renal/GU CRF and DialysisRenal diseaseL AVF s/p Fistulagram 12/8; Last HD 12/9      Musculoskeletal   Abdominal   Peds  Hematology  (+) Blood dyscrasia, anemia Hgb 8.1, Plts 100K (05/02/24)   Anesthesia Other Findings Gout  Reproductive/Obstetrics                              Anesthesia Physical Anesthesia Plan  ASA: 4  Anesthesia Plan: MAC   Post-op Pain Management:    Induction: Intravenous  PONV Risk Score and Plan: 2 and Treatment may vary due to age or medical condition  Airway Management Planned:   Additional Equipment: None  Intra-op Plan:   Post-operative Plan:   Informed Consent:    Patient has DNR.     Plan Discussed with: CRNA  Anesthesia Plan Comments:          Anesthesia Quick Evaluation

## 2024-05-04 NOTE — Progress Notes (Signed)
 Navigator following to assist with out-pt HD referal once d/c plan confirmed. Will assist as needed.   Randine Mungo Dialysis Navigator [phone]

## 2024-05-04 NOTE — Anesthesia Postprocedure Evaluation (Signed)
 Anesthesia Post Note  Patient: Kelly Dunn  Procedure(s) Performed: RIGHT INSERTION OF TUNNELED DIALYSIS CATHETER (Right: Chest)     Patient location during evaluation: PACU Anesthesia Type: MAC Level of consciousness: awake Pain management: pain level controlled Vital Signs Assessment: post-procedure vital signs reviewed and stable Respiratory status: spontaneous breathing Cardiovascular status: blood pressure returned to baseline Postop Assessment: no apparent nausea or vomiting Anesthetic complications: no   No notable events documented.            Lauraine DASEN Colhoun

## 2024-05-04 NOTE — Progress Notes (Signed)
.  [facility] Progress Note   Assessment/ Plan:   # CKD stage V with progression to ESRD  - With uremic symptoms/AMS.  HD started on 12/6 via his existing LUE AVF - Plan for HD on 12/8-- infiltration - VVS with fgram 12/8, greatly appreciate - HD 12/9, next 12/11--> not successful - will ask V[facility] for [facility]- may need to just get through and work more extensively with OP cannulation team   # AMS - sundowning vs gaba toxicity vs uremic - getting better   # Hyperkalemia  - mild and improved with HD - continue renal diabetic diet      # HTN  - he didn't get amlodipine  on 12/6. Retimed this for evening dosing so that he doesn't end up missing on HD days   # Anemia of CKD  - last retacrit  10,000 units 04/24/24 - follow and dose when appropriate   # Metabolic bone disease - calcitriol  with dialysis  Subjective:    Wasn't able to cannulate today- has a good T/B still   Objective:   BP 108/77   Pulse 90   Temp 97.6 F (36.4 C)   Resp 16   Ht 6' (1.829 m)   Wt 96 kg   SpO2 99%   BMI 28.70 kg/m   Physical Exam: Hzw:noizm, ill appearing CVS:RRR Resp:clear Jai:dnqu Ext:2+ LE edema, healed skin tears ACCESS: L AVF + T/B  Labs: BMET Recent Labs  Lab 04/28/24 0439 04/28/24 0445 04/28/24 1200 04/29/24 0223 04/30/24 0534 05/01/24 0352 05/01/24 0755 05/02/24 0755  NA 145 145 145 142 141 145 144 142  K 5.2* 5.2* 4.3 4.5 4.0 4.1 4.2 4.2  CL 105 109 115* 112* 106 110 107 106  CO2 21*  --  20* 22 27 24 25  16*  GLUCOSE 277* 262* 84 125* 79 95 89 103*  BUN 61* 58* 63* 66* 42* 53* 53* 60*  CREATININE 6.62* 6.60* 6.32* 6.46* 5.03* 5.80* 5.85* 6.31*  CALCIUM  10.0  --  8.9 9.0 8.7* 8.8* 9.0 8.9  PHOS 4.5  --   --  3.9  --   --  3.9 3.8   CBC Recent Labs  Lab 04/28/24 0439 04/28/24 0445 04/28/24 2348 05/01/24 0755 05/02/24 0755  WBC 4.8  --  5.0 4.8 4.6  NEUTROABS 3.5  --  3.8  --  2.8  HGB 10.6* 12.2* 9.0* 8.6* 8.1*  HCT 34.1* 36.0* 28.2* 27.3*  26.4*  MCV 100.9*  --  99.3 99.6 101.9*  PLT 113*  --  115* 107* 100*      Medications:     allopurinol   100 mg Oral Daily   amLODipine   10 mg Oral QPM   aspirin  EC  81 mg Oral Daily   atorvastatin   40 mg Oral Daily   calcitRIOL   0.25 mcg Oral Q M,W,F   carvedilol   25 mg Oral BID WC   Chlorhexidine  Gluconate Cloth  6 each Topical Q0600   Chlorhexidine  Gluconate Cloth  6 each Topical Q0600   ferrous sulfate   325 mg Oral Daily   heparin   5,000 Units Subcutaneous Q8H   insulin  aspart  0-6 Units Subcutaneous Q4H   multivitamin with minerals  1 tablet Oral Daily   sertraline   25 mg Oral Daily   sodium chloride  flush  3 mL Intravenous Q12H     Almarie Bonine, MD 05/04/2024, 10:57 AM

## 2024-05-04 NOTE — Transfer of Care (Signed)
 Immediate Anesthesia Transfer of Care Note  Patient: Kelly Dunn  Procedure(s) Performed: RIGHT INSERTION OF TUNNELED DIALYSIS CATHETER (Right: Chest)  Patient Location: PACU  Anesthesia Type:MAC  Level of Consciousness: awake, alert , and oriented  Airway & Oxygen Therapy: Patient Spontanous Breathing  Post-op Assessment: Report given to RN and Post -op Vital signs reviewed and stable  Post vital signs: Reviewed and stable  Last Vitals:  Vitals Value Taken Time  BP 159/67 05/04/24 14:35  Temp    Pulse 75 05/04/24 14:40  Resp 17 05/04/24 14:40  SpO2 98 % 05/04/24 14:40  Vitals shown include unfiled device data.  Last Pain:  Vitals:   05/04/24 1256  TempSrc:   PainSc: 0-No pain         Complications: No notable events documented.

## 2024-05-04 NOTE — Plan of Care (Signed)

## 2024-05-05 ENCOUNTER — Encounter (HOSPITAL_COMMUNITY): Payer: Self-pay | Admitting: Vascular Surgery

## 2024-05-05 LAB — GLUCOSE, CAPILLARY
Glucose-Capillary: 113 mg/dL — ABNORMAL HIGH (ref 70–99)
Glucose-Capillary: 127 mg/dL — ABNORMAL HIGH (ref 70–99)
Glucose-Capillary: 144 mg/dL — ABNORMAL HIGH (ref 70–99)
Glucose-Capillary: 93 mg/dL (ref 70–99)

## 2024-05-05 MED ORDER — HEPARIN SODIUM (PORCINE) 1000 UNIT/ML IJ SOLN
3200.0000 [IU] | Freq: Once | INTRAMUSCULAR | Status: AC
  Administered 2024-05-05: 3200 [IU]

## 2024-05-05 MED ORDER — HEPARIN SODIUM (PORCINE) 1000 UNIT/ML IJ SOLN
INTRAMUSCULAR | Status: AC
  Filled 2024-05-05: qty 4

## 2024-05-05 MED ORDER — CALCITRIOL 0.25 MCG PO CAPS
ORAL_CAPSULE | ORAL | Status: AC
  Filled 2024-05-05: qty 1

## 2024-05-05 NOTE — Procedures (Signed)
 Patient seen and examined on Hemodialysis. The procedure was supervised and I have made appropriate changes. BP (!) 129/55   Pulse 63   Temp 98 F (36.7 C)   Resp 19   Ht 6' (1.829 m)   Wt 97.1 kg   SpO2 100%   BMI 29.03 kg/m   QB 300 mL min via TDC, UF goal 2L  Tolerating treatment without complaints at this time.   Almarie Bonine MD [facility] [phone] 11:12 AM

## 2024-05-05 NOTE — Progress Notes (Signed)
 Physical Therapy Treatment Patient Details Name: Kelly Dunn MRN: [id number] DOB: 09/13/1945 Today's Date: 05/05/2024   History of Present Illness 78 y.o. male admitted 12/5 with sepsis. CT (negative). Pt underwent US  and fluoroscopic guided right IJ tunneled dialysis catheter placement 12/11. PMHx: DM2, HTN, HLD, CKD V, L fistula in April 2025.    PT Comments  Pt greeted supine in bed, pleasant and agreeable to PT session. He increased gait distance ambulating ~153ft twice using RW with CGA. As pt fatigued, he became more unsteady, but did not have a LOB this date. Cued PLB technique throughout with pt taking one standing rest break. He reported a 7/10 on the modified RPE scale following gait. Pt performed BLE exercises including LAQs in sitting and quad set + SLR in supine. He reported an increased in fatigue progressing to a 8/10 on the modified RPE scale. Will continue to follow acutely and advance appropriately.      If plan is discharge home, recommend the following: A little help with walking and/or transfers;A little help with bathing/dressing/bathroom;Assistance with cooking/housework;Assist for transportation;Help with stairs or ramp for entrance   Can travel by private vehicle     Yes  Equipment Recommendations  Rolling walker (2 wheels);BSC/3in1    Recommendations for Other Services       Precautions / Restrictions Precautions Precautions: Fall Recall of Precautions/Restrictions: Intact Restrictions Weight Bearing Restrictions Per Provider Order: No     Mobility  Bed Mobility Overal bed mobility: Needs Assistance Bed Mobility: Supine to Sit, Sit to Supine     Supine to sit: Contact guard, HOB elevated, Used rails Sit to supine: Min assist   General bed mobility comments: Pt sat up on R side of bed with increased time. He brought BLE off EOB. Assist to elevate trunk and scoot hips fwd. Returning to bed light assist to bring LE back in. Repositioned in center  of bed. Placed bed into chair position, d/t absence of recliner chair in room.    Transfers Overall transfer level: Needs assistance Equipment used: Rolling walker (2 wheels) Transfers: Sit to/from Stand Sit to Stand: Contact guard assist           General transfer comment: Pt stood from lowest bed height. Cued proper hand placement using RW. Powered up with CGA. Fair eccentric control. Cues to reach back for bed.    Ambulation/Gait Ambulation/Gait assistance: Contact guard assist Gait Distance (Feet): 100 Feet (x2, standing rest break) Assistive device: Rolling walker (2 wheels) Gait Pattern/deviations: Step-through pattern, Decreased stride length Gait velocity: decreased Gait velocity interpretation: <1.31 ft/sec, indicative of household ambulator   General Gait Details: Pt ambulated with a reciprocal gait pattern, even weight shift, and good foot clearence. He maintained upright posture with good proximity to RW. Pt navigated room/hallway well. No LOB. Increased unsteadiness as pt fatigue. Cued PLB technique. Pt took standing rest break.   Stairs             Wheelchair Mobility     Tilt Bed    Modified Rankin (Stroke Patients Only)       Balance Overall balance assessment: Needs assistance Sitting-balance support: Single extremity supported, Feet supported Sitting balance-Leahy Scale: Fair Sitting balance - Comments: Pt sat EOB with close supervision. During LAQs, he demonstrated a posterior lean. Able to correct with VC/TC.   Standing balance support: Bilateral upper extremity supported, During functional activity, Reliant on assistive device for balance Standing balance-Leahy Scale: Poor Standing balance comment: Pt dependent on RW  Communication Communication Communication: Impaired Factors Affecting Communication: Reduced clarity of speech  Cognition Arousal: Alert Behavior During Therapy: WFL for tasks  assessed/performed   PT - Cognitive impairments: No apparent impairments                       PT - Cognition Comments: Pt A,Ox4 Following commands: Intact      Cueing Cueing Techniques: Verbal cues, Gestural cues  Exercises General Exercises - Lower Extremity Quad Sets: Supine, AROM, Both, 10 reps (Hold for 5 seconds.) Long Arc Quad: Seated, Both, AROM, 10 reps (Hold for 3 seconds.) Straight Leg Raises: Supine, Both, AROM, 10 reps Other Exercises Other Exercises: Supine calf stretch with PT pushing up on pt's foot in DF. 3x30sec bilaterally.    General Comments General comments (skin integrity, edema, etc.): VSS on RA. Pt reported a 7/10 on the modified RPE scale increasing to an 8/10 after BLE exercises.      Pertinent Vitals/Pain Pain Assessment Pain Assessment: Faces Faces Pain Scale: Hurts little more Pain Location: Bilat posterior calf Pain Descriptors / Indicators: Cramping Pain Intervention(s): Monitored during session, Limited activity within patient's tolerance, Repositioned    Home Living                          Prior Function            PT Goals (current goals can now be found in the care plan section) Acute Rehab PT Goals Patient Stated Goal: Get better PT Goal Formulation: With patient Time For Goal Achievement: 05/14/24 Potential to Achieve Goals: Good Progress towards PT goals: Progressing toward goals    Frequency    Min 2X/week      PT Plan      Co-evaluation              AM-PAC PT 6 Clicks Mobility   Outcome Measure  Help needed turning from your back to your side while in a flat bed without using bedrails?: A Little Help needed moving from lying on your back to sitting on the side of a flat bed without using bedrails?: A Little Help needed moving to and from a bed to a chair (including a wheelchair)?: A Little Help needed standing up from a chair using your arms (e.g., wheelchair or bedside chair)?: A  Little Help needed to walk in hospital room?: A Little Help needed climbing 3-5 steps with a railing? : A Lot 6 Click Score: 17    End of Session Equipment Utilized During Treatment: Gait belt Activity Tolerance: Patient tolerated treatment well Patient left: in bed;with call bell/phone within reach;with bed alarm set Nurse Communication: Mobility status PT Visit Diagnosis: Unsteadiness on feet (R26.81);Other abnormalities of gait and mobility (R26.89);Muscle weakness (generalized) (M62.81)     Time: 8395-8375 PT Time Calculation (min) (ACUTE ONLY): 20 min  Charges:    $Therapeutic Activity: 8-22 mins PT General Charges $$ ACUTE PT VISIT: 1 Visit                     Randall SAUNDERS, PT, DPT Acute Rehabilitation Services Office: [phone] Secure Chat Preferred  Delon CHRISTELLA Callander 05/05/2024, 4:40 PM

## 2024-05-05 NOTE — Progress Notes (Signed)
° °  Palliative Medicine Inpatient Follow Up Note HPI: 78 y.o. male  with past medical history of HTN, DM2, HLD, CKD 5, with left AV fistula placement in April 2025 not on HD yet--- presented with a progressive agitation, confusion.  He was admitted on 04/28/2024 with altered mental status, hyperkalemia with advanced kidney disease, SIRS, rule out sepsis, CKD 5, and others.    Palliative medicine was consulted for GOC conversations.  Today's Discussion 05/05/2024  *Please note that this is a verbal dictation therefore any spelling or grammatical errors are due to the Dragon Medical One system interpretation.  I reviewed the chart notes including nursing notes, progress notes. I also reviewed vital signs, nursing flowsheets, medication administrations record, labs and imaging.  Oral Intake %:  80% I/O:  (-)1600 Bowel Movements:  Last  12/10 Mobility: Able to mobilize with min assistance and walker  I met with Kelly Dunn this afternoon. He is awake and alert he shares he had his catheter placed to continue dialysis. He overall feels much improved and denies pain, SOB, or nausea.   Plan at this time will be to continue current care.   Questions and concerns addressed/Palliative Support Provided.   Objective Assessment: Vital Signs Vitals:   05/05/24 1253 05/05/24 1524  BP: (!) 151/51 127/60  Pulse: 65 70  Resp:  17  Temp: 98.5 F (36.9 C) 98.6 F (37 C)  SpO2: 99% 99%    Intake/Output Summary (Last 24 hours) at 05/05/2024 1608 Last data filed at 05/05/2024 1225 Gross per 24 hour  Intake --  Output 1600 ml  Net -1600 ml   Last Weight  Most recent update: 05/05/2024 11:23 AM    Weight  95.7 kg (210 lb 15.7 oz)            Gen:  Elderly AA M chronically ill appearing HEENT: moist mucous membranes CV: Regular rate and rhythm  PULM:  On RA, breathing is even and nonlabored ABD: soft/nontender  EXT: Pedal edema  Neuro: Alert and oriented x2  SUMMARY OF RECOMMENDATIONS    DNR/DNI --> Continue full scope of care otherwise inclusive of iHD  Had been started on HD as of 12/6  Continue to allow time for outcomes  Implement strict delirium precautions  Have spoken about Kelly Dunn with Dr. Arlice we will sign off at this time ______________________________________________________________________________________ Rosaline Becton [facility] Palliative Medicine Team Team Cell Phone: [phone] Please utilize secure chat with additional questions, if there is no response within 30 minutes please call the above phone number  Billing based on MDM: Low  Palliative Medicine Team providers are available by phone from 7am to 7pm daily and can be reached through the team cell phone.  Should this patient require assistance outside of these hours, please call the patient's attending physician.

## 2024-05-05 NOTE — Progress Notes (Signed)
.  [facility] Progress Note   Assessment/ Plan:   # CKD stage V with progression to ESRD  - With uremic symptoms/AMS.  HD started on 12/6 via his existing LUE AVF - Plan for HD on 12/8-- infiltration - VVS with fgram 12/8, greatly appreciate - HD 12/9, next 12/11--> not successful [facility] 12/11 with VVS, appreciate HD today 12/12, again tomorrow 12/13 to get 3 rx this week Good T/b in the access- I am thinking that maybe just needs to rest for now and to attempt cannulation protocol with cannulation team as OP   # AMS - sundowning vs gaba toxicity vs uremic - getting better   # Hyperkalemia  - mild and improved with HD - continue renal diabetic diet      # HTN  - he didn't get amlodipine  on 12/6. Retimed this for evening dosing so that he doesn't end up missing on HD days   # Anemia of CKD  - last retacrit  10,000 units 04/24/24 - follow and dose when appropriate   # Metabolic bone disease - calcitriol  with dialysis  Subjective:    TDC placed yesterday, appreciate VVS.  Doing well today.  Using [facility] for [facility].     Objective:   BP 134/61   Pulse 63   Temp 98 F (36.7 C)   Resp 19   Ht 6' (1.829 m)   Wt 97.1 kg   SpO2 100%   BMI 29.03 kg/m   Physical Exam: Hzw:noizm, looks better CVS:RRR Resp:clear Jai:dnqu Ext:2+ LE edema, healed skin tears ACCESS: L AVF + T/B  Labs: BMET Recent Labs  Lab 04/28/24 1200 04/29/24 0223 04/30/24 0534 05/01/24 0352 05/01/24 0755 05/02/24 0755 05/04/24 1324  NA 145 142 141 145 144 142 141  K 4.3 4.5 4.0 4.1 4.2 4.2 4.0  CL 115* 112* 106 110 107 106 105  CO2 20* 22 27 24 25  16*  --   GLUCOSE 84 125* 79 95 89 103* 90  BUN 63* 66* 42* 53* 53* 60* 46*  CREATININE 6.32* 6.46* 5.03* 5.80* 5.85* 6.31* 5.70*  CALCIUM  8.9 9.0 8.7* 8.8* 9.0 8.9  --   PHOS  --  3.9  --   --  3.9 3.8  --    CBC Recent Labs  Lab 04/28/24 2348 05/01/24 0755 05/02/24 0755 05/04/24 1324  WBC 5.0 4.8 4.6  --   NEUTROABS 3.8  --  2.8   --   HGB 9.0* 8.6* 8.1* 9.5*  HCT 28.2* 27.3* 26.4* 28.0*  MCV 99.3 99.6 101.9*  --   PLT 115* 107* 100*  --       Medications:     allopurinol   100 mg Oral Daily   amLODipine   10 mg Oral QPM   aspirin  EC  81 mg Oral Daily   atorvastatin   40 mg Oral Daily   calcitRIOL   0.25 mcg Oral Q M,W,F   carvedilol   25 mg Oral BID WC   Chlorhexidine  Gluconate Cloth  6 each Topical Q0600   Chlorhexidine  Gluconate Cloth  6 each Topical Q0600   ferrous sulfate   325 mg Oral Daily   heparin   5,000 Units Subcutaneous Q8H   insulin  aspart  0-6 Units Subcutaneous Q4H   multivitamin with minerals  1 tablet Oral Daily   sertraline   25 mg Oral Daily   sodium chloride  flush  3 mL Intravenous Q12H     Almarie Bonine, MD 05/05/2024, 11:07 AM

## 2024-05-05 NOTE — TOC Progression Note (Signed)
 Transition of Care [facility]) - Progression Note    Patient Details  Name: Kelly Dunn MRN: [id number] Date of Birth: 09/17/1945  Transition of Care [facility]) CM/SW Contact  Almarie CHRISTELLA [place], [name] Phone Number: 05/05/2024, 2:54 PM  Clinical Narrative:   CSW following for disposition. CSW contacted [employer], as they are considering, to discuss referral and answer questions. [place] to review, will update CSW. CSW to follow.    Expected Discharge Plan: Skilled Nursing Facility Barriers to Discharge: Continued Medical Work up, English As A Second Language Teacher, Facility will not accept until restraint criteria met, Requiring sitter/restraints, Waiting for outpatient dialysis               Expected Discharge Plan and Services     Post Acute Care Choice: Skilled Nursing Facility Living arrangements for the past 2 months: Single Family Home                                       Social Drivers of Health (SDOH) Interventions SDOH Screenings   Tobacco Use: Medium Risk (05/04/2024)    Readmission Risk Interventions     No data to display

## 2024-05-05 NOTE — Progress Notes (Signed)
°   05/05/24 1122  Vitals  Temp 98 F (36.7 C)  Pulse Rate 62  Resp 17  BP (!) 148/56  SpO2 100 %  O2 Device Room Air  Weight 95.7 kg  Type of Weight Post-Dialysis  Oxygen Therapy  Patient Activity (if Appropriate) In bed  Post Treatment  Dialyzer Clearance Lightly streaked  Hemodialysis Intake (mL) 0 mL  Liters Processed 52.5  Fluid Removed (mL) 1500 mL  Tolerated HD Treatment Yes

## 2024-05-05 NOTE — Progress Notes (Signed)
 PROGRESS NOTE  Kelly Dunn  DOB: 04-26-1946  PCP: Regino Slater, MD FMW:[id number]  DOA: 04/28/2024  LOS: 7 days  Hospital Day: 8  Subjective: Patient was seen and examined this morning. Was in dialysis.  Somnolent.  Open eyes to command.  Not in distress.  No new symptoms. Afebrile, heart rate in 60s, blood pressure in 130s and 140s, breathing on room air.   Brief narrative: Kelly Dunn is a 78 y.o. male with PMH significant for DM2, HTN, HLD, CKD 5, gout, neuropathy.  Follows up with nephrology as an outpatient, had left AV fistula created in April 2025 not on HD yet 12/5, patient presented from home with a progressive agitation, confusion.  Per family, he had similar episode earlier this year.  He has been progressively getting confused-with delusion, hallucination that someone was in a house.  911 was called, subsequently he was found in the neighbors driveway, confused, agitated, on the ground and was brought to the ED   Upon arrival to ED, he was hypothermic with bradycardia  BMP showed hyperkalemia and elevated creatinine CT head unremarkable  chest x-ray and UA unremarkable.   Admitted to [facility]  Multiple services including nephrology, palliative care and psychiatry were consulted  Patient was initiated on dialysis.  Assessment and plan: Progressive CKD New ESRD Has LUE AVF.  Started on dialysis on 12/6 due to uremic symptoms. 12/8, patient required balloon angioplasty and stenting of the cephalic arc of the fistula by vascular surgery. 12/9, able to cannulate the fistula and complete dialysis 12/11, had complications with AV fistula again.  Vascular surgery placed a tunneled dialysis catheter.   Outpatient dialysis arrangement in process.  Acute metabolic encephalopathy Likely secondary to uremia in the setting of advanced CKD.  Patient was also on gabapentin which has now been stopped Seen by psychiatry as well. Initially required chemical and physical  restraints. Mental status gradually improved with subsequent dialysis. Still remains intermittently confused but not agitated or violent Currently continued on Zoloft  25 mg daily, Haldol  2 mg IV every 6 hours PRN for agitation EKG 12/7 QTc 420 ms. Patient needs physical restraints, nursing staff have been clearly instructed to avoid putting tight restraints on the fistula arm.  SIRS Was hypothermic and bradycardic on admission Likely because he was out on the cold.  Was found on neighbor's driveway confused. No evidence of infection. Blood culture sent on admission did not show any growth. Empiric antibiotics have been discontinued. Recent Labs  Lab 04/28/24 1200 04/28/24 2348 05/01/24 0755 05/02/24 0755  WBC  --  5.0 4.8 4.6  LATICACIDVEN 1.3  --   --   --    Hyperkalemia Potassium level improved with dialysis Recent Labs  Lab 04/29/24 0223 04/30/24 0534 05/01/24 0352 05/01/24 0755 05/02/24 0755 05/04/24 1324  K 4.5 4.0 4.1 4.2 4.2 4.0  PHOS 3.9  --   --  3.9 3.8  --    Essential hypertension Blood pressure controlled, continue current management. PTA meds- Coreg  25 mg twice daily amlodipine  10 mg daily, Lasix  twice daily Currently continued on Coreg  and amlodipine   Type 2 diabetes mellitus Hypoglycemia A1c 4.3 on 04/28/24 PTA meds- Lantus  Was hypoglycemic on 12/6.  Lantus  was discontinued Currently blood sugar is in acceptable range only on SSI/Accu-Cheks. Insulin  requirement may increase since he has been started on dialysis.  Continue to monitor Recent Labs  Lab 05/04/24 1604 05/04/24 1923 05/04/24 2258 05/05/24 0012 05/05/24 0317  GLUCAP 99 151* 74 113* 93   HLD Lipitor  40 mg daily  Chronic macrocytic anemia Hemoglobin at baseline between 9 and 10.  Currently mostly between 8 and 9.  No active bleeding.  Continue to monitor. Anemia panel showed ferritin level of 293. Recent Labs    12/31/23 0931 12/31/23 9062 01/28/24 1239 01/28/24 1241  02/25/24 1228 02/25/24 1236 04/07/24 1027 04/07/24 1031 04/28/24 0445 04/28/24 2348 04/30/24 1019 05/01/24 0755 05/02/24 0755 05/04/24 1324  HGB  --    < >  --    < >  --    < >  --    < > 12.2* 9.0*  --  8.6* 8.1* 9.5*  MCV  --   --   --   --   --   --   --    < >  --  99.3  --  99.6 101.9*  --   VITAMINB12  --   --   --   --   --   --   --   --   --   --  234  --   --   --   FOLATE  --   --   --   --   --   --   --   --   --   --  5.1*  --   --   --   FERRITIN 128  --  262  --  282  --  237  --   --   --  293  --   --   --   TIBC 204*  --  211*  --  197*  --  195*  --   --   --  161*  --   --   --   IRON 70  --  72  --  85  --  65  --   --   --  61  --   --   --   RETICCTPCT  --   --   --   --   --   --   --   --   --   --  1.3  --   --   --    < > = values in this interval not displayed.   H/o gout Continue allopurinol .  Diabetic neuropathy Gabapentin on hold because of altered mentation.  Nephrology recommends against restarting it.  Overweight Body mass index is 29.03 kg/m. Patient has been advised to make an attempt to improve diet and exercise patterns to aid in weight loss.  Impaired mobility:  Seen by PT PT Follow up Rec: Skilled Nursing-Short Term Rehab (<3 Hours/Day)05/03/2024 1200    Goals of care   Code Status: Limited: Do not attempt resuscitation (DNR) -DNR-LIMITED -Do Not Intubate/DNI      DVT prophylaxis:  heparin  injection 5,000 Units Start: 04/28/24 0815 SCDs Start: 04/28/24 0759   Antimicrobials: None currently Fluid: None Consultants: Nephrology Family Communication: None at bedside  Status: Inpatient Level of care:  Med-Surg   Patient is from: Home Needs to continue in-hospital care: newly started on dialysis.  Outpatient chair arrangement in process    Diet:  Diet Order             Diet renal with fluid restriction Fluid restriction: 1200 mL Fluid; Room service appropriate? Yes; Fluid consistency: Thin  Diet effective now                    Scheduled Meds:  allopurinol   100  mg Oral Daily   amLODipine   10 mg Oral QPM   aspirin  EC  81 mg Oral Daily   atorvastatin   40 mg Oral Daily   calcitRIOL   0.25 mcg Oral Q M,W,F   carvedilol   25 mg Oral BID WC   Chlorhexidine  Gluconate Cloth  6 each Topical Q0600   Chlorhexidine  Gluconate Cloth  6 each Topical Q0600   ferrous sulfate   325 mg Oral Daily   heparin   5,000 Units Subcutaneous Q8H   insulin  aspart  0-6 Units Subcutaneous Q4H   multivitamin with minerals  1 tablet Oral Daily   sertraline   25 mg Oral Daily   sodium chloride  flush  3 mL Intravenous Q12H    PRN meds: acetaminophen  **OR** acetaminophen , bisacodyl , haloperidol  lactate, hydrALAZINE , HYDROmorphone  (DILAUDID ) injection, ipratropium, ondansetron  **OR** ondansetron  (ZOFRAN ) IV, oxyCODONE , senna-docusate   Infusions:     Antimicrobials: Anti-infectives (From admission, onward)    Start     Dose/Rate Route Frequency Ordered Stop   04/29/24 0900  ceFEPIme  (MAXIPIME ) 2 g in sodium chloride  0.9 % 100 mL IVPB  Status:  Discontinued        2 g 200 mL/hr over 30 Minutes Intravenous Every 24 hours 04/28/24 0851 04/29/24 0847   04/28/24 0851  vancomycin  variable dose per unstable renal function (pharmacist dosing)  Status:  Discontinued         Does not apply See admin instructions 04/28/24 0851 04/29/24 0847   04/28/24 0830  ceFEPIme  (MAXIPIME ) 2 g in sodium chloride  0.9 % 100 mL IVPB        2 g 200 mL/hr over 30 Minutes Intravenous  Once 04/28/24 0820 04/28/24 0955   04/28/24 0830  metroNIDAZOLE  (FLAGYL ) IVPB 500 mg  Status:  Discontinued        500 mg 100 mL/hr over 60 Minutes Intravenous Every 12 hours 04/28/24 0820 04/29/24 0847   04/28/24 0830  vancomycin  (VANCOREADY) IVPB 2000 mg/400 mL        2,000 mg 200 mL/hr over 120 Minutes Intravenous  Once 04/28/24 0820 04/28/24 1208       Objective: Vitals:   05/05/24 0900 05/05/24 0930  BP: (!) 140/56 (!) 137/54  Pulse: 66 63  Resp: 17 20   Temp:    SpO2: 100% 99%    Intake/Output Summary (Last 24 hours) at 05/05/2024 0935 Last data filed at 05/04/2024 1426 Gross per 24 hour  Intake 200 ml  Output 155 ml  Net 45 ml   Filed Weights   05/04/24 0751 05/04/24 1238 05/05/24 0731  Weight: 96 kg 96 kg 97.1 kg   Weight change:  Body mass index is 29.03 kg/m.   Physical Exam: General exam: Pleasant, elderly African-American male.  Not in distress Skin: No rashes, lesions or ulcers. HEENT: Atraumatic, normocephalic, no obvious bleeding Lungs: Clear to auscultation bilaterally,  CVS: S1, S2, no murmur,   GI/Abd: Soft, nontender, nondistended, bowel sound present,   CNS: Alert, awake, oriented to place and person.  Slow to respond Psychiatry: Mood appropriate Extremities: No pedal edema, no calf tenderness,   Data Review: I have personally reviewed the laboratory data and studies available.  F/u labs ordered Unresulted Labs (From admission, onward)    None       Signed, Chapman Rota, MD [facility] 05/05/2024

## 2024-05-05 NOTE — Progress Notes (Signed)
 Came in awake and oriented with no complaints. Consent verified and on file. Pt. Started with no complaints  Access used: Right CVC Access issue: None  Catheter was placed yesterday. Changed dressing.  Dressing due change on 05/12/2024  Tx duration: 3.5 hours UF removal:1543ml  Seen by Almarie Gearline COME  Tx completed and tolerated. Catheter locked and dwelled with heparin . Endorsed to floor nurse. Transported to room.  Riely Baskett Rubi Willa Brocks, RN [facility]

## 2024-05-06 LAB — RENAL FUNCTION PANEL
Albumin: 2.9 g/dL — ABNORMAL LOW (ref 3.5–5.0)
Anion gap: 7 (ref 5–15)
BUN: 31 mg/dL — ABNORMAL HIGH (ref 8–23)
CO2: 29 mmol/L (ref 22–32)
Calcium: 8.6 mg/dL — ABNORMAL LOW (ref 8.9–10.3)
Chloride: 104 mmol/L (ref 98–111)
Creatinine, Ser: 4.59 mg/dL — ABNORMAL HIGH (ref 0.61–1.24)
GFR, Estimated: 13 mL/min — ABNORMAL LOW (ref >60)
Glucose, Bld: 86 mg/dL (ref 70–99)
Phosphorus: 3.2 mg/dL (ref 2.5–4.6)
Potassium: 4.2 mmol/L (ref 3.5–5.1)
Sodium: 140 mmol/L (ref 135–145)

## 2024-05-06 LAB — CBC
HCT: 24.7 % — ABNORMAL LOW (ref 39.0–52.0)
Hemoglobin: 7.8 g/dL — ABNORMAL LOW (ref 13.0–17.0)
MCH: 31.6 pg (ref 26.0–34.0)
MCHC: 31.6 g/dL (ref 30.0–36.0)
MCV: 100 fL (ref 80.0–100.0)
Platelets: 86 K/uL — ABNORMAL LOW (ref 150–400)
RBC: 2.47 MIL/uL — ABNORMAL LOW (ref 4.22–5.81)
RDW: 14 % (ref 11.5–15.5)
WBC: 3 K/uL — ABNORMAL LOW (ref 4.0–10.5)
nRBC: 0 % (ref 0.0–0.2)

## 2024-05-06 LAB — GLUCOSE, CAPILLARY
Glucose-Capillary: 78 mg/dL (ref 70–99)
Glucose-Capillary: 87 mg/dL (ref 70–99)
Glucose-Capillary: 117 mg/dL — ABNORMAL HIGH (ref 70–99)

## 2024-05-06 MED ORDER — DARBEPOETIN ALFA 100 MCG/0.5ML IJ SOSY
100.0000 ug | PREFILLED_SYRINGE | INTRAMUSCULAR | Status: AC
  Administered 2024-05-06: 100 ug via SUBCUTANEOUS
  Filled 2024-05-06: qty 0.5

## 2024-05-06 MED ORDER — INSULIN ASPART 100 UNIT/ML IJ SOLN: 0.0000 [IU] | Freq: Every day | INTRAMUSCULAR | Status: DC

## 2024-05-06 MED ORDER — INSULIN ASPART 100 UNIT/ML IJ SOLN
0.0000 [IU] | Freq: Three times a day (TID) | INTRAMUSCULAR | Status: DC
  Administered 2024-05-08 – 2024-05-11 (×2): 1 [IU] via SUBCUTANEOUS
  Filled 2024-05-06 (×2): qty 1

## 2024-05-06 MED ORDER — HEPARIN SODIUM (PORCINE) 1000 UNIT/ML IJ SOLN
INTRAMUSCULAR | Status: AC
  Filled 2024-05-06: qty 4

## 2024-05-06 NOTE — Progress Notes (Signed)
 PROGRESS NOTE  Kadien Lineman  DOB: 10-11-1945  PCP: Regino Slater, MD FMW:[id number]  DOA: 04/28/2024  LOS: 8 days  Hospital Day: 9  Subjective: Patient was seen and examined this morning. Lying down on bed.  Not in distress.  No new symptoms. Afebrile, hemodynamically stable, breathing on room air. Blood glucose in target range Pending placement  Brief narrative: Duwane Gewirtz is a 78 y.o. male with PMH significant for DM2, HTN, HLD, CKD 5, gout, neuropathy.  Follows up with nephrology as an outpatient, had left AV fistula created in April 2025 not on HD yet 12/5, patient presented from home with a progressive agitation, confusion.  Per family, he had similar episode earlier this year.  He has been progressively getting confused-with delusion, hallucination that someone was in a house.  911 was called, subsequently he was found in the neighbors driveway, confused, agitated, on the ground and was brought to the ED   Upon arrival to ED, he was hypothermic with bradycardia  BMP showed hyperkalemia and elevated creatinine CT head unremarkable  chest x-ray and UA unremarkable.   Admitted to [facility]  Multiple services including nephrology, palliative care and psychiatry were consulted  Patient was initiated on dialysis.  Assessment and plan: Progressive CKD New ESRD Has LUE AVF.  Started on dialysis on 12/6 due to uremic symptoms. 12/8, patient required balloon angioplasty and stenting of the cephalic arc of the fistula by vascular surgery. 12/9, able to cannulate the fistula and complete dialysis 12/11, had complications with AV fistula again.  Vascular surgery placed a tunneled dialysis catheter. 12/12, underwent successful dialysis Outpatient dialysis arrangement in process.  Acute metabolic encephalopathy Likely secondary to uremia in the setting of advanced CKD.  Patient was also on gabapentin which has now been stopped Seen by psychiatry as well. Initially required  chemical and physical restraints. Mental status gradually improved with subsequent dialysis. Currently continued on Zoloft  25 mg daily, Haldol  2 mg IV every 6 hours PRN for agitation EKG 12/7 QTc 420 ms. Patient needs physical restraints, nursing staff have been clearly instructed to avoid putting tight restraints on the fistula arm.  SIRS Was hypothermic and bradycardic on admission Likely because he was out on the cold.  Was found on neighbor's driveway confused. No evidence of infection. Blood culture sent on admission did not show any growth. Empiric antibiotics have been discontinued. Recent Labs  Lab 05/01/24 0755 05/02/24 0755  WBC 4.8 4.6   Hyperkalemia Potassium level improved with dialysis Recent Labs  Lab 04/30/24 0534 05/01/24 0352 05/01/24 0755 05/02/24 0755 05/04/24 1324  K 4.0 4.1 4.2 4.2 4.0  PHOS  --   --  3.9 3.8  --    Essential hypertension Blood pressure controlled, continue current management. PTA meds- Coreg  25 mg twice daily amlodipine  10 mg daily, Lasix  twice daily Currently continued on Coreg  and amlodipine   Type 2 diabetes mellitus Hypoglycemia A1c 4.3 on 04/28/24 PTA meds- Lantus  Was hypoglycemic on 12/6.  Lantus  was discontinued Currently blood sugar is in acceptable range only on SSI/Accu-Cheks. Recent Labs  Lab 05/05/24 0317 05/05/24 1523 05/05/24 2034 05/06/24 0242 05/06/24 0628  GLUCAP 93 127* 144* 78 87   HLD Lipitor 40 mg daily  Chronic macrocytic anemia Hemoglobin at baseline between 9 and 10.  Currently mostly between 8 and 9.  No active bleeding.  Continue to monitor. Anemia panel showed ferritin level of 293. Recent Labs    12/31/23 0931 12/31/23 9062 01/28/24 1239 01/28/24 1241 02/25/24 1228 02/25/24 1236 04/07/24  1027 04/07/24 1031 04/28/24 0445 04/28/24 2348 04/30/24 1019 05/01/24 0755 05/02/24 0755 05/04/24 1324  HGB  --    < >  --    < >  --    < >  --    < > 12.2* 9.0*  --  8.6* 8.1* 9.5*  MCV  --    --   --   --   --   --   --    < >  --  99.3  --  99.6 101.9*  --   VITAMINB12  --   --   --   --   --   --   --   --   --   --  234  --   --   --   FOLATE  --   --   --   --   --   --   --   --   --   --  5.1*  --   --   --   FERRITIN 128  --  262  --  282  --  237  --   --   --  293  --   --   --   TIBC 204*  --  211*  --  197*  --  195*  --   --   --  161*  --   --   --   IRON 70  --  72  --  85  --  65  --   --   --  61  --   --   --   RETICCTPCT  --   --   --   --   --   --   --   --   --   --  1.3  --   --   --    < > = values in this interval not displayed.   H/o gout Continue allopurinol .  Diabetic neuropathy Gabapentin on hold because of altered mentation.  Nephrology recommends against restarting it.  Overweight Body mass index is 28.61 kg/m. Patient has been advised to make an attempt to improve diet and exercise patterns to aid in weight loss.  Impaired mobility:  Seen by PT PT Follow up Rec: Skilled Nursing-Short Term Rehab (<3 Hours/Day)05/05/2024 1600    Goals of care   Code Status: Limited: Do not attempt resuscitation (DNR) -DNR-LIMITED -Do Not Intubate/DNI      DVT prophylaxis:  heparin  injection 5,000 Units Start: 04/28/24 0815 SCDs Start: 04/28/24 0759   Antimicrobials: None currently Fluid: None Consultants: Nephrology Family Communication: None at bedside  Status: Inpatient Level of care:  Med-Surg   Patient is from: Home Needs to continue in-hospital care: newly started on dialysis.  Outpatient chair arrangement in process    Diet:  Diet Order             Diet renal with fluid restriction Fluid restriction: 1200 mL Fluid; Room service appropriate? Yes; Fluid consistency: Thin  Diet effective now                   Scheduled Meds:  allopurinol   100 mg Oral Daily   amLODipine   10 mg Oral QPM   aspirin  EC  81 mg Oral Daily   atorvastatin   40 mg Oral Daily   calcitRIOL   0.25 mcg Oral Q M,W,F   carvedilol   25 mg Oral BID WC    Chlorhexidine  Gluconate Cloth  6 each  Topical Q0600   Chlorhexidine  Gluconate Cloth  6 each Topical Q0600   darbepoetin (ARANESP ) injection - DIALYSIS  100 mcg Subcutaneous Q Sat-1800   ferrous sulfate   325 mg Oral Daily   heparin   5,000 Units Subcutaneous Q8H   insulin  aspart  0-6 Units Subcutaneous Q4H   multivitamin with minerals  1 tablet Oral Daily   sertraline   25 mg Oral Daily   sodium chloride  flush  3 mL Intravenous Q12H    PRN meds: acetaminophen  **OR** acetaminophen , bisacodyl , haloperidol  lactate, hydrALAZINE , HYDROmorphone  (DILAUDID ) injection, ipratropium, ondansetron  **OR** ondansetron  (ZOFRAN ) IV, oxyCODONE , senna-docusate   Infusions:     Antimicrobials: Anti-infectives (From admission, onward)    Start     Dose/Rate Route Frequency Ordered Stop   04/29/24 0900  ceFEPIme  (MAXIPIME ) 2 g in sodium chloride  0.9 % 100 mL IVPB  Status:  Discontinued        2 g 200 mL/hr over 30 Minutes Intravenous Every 24 hours 04/28/24 0851 04/29/24 0847   04/28/24 0851  vancomycin  variable dose per unstable renal function (pharmacist dosing)  Status:  Discontinued         Does not apply See admin instructions 04/28/24 0851 04/29/24 0847   04/28/24 0830  ceFEPIme  (MAXIPIME ) 2 g in sodium chloride  0.9 % 100 mL IVPB        2 g 200 mL/hr over 30 Minutes Intravenous  Once 04/28/24 0820 04/28/24 0955   04/28/24 0830  metroNIDAZOLE  (FLAGYL ) IVPB 500 mg  Status:  Discontinued        500 mg 100 mL/hr over 60 Minutes Intravenous Every 12 hours 04/28/24 0820 04/29/24 0847   04/28/24 0830  vancomycin  (VANCOREADY) IVPB 2000 mg/400 mL        2,000 mg 200 mL/hr over 120 Minutes Intravenous  Once 04/28/24 0820 04/28/24 1208       Objective: Vitals:   05/06/24 0832 05/06/24 1159  BP: (!) 127/55 (!) 127/48  Pulse: 66 65  Resp: 18 16  Temp: 98 F (36.7 C) 98 F (36.7 C)  SpO2: 96% 97%    Intake/Output Summary (Last 24 hours) at 05/06/2024 1208 Last data filed at 05/05/2024 2000 Gross  per 24 hour  Intake --  Output 425 ml  Net -425 ml   Filed Weights   05/04/24 1238 05/05/24 0731 05/05/24 1122  Weight: 96 kg 97.1 kg 95.7 kg   Weight change: 1.1 kg Body mass index is 28.61 kg/m.   Physical Exam: General exam: Pleasant, elderly African-American male.  Not in distress Skin: No rashes, lesions or ulcers. HEENT: Atraumatic, normocephalic, no obvious bleeding Lungs: Clear to auscultation bilaterally,  CVS: S1, S2, no murmur,   GI/Abd: Soft, nontender, nondistended, bowel sound present,   CNS: Alert, awake, oriented to place and person.  Slow to respond Psychiatry: Mood appropriate Extremities: No pedal edema, no calf tenderness,   Data Review: I have personally reviewed the laboratory data and studies available.  F/u labs ordered Unresulted Labs (From admission, onward)     Start     Ordered   05/06/24 1113  Renal function panel  Once,   R       Question:  Specimen collection method  Answer:  Lab=Lab collect   05/06/24 1112   05/06/24 1113  CBC  Once,   R       Question:  Specimen collection method  Answer:  Lab=Lab collect   05/06/24 1112            Signed, Chapman Bruin Bolger,  MD [facility] 05/06/2024

## 2024-05-06 NOTE — Plan of Care (Signed)
  Problem: Education: Goal: Ability to describe self-care measures that may prevent or decrease complications (Diabetes Survival Skills Education) will improve Outcome: Progressing   Problem: Health Behavior/Discharge Planning: Goal: Ability to manage health-related needs will improve Outcome: Progressing   Problem: Metabolic: Goal: Ability to maintain appropriate glucose levels will improve Outcome: Progressing

## 2024-05-06 NOTE — Progress Notes (Signed)
.  [facility] Progress Note   Assessment/ Plan:   # CKD stage V with progression to ESRD  - With uremic symptoms/AMS.  HD started on 12/6 via his existing LUE AVF - Plan for HD on 12/8-- infiltration - VVS with fgram 12/8, greatly appreciate - HD 12/9, next 12/11--> not successful [facility] 12/11 with VVS, appreciate HD today 12/12, again tomorrow 12/13 to get 3 rx this week Good T/b in the access- I am thinking that maybe just needs to rest for now and to attempt cannulation protocol with cannulation team as OP   # AMS - sundowning vs gaba toxicity vs uremic - getting better   # Hyperkalemia  - mild and improved with HD - continue renal diabetic diet      # HTN  - reasonably well controlled   # Anemia of CKD  - last retacrit  10,000 units 04/24/24 - Aranesp  100 mg today   # Metabolic bone disease - calcitriol  with dialysis  Subjective:    TDC placed yesterday, appreciate VVS.  Doing well today.  Using [facility] for HD.     Objective:   BP (!) 127/55 (BP Location: Right Arm)   Pulse 66   Temp 98 F (36.7 C) (Oral)   Resp 18   Ht 6' (1.829 m)   Wt 95.7 kg   SpO2 96%   BMI 28.61 kg/m   Physical Exam: Hzw:noizm, looks better CVS:RRR Resp:clear Jai:dnqu Ext:2+ LE edema, healed skin tears ACCESS: L AVF + T/B, R [facility]  Labs: BMET Recent Labs  Lab 04/30/24 0534 05/01/24 0352 05/01/24 0755 05/02/24 0755 05/04/24 1324  NA 141 145 144 142 141  K 4.0 4.1 4.2 4.2 4.0  CL 106 110 107 106 105  CO2 27 24 25  16*  --   GLUCOSE 79 95 89 103* 90  BUN 42* 53* 53* 60* 46*  CREATININE 5.03* 5.80* 5.85* 6.31* 5.70*  CALCIUM  8.7* 8.8* 9.0 8.9  --   PHOS  --   --  3.9 3.8  --    CBC Recent Labs  Lab 05/01/24 0755 05/02/24 0755 05/04/24 1324  WBC 4.8 4.6  --   NEUTROABS  --  2.8  --   HGB 8.6* 8.1* 9.5*  HCT 27.3* 26.4* 28.0*  MCV 99.6 101.9*  --   PLT 107* 100*  --       Medications:     allopurinol   100 mg Oral Daily   amLODipine   10 mg Oral QPM    aspirin  EC  81 mg Oral Daily   atorvastatin   40 mg Oral Daily   calcitRIOL   0.25 mcg Oral Q M,W,F   carvedilol   25 mg Oral BID WC   Chlorhexidine  Gluconate Cloth  6 each Topical Q0600   Chlorhexidine  Gluconate Cloth  6 each Topical Q0600   ferrous sulfate   325 mg Oral Daily   heparin   5,000 Units Subcutaneous Q8H   insulin  aspart  0-6 Units Subcutaneous Q4H   multivitamin with minerals  1 tablet Oral Daily   sertraline   25 mg Oral Daily   sodium chloride  flush  3 mL Intravenous Q12H     Almarie Bonine, MD 05/06/2024, 11:18 AM

## 2024-05-06 NOTE — Plan of Care (Signed)
°  Problem: Education: Goal: Ability to describe self-care measures that may prevent or decrease complications (Diabetes Survival Skills Education) will improve Outcome: Progressing Goal: Individualized Educational Video(s) Outcome: Progressing   Problem: Coping: Goal: Ability to adjust to condition or change in health will improve Outcome: Progressing   Problem: Fluid Volume: Goal: Ability to maintain a balanced intake and output will improve Outcome: Progressing   Problem: Health Behavior/Discharge Planning: Goal: Ability to identify and utilize available resources and services will improve Outcome: Progressing Goal: Ability to manage health-related needs will improve Outcome: Progressing   Problem: Metabolic: Goal: Ability to maintain appropriate glucose levels will improve Outcome: Progressing   Problem: Nutritional: Goal: Maintenance of adequate nutrition will improve Outcome: Progressing Goal: Progress toward achieving an optimal weight will improve Outcome: Progressing   Problem: Skin Integrity: Goal: Risk for impaired skin integrity will decrease Outcome: Progressing   Problem: Tissue Perfusion: Goal: Adequacy of tissue perfusion will improve Outcome: Progressing   Problem: Fluid Volume: Goal: Hemodynamic stability will improve Outcome: Progressing   Problem: Clinical Measurements: Goal: Diagnostic test results will improve Outcome: Progressing Goal: Signs and symptoms of infection will decrease Outcome: Progressing   Problem: Respiratory: Goal: Ability to maintain adequate ventilation will improve Outcome: Progressing   Problem: Education: Goal: Knowledge of General Education information will improve Description: Including pain rating scale, medication(s)/side effects and non-pharmacologic comfort measures Outcome: Progressing   Problem: Health Behavior/Discharge Planning: Goal: Ability to manage health-related needs will improve Outcome:  Progressing   Problem: Clinical Measurements: Goal: Ability to maintain clinical measurements within normal limits will improve Outcome: Progressing Goal: Will remain free from infection Outcome: Progressing Goal: Diagnostic test results will improve Outcome: Progressing Goal: Respiratory complications will improve Outcome: Progressing Goal: Cardiovascular complication will be avoided Outcome: Progressing   Problem: Activity: Goal: Risk for activity intolerance will decrease Outcome: Progressing   Problem: Nutrition: Goal: Adequate nutrition will be maintained Outcome: Progressing   Problem: Coping: Goal: Level of anxiety will decrease Outcome: Progressing   Problem: Elimination: Goal: Will not experience complications related to bowel motility Outcome: Progressing Goal: Will not experience complications related to urinary retention Outcome: Progressing   Problem: Pain Managment: Goal: General experience of comfort will improve and/or be controlled Outcome: Progressing   Problem: Safety: Goal: Ability to remain free from injury will improve Outcome: Progressing   Problem: Skin Integrity: Goal: Risk for impaired skin integrity will decrease Outcome: Progressing   Problem: Safety: Goal: Non-violent Restraint(s) Outcome: Progressing   Problem: Education: Goal: Knowledge of disease and its progression will improve Outcome: Progressing Goal: Individualized Educational Video(s) Outcome: Progressing   Problem: Fluid Volume: Goal: Compliance with measures to maintain balanced fluid volume will improve Outcome: Progressing   Problem: Health Behavior/Discharge Planning: Goal: Ability to manage health-related needs will improve Outcome: Progressing   Problem: Nutritional: Goal: Ability to make healthy dietary choices will improve Outcome: Progressing   Problem: Clinical Measurements: Goal: Complications related to the disease process, condition or treatment  will be avoided or minimized Outcome: Progressing

## 2024-05-07 DIAGNOSIS — R4182 Altered mental status, unspecified: Secondary | ICD-10-CM | POA: Diagnosis not present

## 2024-05-07 LAB — GLUCOSE, CAPILLARY
Glucose-Capillary: 79 mg/dL (ref 70–99)
Glucose-Capillary: 81 mg/dL (ref 70–99)
Glucose-Capillary: 77 mg/dL (ref 70–99)
Glucose-Capillary: 116 mg/dL — ABNORMAL HIGH (ref 70–99)
Glucose-Capillary: 134 mg/dL — ABNORMAL HIGH (ref 70–99)
Glucose-Capillary: 92 mg/dL (ref 70–99)

## 2024-05-07 NOTE — Progress Notes (Signed)
 PROGRESS NOTE  Kelly Dunn  DOB: 1945/07/04  PCP: Regino Slater, MD FMW:[id number]  DOA: 04/28/2024  LOS: 9 days  Hospital Day: 10  Subjective: Patient was seen and examined this morning. Lying down in bed.  Not in distress.  No new symptoms.  Afebrile, hemodynamically stable, breathing on room air Blood Kos level consistently less than 100 Pending placement  Brief narrative: Kelly Dunn is a 78 y.o. male with PMH significant for DM2, HTN, HLD, CKD 5, gout, neuropathy.    12/5, patient was brought from home with a progressive agitation, confusion.  Per family, he had similar episode earlier this year.  He has been progressively getting confused-with delusion, hallucination that someone was in a house.  911 was called, subsequently he was found in the neighbors driveway, confused, agitated, on the ground and was brought to the ED   Upon arrival to ED, he was hypothermic with bradycardia  BMP showed hyperkalemia and elevated creatinine CT head unremarkable  Admitted to [facility] for uremic encephalopathy Multiple services including nephrology, palliative care and psychiatry were consulted  From the next day 12/6 patient was initiated on dialysis.  Assessment and plan: Progressive CKD New ESRD Patient had CKD 5 and was following up with nephrology as an outpatient.  He had left AV fistula created in April 2025 but had not been yet initiated on dialysis. 12/5, presented with uremic encephalopathy 12/6, started on dialysis on 12/6  12/8, patient required balloon angioplasty and stenting of the cephalic arc of the fistula by vascular surgery. 12/11, had complications with AV fistula again.  Vascular surgery placed a tunneled dialysis catheter. 12/12, underwent successful dialysis Outpatient dialysis arrangement in process.  Acute metabolic encephalopathy Altered mental status on admission was likely secondary to uremia in the setting of advanced CKD.  Patient was also on  gabapentin which was stopped Seen by psychiatry as well. Initially required chemical and physical restraints. Mental status gradually improved with subsequent dialysis. Currently continued on Zoloft  25 mg daily, Haldol  2 mg IV every 6 hours PRN for agitation EKG 12/7 QTc 420 ms. If patient needs physical restraints, nursing staff have been clearly instructed to avoid putting tight restraints on the fistula arm.  SIRS Was hypothermic and bradycardic on admission Likely because he was out on the cold.  Was found on neighbor's driveway confused. No evidence of infection. Blood culture sent on admission did not show any growth. Empiric antibiotics have been discontinued. Recent Labs  Lab 05/01/24 0755 05/02/24 0755 05/06/24 1137  WBC 4.8 4.6 3.0*   Hyperkalemia Potassium level improved with dialysis Recent Labs  Lab 05/01/24 0352 05/01/24 0755 05/02/24 0755 05/04/24 1324 05/06/24 1137  K 4.1 4.2 4.2 4.0 4.2  PHOS  --  3.9 3.8  --  3.2   Essential hypertension Blood pressure controlled, continue current management. PTA meds- Coreg , amlodipine , Lasix  Currently continued on Coreg  25 mg twice daily and amlodipine  10 mg nightly, IV hydralazine  as needed  Type 2 diabetes mellitus Hypoglycemia A1c 4.3 on 04/28/24 PTA meds- Lantus  Was hypoglycemic on 12/6.  Lantus  was discontinued Currently blood sugar is consistently less than 100.  Continue SSI/Accu-Cheks. Recent Labs  Lab 05/06/24 0628 05/06/24 2032 05/07/24 0626 05/07/24 0754 05/07/24 1200  GLUCAP 87 117* 79 81 77   HLD Lipitor 40 mg daily  Chronic macrocytic anemia Hemoglobin at baseline between 9 and 10.  Currently mostly between 8 and 9.  No active bleeding. Hemoglobin was low at 7.8 on last check on 12/13.  Repeat  again tomorrow Recent Labs    12/31/23 0931 12/31/23 0937 01/28/24 1239 01/28/24 1241 02/25/24 1228 02/25/24 1236 04/07/24 1027 04/07/24 1031 04/28/24 2348 04/30/24 1019 05/01/24 0755  05/02/24 0755 05/04/24 1324 05/06/24 1137  HGB  --    < >  --    < >  --    < >  --    < > 9.0*  --  8.6* 8.1* 9.5* 7.8*  MCV  --   --   --   --   --   --   --    < > 99.3  --  99.6 101.9*  --  100.0  VITAMINB12  --   --   --   --   --   --   --   --   --  234  --   --   --   --   FOLATE  --   --   --   --   --   --   --   --   --  5.1*  --   --   --   --   FERRITIN 128  --  262  --  282  --  237  --   --  293  --   --   --   --   TIBC 204*  --  211*  --  197*  --  195*  --   --  161*  --   --   --   --   IRON 70  --  72  --  85  --  65  --   --  61  --   --   --   --   RETICCTPCT  --   --   --   --   --   --   --   --   --  1.3  --   --   --   --    < > = values in this interval not displayed.   H/o gout Continue allopurinol .  Diabetic neuropathy Gabapentin on hold because of altered mentation.  Nephrology recommended against restarting it.  Overweight Body mass index is 28.17 kg/m. Patient has been advised to make an attempt to improve diet and exercise patterns to aid in weight loss.  Impaired mobility:  Seen by PT PT Follow up Rec: Skilled Nursing-Short Term Rehab (<3 Hours/Day)05/05/2024 1600    Goals of care   Code Status: Limited: Do not attempt resuscitation (DNR) -DNR-LIMITED -Do Not Intubate/DNI      DVT prophylaxis:  heparin  injection 5,000 Units Start: 04/28/24 0815 SCDs Start: 04/28/24 0759   Antimicrobials: None currently Fluid: None Consultants: Nephrology Family Communication: None at bedside  Status: Inpatient Level of care:  Med-Surg   Patient is from: Home Needs to continue in-hospital care: newly started on dialysis.  Outpatient chair arrangement in process    Diet:  Diet Order             Diet renal with fluid restriction Fluid restriction: 1200 mL Fluid; Room service appropriate? Yes; Fluid consistency: Thin  Diet effective now                   Scheduled Meds:  allopurinol   100 mg Oral Daily   amLODipine   10 mg Oral QPM    aspirin  EC  81 mg Oral Daily   atorvastatin   40 mg Oral Daily   calcitRIOL   0.25 mcg Oral Q  M,W,F   carvedilol   25 mg Oral BID WC   Chlorhexidine  Gluconate Cloth  6 each Topical Q0600   Chlorhexidine  Gluconate Cloth  6 each Topical Q0600   darbepoetin (ARANESP ) injection - DIALYSIS  100 mcg Subcutaneous Q Sat-1800   ferrous sulfate   325 mg Oral Daily   heparin   5,000 Units Subcutaneous Q8H   insulin  aspart  0-5 Units Subcutaneous QHS   insulin  aspart  0-6 Units Subcutaneous TID WC   multivitamin with minerals  1 tablet Oral Daily   sertraline   25 mg Oral Daily   sodium chloride  flush  3 mL Intravenous Q12H    PRN meds: acetaminophen  **OR** acetaminophen , bisacodyl , haloperidol  lactate, hydrALAZINE , HYDROmorphone  (DILAUDID ) injection, ipratropium, ondansetron  **OR** ondansetron  (ZOFRAN ) IV, oxyCODONE , senna-docusate   Infusions:     Antimicrobials: Anti-infectives (From admission, onward)    Start     Dose/Rate Route Frequency Ordered Stop   04/29/24 0900  ceFEPIme  (MAXIPIME ) 2 g in sodium chloride  0.9 % 100 mL IVPB  Status:  Discontinued        2 g 200 mL/hr over 30 Minutes Intravenous Every 24 hours 04/28/24 0851 04/29/24 0847   04/28/24 0851  vancomycin  variable dose per unstable renal function (pharmacist dosing)  Status:  Discontinued         Does not apply See admin instructions 04/28/24 0851 04/29/24 0847   04/28/24 0830  ceFEPIme  (MAXIPIME ) 2 g in sodium chloride  0.9 % 100 mL IVPB        2 g 200 mL/hr over 30 Minutes Intravenous  Once 04/28/24 0820 04/28/24 0955   04/28/24 0830  metroNIDAZOLE  (FLAGYL ) IVPB 500 mg  Status:  Discontinued        500 mg 100 mL/hr over 60 Minutes Intravenous Every 12 hours 04/28/24 0820 04/29/24 0847   04/28/24 0830  vancomycin  (VANCOREADY) IVPB 2000 mg/400 mL        2,000 mg 200 mL/hr over 120 Minutes Intravenous  Once 04/28/24 0820 04/28/24 1208       Objective: Vitals:   05/06/24 2000 05/07/24 0800  BP: (!) 123/50 (!) 132/52   Pulse: 66 66  Resp:  18  Temp:  99.1 F (37.3 C)  SpO2: 98% 97%    Intake/Output Summary (Last 24 hours) at 05/07/2024 1428 Last data filed at 05/06/2024 1717 Gross per 24 hour  Intake --  Output 1500 ml  Net -1500 ml   Filed Weights   05/06/24 1400 05/06/24 1717 05/07/24 0500  Weight: 94.4 kg 92.8 kg 94.2 kg   Weight change: -2.7 kg Body mass index is 28.17 kg/m.   Physical Exam: General exam: Pleasant, elderly African-American male.  Not in distress Skin: No rashes, lesions or ulcers. HEENT: Atraumatic, normocephalic, no obvious bleeding Lungs: Clear to auscultation bilaterally,  CVS: S1, S2, no murmur,   GI/Abd: Soft, nontender, nondistended, bowel sound present,   CNS: Alert, awake, oriented to place and person.  Slow to respond Psychiatry: Mood appropriate Extremities: No pedal edema, no calf tenderness,   Data Review: I have personally reviewed the laboratory data and studies available.  F/u labs ordered Unresulted Labs (From admission, onward)     Start     Ordered   05/08/24 0500  Basic metabolic panel with GFR  Tomorrow morning,   R       Question:  Specimen collection method  Answer:  Lab=Lab collect   05/07/24 0920   05/08/24 0500  CBC with Differential/Platelet  Tomorrow morning,   R  Question:  Specimen collection method  Answer:  Lab=Lab collect   05/07/24 0920            Signed, Chapman Rota, MD [facility] 05/07/2024

## 2024-05-07 NOTE — Plan of Care (Signed)
°  Problem: Education: Goal: Ability to describe self-care measures that may prevent or decrease complications (Diabetes Survival Skills Education) will improve Outcome: Progressing Goal: Individualized Educational Video(s) Outcome: Progressing   Problem: Coping: Goal: Ability to adjust to condition or change in health will improve Outcome: Progressing   Problem: Fluid Volume: Goal: Ability to maintain a balanced intake and output will improve Outcome: Progressing   Problem: Health Behavior/Discharge Planning: Goal: Ability to identify and utilize available resources and services will improve Outcome: Progressing Goal: Ability to manage health-related needs will improve Outcome: Progressing   Problem: Metabolic: Goal: Ability to maintain appropriate glucose levels will improve Outcome: Progressing   Problem: Nutritional: Goal: Maintenance of adequate nutrition will improve Outcome: Progressing Goal: Progress toward achieving an optimal weight will improve Outcome: Progressing   Problem: Skin Integrity: Goal: Risk for impaired skin integrity will decrease Outcome: Progressing   Problem: Tissue Perfusion: Goal: Adequacy of tissue perfusion will improve Outcome: Progressing   Problem: Fluid Volume: Goal: Hemodynamic stability will improve Outcome: Progressing   Problem: Clinical Measurements: Goal: Diagnostic test results will improve Outcome: Progressing Goal: Signs and symptoms of infection will decrease Outcome: Progressing   Problem: Respiratory: Goal: Ability to maintain adequate ventilation will improve Outcome: Progressing   Problem: Education: Goal: Knowledge of General Education information will improve Description: Including pain rating scale, medication(s)/side effects and non-pharmacologic comfort measures Outcome: Progressing   Problem: Health Behavior/Discharge Planning: Goal: Ability to manage health-related needs will improve Outcome:  Progressing   Problem: Clinical Measurements: Goal: Ability to maintain clinical measurements within normal limits will improve Outcome: Progressing Goal: Will remain free from infection Outcome: Progressing Goal: Diagnostic test results will improve Outcome: Progressing Goal: Respiratory complications will improve Outcome: Progressing Goal: Cardiovascular complication will be avoided Outcome: Progressing   Problem: Activity: Goal: Risk for activity intolerance will decrease Outcome: Progressing   Problem: Nutrition: Goal: Adequate nutrition will be maintained Outcome: Progressing   Problem: Coping: Goal: Level of anxiety will decrease Outcome: Progressing   Problem: Elimination: Goal: Will not experience complications related to bowel motility Outcome: Progressing Goal: Will not experience complications related to urinary retention Outcome: Progressing   Problem: Pain Managment: Goal: General experience of comfort will improve and/or be controlled Outcome: Progressing   Problem: Safety: Goal: Ability to remain free from injury will improve Outcome: Progressing   Problem: Skin Integrity: Goal: Risk for impaired skin integrity will decrease Outcome: Progressing   Problem: Safety: Goal: Non-violent Restraint(s) Outcome: Progressing   Problem: Education: Goal: Knowledge of disease and its progression will improve Outcome: Progressing Goal: Individualized Educational Video(s) Outcome: Progressing   Problem: Fluid Volume: Goal: Compliance with measures to maintain balanced fluid volume will improve Outcome: Progressing   Problem: Health Behavior/Discharge Planning: Goal: Ability to manage health-related needs will improve Outcome: Progressing   Problem: Nutritional: Goal: Ability to make healthy dietary choices will improve Outcome: Progressing   Problem: Clinical Measurements: Goal: Complications related to the disease process, condition or treatment  will be avoided or minimized Outcome: Progressing

## 2024-05-07 NOTE — Progress Notes (Signed)
.  [facility] Progress Note   Assessment/ Plan:   # CKD stage V with progression to ESRD  - With uremic symptoms/AMS.  HD started on 12/6 via his existing LUE AVF - Plan for HD on 12/8-- infiltration - VVS with fgram 12/8, greatly appreciate - HD 12/9, next 12/11--> not successful [facility] 12/11 with VVS, appreciate HD 12/12, 12/13 to get 3 rx this week Good T/b in the access- I am thinking that maybe just needs to rest for now and to attempt cannulation protocol with cannulation team as OP - plan for next HD Tuesday - CLIP in process   # AMS - sundowning vs gaba toxicity vs uremic - getting better   # Hyperkalemia  - mild and improved with HD - continue renal diabetic diet      # HTN  - reasonably well controlled   # Anemia of CKD  - last retacrit  10,000 units 04/24/24 - Aranesp  100 mg today   # Metabolic bone disease - calcitriol  with dialysis  Subjective:    Seen in room.  Feeling tired but better- wanting to go Christmas shopping   Objective:   BP (!) 132/52 (BP Location: Right Arm)   Pulse 66   Temp 99.1 F (37.3 C) (Oral)   Resp 18   Ht 6' (1.829 m)   Wt 94.2 kg   SpO2 97%   BMI 28.17 kg/m   Physical Exam: Hzw:noizm, looks better CVS:RRR Resp:clear Jai:dnqu Ext:2+ LE edema, healed skin tears ACCESS: L AVF + T/B, R [facility]  Labs: BMET Recent Labs  Lab 05/01/24 0352 05/01/24 0755 05/02/24 0755 05/04/24 1324 05/06/24 1137  NA 145 144 142 141 140  K 4.1 4.2 4.2 4.0 4.2  CL 110 107 106 105 104  CO2 24 25 16*  --  29  GLUCOSE 95 89 103* 90 86  BUN 53* 53* 60* 46* 31*  CREATININE 5.80* 5.85* 6.31* 5.70* 4.59*  CALCIUM  8.8* 9.0 8.9  --  8.6*  PHOS  --  3.9 3.8  --  3.2   CBC Recent Labs  Lab 05/01/24 0755 05/02/24 0755 05/04/24 1324 05/06/24 1137  WBC 4.8 4.6  --  3.0*  NEUTROABS  --  2.8  --   --   HGB 8.6* 8.1* 9.5* 7.8*  HCT 27.3* 26.4* 28.0* 24.7*  MCV 99.6 101.9*  --  100.0  PLT 107* 100*  --  86*      Medications:      allopurinol   100 mg Oral Daily   amLODipine   10 mg Oral QPM   aspirin  EC  81 mg Oral Daily   atorvastatin   40 mg Oral Daily   calcitRIOL   0.25 mcg Oral Q M,W,F   carvedilol   25 mg Oral BID WC   Chlorhexidine  Gluconate Cloth  6 each Topical Q0600   Chlorhexidine  Gluconate Cloth  6 each Topical Q0600   darbepoetin (ARANESP ) injection - DIALYSIS  100 mcg Subcutaneous Q Sat-1800   ferrous sulfate   325 mg Oral Daily   heparin   5,000 Units Subcutaneous Q8H   insulin  aspart  0-5 Units Subcutaneous QHS   insulin  aspart  0-6 Units Subcutaneous TID WC   multivitamin with minerals  1 tablet Oral Daily   sertraline   25 mg Oral Daily   sodium chloride  flush  3 mL Intravenous Q12H     Almarie Bonine, MD 05/07/2024, 10:31 AM

## 2024-05-07 NOTE — Plan of Care (Signed)
  Problem: Education: Goal: Ability to describe self-care measures that may prevent or decrease complications (Diabetes Survival Skills Education) will improve Outcome: Progressing   Problem: Fluid Volume: Goal: Ability to maintain a balanced intake and output will improve Outcome: Progressing   Problem: Metabolic: Goal: Ability to maintain appropriate glucose levels will improve Outcome: Progressing   Problem: Nutritional: Goal: Maintenance of adequate nutrition will improve Outcome: Progressing

## 2024-05-08 ENCOUNTER — Encounter (HOSPITAL_COMMUNITY)

## 2024-05-08 LAB — CBC WITH DIFFERENTIAL/PLATELET
Abs Immature Granulocytes: 0.01 K/uL (ref 0.00–0.07)
Basophils Absolute: 0 K/uL (ref 0.0–0.1)
Basophils Relative: 0 %
Eosinophils Absolute: 0.1 K/uL (ref 0.0–0.5)
Eosinophils Relative: 4 %
HCT: 26.6 % — ABNORMAL LOW (ref 39.0–52.0)
Hemoglobin: 8.2 g/dL — ABNORMAL LOW (ref 13.0–17.0)
Immature Granulocytes: 0 %
Lymphocytes Relative: 42 %
Lymphs Abs: 1.5 K/uL (ref 0.7–4.0)
MCH: 31.3 pg (ref 26.0–34.0)
MCHC: 30.8 g/dL (ref 30.0–36.0)
MCV: 101.5 fL — ABNORMAL HIGH (ref 80.0–100.0)
Monocytes Absolute: 0.4 K/uL (ref 0.1–1.0)
Monocytes Relative: 11 %
Neutro Abs: 1.6 K/uL — ABNORMAL LOW (ref 1.7–7.7)
Neutrophils Relative %: 43 %
Platelets: 95 K/uL — ABNORMAL LOW (ref 150–400)
RBC: 2.62 MIL/uL — ABNORMAL LOW (ref 4.22–5.81)
RDW: 14.1 % (ref 11.5–15.5)
WBC: 3.6 K/uL — ABNORMAL LOW (ref 4.0–10.5)
nRBC: 0 % (ref 0.0–0.2)

## 2024-05-08 LAB — BASIC METABOLIC PANEL WITH GFR
Anion gap: 7 (ref 5–15)
BUN: 44 mg/dL — ABNORMAL HIGH (ref 8–23)
CO2: 30 mmol/L (ref 22–32)
Calcium: 8.7 mg/dL — ABNORMAL LOW (ref 8.9–10.3)
Chloride: 105 mmol/L (ref 98–111)
Creatinine, Ser: 5.95 mg/dL — ABNORMAL HIGH (ref 0.61–1.24)
GFR, Estimated: 9 mL/min — ABNORMAL LOW (ref >60)
Glucose, Bld: 72 mg/dL (ref 70–99)
Potassium: 4.5 mmol/L (ref 3.5–5.1)
Sodium: 142 mmol/L (ref 135–145)

## 2024-05-08 LAB — GLUCOSE, CAPILLARY
Glucose-Capillary: 89 mg/dL (ref 70–99)
Glucose-Capillary: 85 mg/dL (ref 70–99)
Glucose-Capillary: 152 mg/dL — ABNORMAL HIGH (ref 70–99)
Glucose-Capillary: 122 mg/dL — ABNORMAL HIGH (ref 70–99)
Glucose-Capillary: 125 mg/dL — ABNORMAL HIGH (ref 70–99)

## 2024-05-08 NOTE — Progress Notes (Signed)
.  [facility] Progress Note   Assessment/ Plan:   # CKD stage V with progression to ESRD  - With uremic symptoms/AMS.  HD started on 12/6 via his existing LUE AVF - Plan for HD on 12/8-- infiltration - VVS with fgram 12/8, greatly appreciate - HD 12/9, next 12/11--> not successful [facility] 12/11 with VVS, appreciate HD 12/12, 12/13 to get 3 rx this week Good T/b in the access- I am thinking that maybe just needs to rest for now and to attempt cannulation protocol with cannulation team as OP - plan for next HD Tuesday - CLIP in process   # AMS - sundowning vs gaba toxicity vs uremic - getting better - per primary service   # Hyperkalemia, resolved  - managing with HD - continue renal diabetic diet      # HTN  - reasonably well controlled   # Anemia of CKD  - last retacrit  10,000 units 04/24/24 - Aranesp  100 mg qsat   # Metabolic bone disease - calcitriol  with dialysis  Subjective:    Seen and examined in room. No complaints today. Eager to get home before Christmas   Objective:   BP (!) 132/50 (BP Location: Right Arm)   Pulse 65   Temp 98.7 F (37.1 C) (Oral)   Resp 16   Ht 6' (1.829 m)   Wt 94.1 kg   SpO2 98%   BMI 28.14 kg/m   Physical Exam: Gen: NAD CVS:RRR Resp: cta b/l Jai:dnqu Ext: trace  edema b/l LEs, healed skin tears ACCESS: L AVF + T/B (with surrounding ecchymosis-soft), R TDC  Labs: BMET Recent Labs  Lab 05/02/24 0755 05/04/24 1324 05/06/24 1137 05/08/24 0333  NA 142 141 140 142  K 4.2 4.0 4.2 4.5  CL 106 105 104 105  CO2 16*  --  29 30  GLUCOSE 103* 90 86 72  BUN 60* 46* 31* 44*  CREATININE 6.31* 5.70* 4.59* 5.95*  CALCIUM  8.9  --  8.6* 8.7*  PHOS 3.8  --  3.2  --    CBC Recent Labs  Lab 05/02/24 0755 05/04/24 1324 05/06/24 1137 05/08/24 0333  WBC 4.6  --  3.0* 3.6*  NEUTROABS 2.8  --   --  1.6*  HGB 8.1* 9.5* 7.8* 8.2*  HCT 26.4* 28.0* 24.7* 26.6*  MCV 101.9*  --  100.0 101.5*  PLT 100*  --  86* 95*       Medications:     allopurinol   100 mg Oral Daily   amLODipine   10 mg Oral QPM   aspirin  EC  81 mg Oral Daily   atorvastatin   40 mg Oral Daily   calcitRIOL   0.25 mcg Oral Q M,W,F   carvedilol   25 mg Oral BID WC   Chlorhexidine  Gluconate Cloth  6 each Topical Q0600   darbepoetin (ARANESP ) injection - DIALYSIS  100 mcg Subcutaneous Q Sat-1800   ferrous sulfate   325 mg Oral Daily   heparin   5,000 Units Subcutaneous Q8H   insulin  aspart  0-5 Units Subcutaneous QHS   insulin  aspart  0-6 Units Subcutaneous TID WC   multivitamin with minerals  1 tablet Oral Daily   sertraline   25 mg Oral Daily   sodium chloride  flush  3 mL Intravenous Q12H     Ephriam Stank, MD [facility] 05/08/2024, 9:54 AM

## 2024-05-08 NOTE — Progress Notes (Signed)
 PROGRESS NOTE  Kelly Dunn  DOB: 06/01/1945  PCP: Regino Slater, MD FMW:[id number]  DOA: 04/28/2024  LOS: 10 days  Hospital Day: 11  Brief narrative: Kelly Dunn is a 78 y.o. male with PMH significant for DM2, HTN, HLD, CKD 5, gout, neuropathy.    12/5, patient was brought from home with a progressive agitation, confusion.  Per family, he had similar episode earlier this year.  He has been progressively getting confused-with delusion, hallucination that someone was in a house.  911 was called, subsequently he was found in the neighbors driveway, confused, agitated, on the ground and was brought to the ED   Upon arrival to ED, he was hypothermic with bradycardia  BMP showed hyperkalemia and elevated creatinine CT head unremarkable  Admitted to [facility] for uremic encephalopathy Multiple services including nephrology, palliative care and psychiatry were consulted  From the next day 12/6 patient was initiated on dialysis.  Assessment and plan: Progressive CKD New ESRD Patient had CKD 5 and was following up with nephrology as an outpatient.  He had left AV fistula created in April 2025 but had not been yet initiated on dialysis. 12/5, presented with uremic encephalopathy 12/6, started on dialysis on 12/6  12/8, patient required balloon angioplasty and stenting of the cephalic arc of the fistula by vascular surgery. 12/11, had complications with AV fistula again.  Vascular surgery placed a tunneled dialysis catheter. 12/12, underwent successful dialysis Outpatient dialysis arrangement in process.  Acute metabolic encephalopathy Altered mental status on admission was likely secondary to uremia in the setting of advanced CKD.  Patient was also on gabapentin which was stopped Seen by psychiatry as well. Initially required chemical and physical restraints. Mental status gradually improved with subsequent dialysis. Currently continued on Zoloft  25 mg daily, Haldol  2 mg IV every 6  hours PRN for agitation EKG 12/7 QTc 420 ms. If patient needs physical restraints, nursing staff have been clearly instructed to avoid putting tight restraints on the fistula arm.  SIRS Was hypothermic and bradycardic on admission Likely because he was out on the cold.  Was found on neighbor's driveway confused. No evidence of infection. Blood culture sent on admission did not show any growth. Empiric antibiotics have been discontinued. Recent Labs  Lab 05/02/24 0755 05/06/24 1137 05/08/24 0333  WBC 4.6 3.0* 3.6*   Hyperkalemia Potassium level improved with dialysis Recent Labs  Lab 05/02/24 0755 05/04/24 1324 05/06/24 1137 05/08/24 0333  K 4.2 4.0 4.2 4.5  PHOS 3.8  --  3.2  --    Essential hypertension Blood pressure controlled, continue current management. PTA meds- Coreg , amlodipine , Lasix  Currently continued on Coreg  25 mg twice daily and amlodipine  10 mg nightly, IV hydralazine  as needed  Type 2 diabetes mellitus Hypoglycemia A1c 4.3 on 04/28/24 PTA meds- Lantus  Was hypoglycemic on 12/6.  Lantus  was discontinued Currently blood sugar is consistently less than 100.  Continue SSI/Accu-Cheks. Recent Labs  Lab 05/07/24 2042 05/07/24 2317 05/08/24 0316 05/08/24 0758 05/08/24 1155  GLUCAP 134* 92 89 85 152*   HLD Lipitor 40 mg daily  Chronic macrocytic anemia Hemoglobin at baseline between 9 and 10.  Currently mostly between 8 and 9.  No active bleeding. Hemoglobin was low at 7.8 on last check on 12/13.  Repeat again tomorrow Recent Labs    12/31/23 0931 12/31/23 9062 01/28/24 1239 01/28/24 1241 02/25/24 1228 02/25/24 1236 04/07/24 1027 04/07/24 1031 04/30/24 1019 05/01/24 0755 05/02/24 0755 05/04/24 1324 05/06/24 1137 05/08/24 0333  HGB  --    < >  --    < >  --    < >  --    < >  --  8.6* 8.1* 9.5* 7.8* 8.2*  MCV  --   --   --   --   --   --   --    < >  --  99.6 101.9*  --  100.0 101.5*  VITAMINB12  --   --   --   --   --   --   --   --  234   --   --   --   --   --   FOLATE  --   --   --   --   --   --   --   --  5.1*  --   --   --   --   --   FERRITIN 128  --  262  --  282  --  237  --  293  --   --   --   --   --   TIBC 204*  --  211*  --  197*  --  195*  --  161*  --   --   --   --   --   IRON 70  --  72  --  85  --  65  --  61  --   --   --   --   --   RETICCTPCT  --   --   --   --   --   --   --   --  1.3  --   --   --   --   --    < > = values in this interval not displayed.   H/o gout Continue allopurinol .  Diabetic neuropathy Gabapentin on hold because of altered mentation.  Nephrology recommended against restarting it.  Overweight Body mass index is 28.14 kg/m. Patient has been advised to make an attempt to improve diet and exercise patterns to aid in weight loss.  Impaired mobility:  Seen by PT PT Follow up Rec: Skilled Nursing-Short Term Rehab (<3 Hours/Day)05/05/2024 1600    Goals of care   Code Status: Limited: Do not attempt resuscitation (DNR) -DNR-LIMITED -Do Not Intubate/DNI      DVT prophylaxis:  heparin  injection 5,000 Units Start: 04/28/24 0815 SCDs Start: 04/28/24 0759   Antimicrobials: None currently Fluid: None Consultants: Nephrology Family Communication: None at bedside  Status: Inpatient Level of care:  Med-Surg   Patient is from: Home Needs to continue in-hospital care: newly started on dialysis.  Outpatient chair arrangement in process    Diet:  Diet Order             Diet renal with fluid restriction Fluid restriction: 1200 mL Fluid; Room service appropriate? Yes; Fluid consistency: Thin  Diet effective now                   Scheduled Meds:  allopurinol   100 mg Oral Daily   amLODipine   10 mg Oral QPM   aspirin  EC  81 mg Oral Daily   atorvastatin   40 mg Oral Daily   calcitRIOL   0.25 mcg Oral Q M,W,F   carvedilol   25 mg Oral BID WC   Chlorhexidine  Gluconate Cloth  6 each Topical Q0600   darbepoetin (ARANESP ) injection - DIALYSIS  100 mcg Subcutaneous Q Sat-1800    ferrous sulfate   325 mg Oral Daily   heparin   5,000 Units Subcutaneous Q8H   insulin  aspart  0-5 Units Subcutaneous QHS   insulin  aspart  0-6 Units Subcutaneous TID  WC   multivitamin with minerals  1 tablet Oral Daily   sertraline   25 mg Oral Daily   sodium chloride  flush  3 mL Intravenous Q12H    PRN meds: acetaminophen  **OR** acetaminophen , bisacodyl , haloperidol  lactate, hydrALAZINE , HYDROmorphone  (DILAUDID ) injection, ipratropium, ondansetron  **OR** ondansetron  (ZOFRAN ) IV, oxyCODONE , senna-docusate   Infusions:     Antimicrobials: Anti-infectives (From admission, onward)    Start     Dose/Rate Route Frequency Ordered Stop   04/29/24 0900  ceFEPIme  (MAXIPIME ) 2 g in sodium chloride  0.9 % 100 mL IVPB  Status:  Discontinued        2 g 200 mL/hr over 30 Minutes Intravenous Every 24 hours 04/28/24 0851 04/29/24 0847   04/28/24 0851  vancomycin  variable dose per unstable renal function (pharmacist dosing)  Status:  Discontinued         Does not apply See admin instructions 04/28/24 0851 04/29/24 0847   04/28/24 0830  ceFEPIme  (MAXIPIME ) 2 g in sodium chloride  0.9 % 100 mL IVPB        2 g 200 mL/hr over 30 Minutes Intravenous  Once 04/28/24 0820 04/28/24 0955   04/28/24 0830  metroNIDAZOLE  (FLAGYL ) IVPB 500 mg  Status:  Discontinued        500 mg 100 mL/hr over 60 Minutes Intravenous Every 12 hours 04/28/24 0820 04/29/24 0847   04/28/24 0830  vancomycin  (VANCOREADY) IVPB 2000 mg/400 mL        2,000 mg 200 mL/hr over 120 Minutes Intravenous  Once 04/28/24 0820 04/28/24 1208     Subjective: Patient seen at bedside this morning, no acute overnight events.  He had no new complaints.  Alert and oriented x 3.  Pending placement  Objective: Vitals:   05/08/24 0758 05/08/24 1154  BP: (!) 132/50 (!) 126/49  Pulse: 65 65  Resp: 16 16  Temp: 98.7 F (37.1 C) 98.8 F (37.1 C)  SpO2: 98% 100%    Intake/Output Summary (Last 24 hours) at 05/08/2024 1326 Last data filed at  05/08/2024 0800 Gross per 24 hour  Intake --  Output 200 ml  Net -200 ml   Filed Weights   05/06/24 1717 05/07/24 0500 05/08/24 0500  Weight: 92.8 kg 94.2 kg 94.1 kg   Weight change: -0.3 kg Body mass index is 28.14 kg/m.   Physical Exam: General  No acute Distress Eyes: PERRL, lids and conjunctivae normal ENMT: Mucous membranes are moist.   Neck: normal, supple, no masses, no thyromegaly Respiratory: clear to auscultation bilaterally, no wheezing, no crackles. Normal respiratory effort. No accessory muscle use.  Cardiovascular: Regular rate and rhythm, no murmurs / rubs / gallops Abdomen: Soft, nontender nondistended. Musculoskeletal: no clubbing / cyanosis. No joint deformity upper and lower extremities.  Skin: no rashes, lesions, ulcers. No induration Neurologic: Facial asymmetry, moving extremity spontaneously, speech fluent. Psychiatric: Normal judgment and insight. Alert and oriented x 3. Normal mood.   Data Review: I have personally reviewed the laboratory data and studies available.  F/u labs ordered Unresulted Labs (From admission, onward)    None       Signed, Landon FORBES Baller, MD [facility] 05/08/2024

## 2024-05-08 NOTE — Progress Notes (Signed)
 02:33 RN called 5C to give report for this PT.  5C advised they do not have a pending transfer from 3w.  Spoke with charge and called bed placement, bed placement advised will not assign 5C bed no reason to transfer at this time since the 3W bed is not needed now.  Tried to explain to bed placement that we need to clean bed 31 then move 12/14 to 31 and then have the other room cleaned.  Bed placement advised they will assign at 06:00

## 2024-05-08 NOTE — Plan of Care (Signed)
   Problem: Education: Goal: Ability to describe self-care measures that may prevent or decrease complications (Diabetes Survival Skills Education) will improve Outcome: Progressing   Problem: Fluid Volume: Goal: Ability to maintain a balanced intake and output will improve Outcome: Progressing   Problem: Metabolic: Goal: Ability to maintain appropriate glucose levels will improve Outcome: Progressing

## 2024-05-08 NOTE — Progress Notes (Signed)
 06:40 bed placement called to get room for 5c they advised 5c capped at 12 patients

## 2024-05-08 NOTE — TOC Progression Note (Signed)
 Transition of Care [facility]) - Progression Note    Patient Details  Name: Kelly Dunn MRN: [id number] Date of Birth: 03/15/1946  Transition of Care [facility]) CM/SW Contact  Almarie CHRISTELLA Goodie, [name] Phone Number: 05/08/2024, 3:40 PM  Clinical Narrative:   CSW coordinated with [employer], liaison came to visit with patient and they are agreeable to make a bed offer. CSW met with patient to discuss, he is in agreement. CSW contacted patient's daughter, [name], and she is also in agreement, appreciative of update. CSW received missed call from patient's brother, India, asking for an update. CSW received permission from patient to speak with [place]. CSW contacted [place] and provided update to him, as well, he was appreciative. CSW contacted renal navigator to start the clipping process now that patient has a bed offer. CSW to await clip before starting insurance authorization. CSW to follow.    Expected Discharge Plan: Skilled Nursing Facility Barriers to Discharge: Continued Medical Work up, English As A Second Language Teacher, Waiting for outpatient dialysis               Expected Discharge Plan and Services     Post Acute Care Choice: Skilled Nursing Facility Living arrangements for the past 2 months: Single Family Home                                       Social Drivers of Health (SDOH) Interventions SDOH Screenings   Tobacco Use: Medium Risk (05/04/2024)    Readmission Risk Interventions     No data to display

## 2024-05-08 NOTE — Progress Notes (Signed)
 Contacted by CSW to be advised that snf placement has been confirmed for pt. [place]F [place] in [place] and snf [place] clinic in the [place] area. Referral submitted to [facility] admissions this afternoon for review. Message left for pt's daughter to introduce self and explain role. Will meet with pt as well. Will assist as needed.   Randine Mungo Dialysis Navigator [phone]

## 2024-05-09 LAB — CBC
HCT: 25.9 % — ABNORMAL LOW (ref 39.0–52.0)
Hemoglobin: 8.1 g/dL — ABNORMAL LOW (ref 13.0–17.0)
MCH: 32 pg (ref 26.0–34.0)
MCHC: 31.3 g/dL (ref 30.0–36.0)
MCV: 102.4 fL — ABNORMAL HIGH (ref 80.0–100.0)
Platelets: 103 K/uL — ABNORMAL LOW (ref 150–400)
RBC: 2.53 MIL/uL — ABNORMAL LOW (ref 4.22–5.81)
RDW: 14.2 % (ref 11.5–15.5)
WBC: 4 K/uL (ref 4.0–10.5)
nRBC: 0 % (ref 0.0–0.2)

## 2024-05-09 LAB — GLUCOSE, CAPILLARY
Glucose-Capillary: 100 mg/dL — ABNORMAL HIGH (ref 70–99)
Glucose-Capillary: 143 mg/dL — ABNORMAL HIGH (ref 70–99)
Glucose-Capillary: 159 mg/dL — ABNORMAL HIGH (ref 70–99)

## 2024-05-09 LAB — RENAL FUNCTION PANEL
Albumin: 2.9 g/dL — ABNORMAL LOW (ref 3.5–5.0)
Anion gap: 7 (ref 5–15)
BUN: 58 mg/dL — ABNORMAL HIGH (ref 8–23)
CO2: 29 mmol/L (ref 22–32)
Calcium: 8.7 mg/dL — ABNORMAL LOW (ref 8.9–10.3)
Chloride: 102 mmol/L (ref 98–111)
Creatinine, Ser: 6.58 mg/dL — ABNORMAL HIGH (ref 0.61–1.24)
GFR, Estimated: 8 mL/min — ABNORMAL LOW (ref >60)
Glucose, Bld: 142 mg/dL — ABNORMAL HIGH (ref 70–99)
Phosphorus: 3.8 mg/dL (ref 2.5–4.6)
Potassium: 4.4 mmol/L (ref 3.5–5.1)
Sodium: 138 mmol/L (ref 135–145)

## 2024-05-09 NOTE — Progress Notes (Signed)
 2 liters ultrafiltration, condition stable post hemodialysis, and report was given to the RN assuming care of this patient.

## 2024-05-09 NOTE — Progress Notes (Signed)
 Referral for out-pt HD with [facility] is currently pending. Will provide update to team as info is received from [facility] admissions. Will assist as needed.   Randine Mungo Dialysis Navigator [phone]

## 2024-05-09 NOTE — Progress Notes (Signed)
 PROGRESS NOTE  Kelly Dunn  DOB: 07/02/1945  PCP: Regino Slater, MD FMW:[id number]  DOA: 04/28/2024  LOS: 11 days  Hospital Day: 12  Subjective: Patient was seen and examined this morning at dialysis. Lying down in bed.  Not in distress.  Alert, awake, oriented x 3  In the last 48 hours, patient has been afebrile, hemodynamically stable, breathing on room air Labs from this morning with no remarkable change compared to yesterday. Outpatient dialysis chair arrangement in process.   Brief narrative: Kelly Dunn is a 78 y.o. male with PMH significant for DM2, HTN, HLD, CKD 5, gout, neuropathy.    12/5, patient was brought from home with a progressive agitation, confusion.  Per family, he had similar episode earlier this year.  He has been progressively getting confused-with delusion, hallucination that someone was in a house.  911 was called, subsequently he was found in the neighbors driveway, confused, agitated, on the ground and was brought to the ED   Upon arrival to ED, he was hypothermic with bradycardia  BMP showed hyperkalemia and elevated creatinine CT head unremarkable  Admitted to [facility] for uremic encephalopathy Multiple services including nephrology, palliative care and psychiatry were consulted  From the next day 12/6 patient was initiated on dialysis.  Assessment and plan: Progressive CKD New ESRD Patient had CKD 5 and was following up with nephrology as an outpatient.  He had left AV fistula created in April 2025 but had not been yet initiated on dialysis. 12/5, presented with uremic encephalopathy 12/6, started on dialysis on 12/6  12/8, patient required balloon angioplasty and stenting of the cephalic arc of the fistula by vascular surgery. 12/11, had complications with AV fistula again.  Vascular surgery placed a tunneled dialysis catheter. 12/12, underwent successful dialysis Outpatient dialysis arrangement in process.  Acute metabolic  encephalopathy Altered mental status on admission was likely secondary to uremia in the setting of advanced CKD.  Patient was also on gabapentin which was stopped Seen by psychiatry as well. Initially required chemical and physical restraints. Mental status gradually improved with subsequent dialysis. Currently continued on Zoloft  25 mg daily, Haldol  2 mg IV every 6 hours PRN for agitation EKG 12/7 QTc 420 ms. If patient needs physical restraints, nursing staff have been clearly instructed to avoid putting tight restraints on the fistula arm.  SIRS Was hypothermic and bradycardic on admission Likely because he was out on the cold.  Was found on neighbor's driveway confused. No evidence of infection. Blood culture sent on admission did not show any growth. Empiric antibiotics have been discontinued. Recent Labs  Lab 05/06/24 1137 05/08/24 0333 05/09/24 0840  WBC 3.0* 3.6* 4.0   Hyperkalemia Potassium level improved with dialysis Recent Labs  Lab 05/04/24 1324 05/06/24 1137 05/08/24 0333 05/09/24 0840  K 4.0 4.2 4.5 4.4  PHOS  --  3.2  --  3.8   Essential hypertension Blood pressure controlled, continue current management. PTA meds- Coreg , amlodipine , Lasix  Currently continued on Coreg  25 mg twice daily and amlodipine  10 mg nightly, IV hydralazine  as needed  Type 2 diabetes mellitus Hypoglycemia A1c 4.3 on 04/28/24 PTA meds- Lantus  Was hypoglycemic on 12/6.  Lantus  was discontinued Currently blood sugar is consistently less than 100.  Continue SSI/Accu-Cheks. Recent Labs  Lab 05/08/24 0316 05/08/24 0758 05/08/24 1155 05/08/24 1603 05/08/24 2059  GLUCAP 89 85 152* 122* 125*   HLD Lipitor 40 mg daily  Chronic macrocytic anemia Hemoglobin at baseline between 9 and 10.  Currently mostly between 8 and  9.  No active bleeding. Hemoglobin was low at 7.8 on last check on 12/13.  Repeat again tomorrow Recent Labs    12/31/23 0931 12/31/23 0937 01/28/24 1239  01/28/24 1241 02/25/24 1228 02/25/24 1236 04/07/24 1027 04/07/24 1031 04/30/24 1019 05/01/24 0755 05/02/24 0755 05/04/24 1324 05/06/24 1137 05/08/24 0333 05/09/24 0840  HGB  --    < >  --    < >  --    < >  --    < >  --    < > 8.1* 9.5* 7.8* 8.2* 8.1*  MCV  --   --   --   --   --   --   --    < >  --    < > 101.9*  --  100.0 101.5* 102.4*  VITAMINB12  --   --   --   --   --   --   --   --  234  --   --   --   --   --   --   FOLATE  --   --   --   --   --   --   --   --  5.1*  --   --   --   --   --   --   FERRITIN 128  --  262  --  282  --  237  --  293  --   --   --   --   --   --   TIBC 204*  --  211*  --  197*  --  195*  --  161*  --   --   --   --   --   --   IRON 70  --  72  --  85  --  65  --  61  --   --   --   --   --   --   RETICCTPCT  --   --   --   --   --   --   --   --  1.3  --   --   --   --   --   --    < > = values in this interval not displayed.   H/o gout Continue allopurinol .  Diabetic neuropathy Gabapentin on hold because of altered mentation.  Nephrology recommended against restarting it.  Overweight Body mass index is 29.66 kg/m. Patient has been advised to make an attempt to improve diet and exercise patterns to aid in weight loss.  Impaired mobility:  Seen by PT PT Follow up Rec: Skilled Nursing-Short Term Rehab (<3 Hours/Day)05/05/2024 1600    Goals of care   Code Status: Limited: Do not attempt resuscitation (DNR) -DNR-LIMITED -Do Not Intubate/DNI      DVT prophylaxis:  heparin  injection 5,000 Units Start: 04/28/24 0815 SCDs Start: 04/28/24 0759   Antimicrobials: None currently Fluid: None Consultants: Nephrology Family Communication: None at bedside  Status: Inpatient Level of care:  Med-Surg   Patient is from: Home Needs to continue in-hospital care: newly started on dialysis.  Outpatient chair arrangement in process    Diet:  Diet Order             Diet renal with fluid restriction Fluid restriction: 1200 mL Fluid; Room  service appropriate? Yes; Fluid consistency: Thin  Diet effective now  Scheduled Meds:  allopurinol   100 mg Oral Daily   amLODipine   10 mg Oral QPM   aspirin  EC  81 mg Oral Daily   atorvastatin   40 mg Oral Daily   calcitRIOL   0.25 mcg Oral Q M,W,F   carvedilol   25 mg Oral BID WC   Chlorhexidine  Gluconate Cloth  6 each Topical Q0600   darbepoetin (ARANESP ) injection - DIALYSIS  100 mcg Subcutaneous Q Sat-1800   ferrous sulfate   325 mg Oral Daily   heparin   5,000 Units Subcutaneous Q8H   insulin  aspart  0-5 Units Subcutaneous QHS   insulin  aspart  0-6 Units Subcutaneous TID WC   multivitamin with minerals  1 tablet Oral Daily   sertraline   25 mg Oral Daily   sodium chloride  flush  3 mL Intravenous Q12H    PRN meds: acetaminophen  **OR** acetaminophen , bisacodyl , haloperidol  lactate, hydrALAZINE , HYDROmorphone  (DILAUDID ) injection, ipratropium, ondansetron  **OR** ondansetron  (ZOFRAN ) IV, oxyCODONE , senna-docusate   Infusions:     Antimicrobials: Anti-infectives (From admission, onward)    Start     Dose/Rate Route Frequency Ordered Stop   04/29/24 0900  ceFEPIme  (MAXIPIME ) 2 g in sodium chloride  0.9 % 100 mL IVPB  Status:  Discontinued        2 g 200 mL/hr over 30 Minutes Intravenous Every 24 hours 04/28/24 0851 04/29/24 0847   04/28/24 0851  vancomycin  variable dose per unstable renal function (pharmacist dosing)  Status:  Discontinued         Does not apply See admin instructions 04/28/24 0851 04/29/24 0847   04/28/24 0830  ceFEPIme  (MAXIPIME ) 2 g in sodium chloride  0.9 % 100 mL IVPB        2 g 200 mL/hr over 30 Minutes Intravenous  Once 04/28/24 0820 04/28/24 0955   04/28/24 0830  metroNIDAZOLE  (FLAGYL ) IVPB 500 mg  Status:  Discontinued        500 mg 100 mL/hr over 60 Minutes Intravenous Every 12 hours 04/28/24 0820 04/29/24 0847   04/28/24 0830  vancomycin  (VANCOREADY) IVPB 2000 mg/400 mL        2,000 mg 200 mL/hr over 120 Minutes Intravenous  Once  04/28/24 0820 04/28/24 1208       Objective: Vitals:   05/09/24 0930 05/09/24 1000  BP: (!) 134/56   Pulse: 62 67  Resp: 15 (!) 22  Temp:    SpO2: 99% 100%    Intake/Output Summary (Last 24 hours) at 05/09/2024 1026 Last data filed at 05/09/2024 0448 Gross per 24 hour  Intake 240 ml  Output 275 ml  Net -35 ml   Filed Weights   05/08/24 0500 05/09/24 0447 05/09/24 0751  Weight: 94.1 kg 95 kg 99.2 kg   Weight change: 0.9 kg Body mass index is 29.66 kg/m.   Physical Exam: General exam: Pleasant, elderly African-American male.  Not in distress Skin: No rashes, lesions or ulcers. HEENT: Atraumatic, normocephalic, no obvious bleeding Lungs: Clear to auscultation bilaterally,  CVS: S1, S2, no murmur,   GI/Abd: Soft, nontender, nondistended, bowel sound present,   CNS: Alert, awake, oriented to place and person.  Slow to respond Psychiatry: Mood appropriate Extremities: No pedal edema, no calf tenderness,   Data Review: I have personally reviewed the laboratory data and studies available.  F/u labs ordered Unresulted Labs (From admission, onward)    None       Signed, Chapman Rota, MD [facility] 05/09/2024

## 2024-05-09 NOTE — TOC Progression Note (Signed)
 Transition of Care [facility]) - Progression Note    Patient Details  Name: Kelly Dunn MRN: [id number] Date of Birth: [birth year]  Transition of Care [facility]) CM/SW Contact  Lauraine FORBES Saa, LCSWA Phone Number: 05/09/2024, 9:48 AM  Clinical Narrative:     9:48 AM Per Renal Navigator, patient's referral for [facility] outpatient dialysis clinics is currently pending. CSW made SNF aware. CSW will continue to follow.  Expected Discharge Plan: Skilled Nursing Facility Barriers to Discharge: Continued Medical Work up, English As A Second Language Teacher, Waiting for outpatient dialysis               Expected Discharge Plan and Services     Post Acute Care Choice: Skilled Nursing Facility Living arrangements for the past 2 months: Single Family Home                                       Social Drivers of Health (SDOH) Interventions SDOH Screenings   Tobacco Use: Medium Risk (05/04/2024)    Readmission Risk Interventions     No data to display

## 2024-05-09 NOTE — Plan of Care (Signed)
°  Problem: Education: Goal: Ability to describe self-care measures that may prevent or decrease complications (Diabetes Survival Skills Education) will improve Outcome: Progressing Goal: Individualized Educational Video(s) Outcome: Progressing   Problem: Coping: Goal: Ability to adjust to condition or change in health will improve Outcome: Progressing   Problem: Fluid Volume: Goal: Ability to maintain a balanced intake and output will improve Outcome: Progressing   Problem: Health Behavior/Discharge Planning: Goal: Ability to identify and utilize available resources and services will improve Outcome: Progressing Goal: Ability to manage health-related needs will improve Outcome: Progressing   Problem: Metabolic: Goal: Ability to maintain appropriate glucose levels will improve Outcome: Progressing   Problem: Nutritional: Goal: Maintenance of adequate nutrition will improve Outcome: Progressing Goal: Progress toward achieving an optimal weight will improve Outcome: Progressing   Problem: Skin Integrity: Goal: Risk for impaired skin integrity will decrease Outcome: Progressing   Problem: Tissue Perfusion: Goal: Adequacy of tissue perfusion will improve Outcome: Progressing   Problem: Fluid Volume: Goal: Hemodynamic stability will improve Outcome: Progressing   Problem: Clinical Measurements: Goal: Diagnostic test results will improve Outcome: Progressing Goal: Signs and symptoms of infection will decrease Outcome: Progressing   Problem: Respiratory: Goal: Ability to maintain adequate ventilation will improve Outcome: Progressing   Problem: Education: Goal: Knowledge of General Education information will improve Description: Including pain rating scale, medication(s)/side effects and non-pharmacologic comfort measures Outcome: Progressing   Problem: Health Behavior/Discharge Planning: Goal: Ability to manage health-related needs will improve Outcome:  Progressing   Problem: Clinical Measurements: Goal: Ability to maintain clinical measurements within normal limits will improve Outcome: Progressing Goal: Will remain free from infection Outcome: Progressing Goal: Diagnostic test results will improve Outcome: Progressing Goal: Respiratory complications will improve Outcome: Progressing Goal: Cardiovascular complication will be avoided Outcome: Progressing   Problem: Activity: Goal: Risk for activity intolerance will decrease Outcome: Progressing   Problem: Nutrition: Goal: Adequate nutrition will be maintained Outcome: Progressing   Problem: Coping: Goal: Level of anxiety will decrease Outcome: Progressing   Problem: Elimination: Goal: Will not experience complications related to bowel motility Outcome: Progressing Goal: Will not experience complications related to urinary retention Outcome: Progressing   Problem: Pain Managment: Goal: General experience of comfort will improve and/or be controlled Outcome: Progressing   Problem: Safety: Goal: Ability to remain free from injury will improve Outcome: Progressing   Problem: Skin Integrity: Goal: Risk for impaired skin integrity will decrease Outcome: Progressing   Problem: Safety: Goal: Non-violent Restraint(s) Outcome: Progressing   Problem: Education: Goal: Knowledge of disease and its progression will improve Outcome: Progressing Goal: Individualized Educational Video(s) Outcome: Progressing   Problem: Fluid Volume: Goal: Compliance with measures to maintain balanced fluid volume will improve Outcome: Progressing   Problem: Health Behavior/Discharge Planning: Goal: Ability to manage health-related needs will improve Outcome: Progressing   Problem: Nutritional: Goal: Ability to make healthy dietary choices will improve Outcome: Progressing   Problem: Clinical Measurements: Goal: Complications related to the disease process, condition or treatment  will be avoided or minimized Outcome: Progressing

## 2024-05-09 NOTE — Progress Notes (Signed)
.  [facility] Progress Note   Assessment/ Plan:   # CKD stage V with progression to ESRD  - With uremic symptoms/AMS.  HD started on 12/6 via his existing LUE AVF - Plan for HD on 12/8-- infiltration - VVS with fgram 12/8, greatly appreciate - HD 12/9, next 12/11--> not successful [facility] 12/11 with VVS, appreciate HD 12/12, 12/13 to get 3 rx this week Good T/b in the access- I am thinking that maybe just needs to rest for now and to attempt cannulation protocol with cannulation team as OP - plan for next HD on TTS schedule for now - CLIP in process   # AMS - sundowning vs gaba toxicity vs uremic - getting better - per primary service   # Hyperkalemia, resolved  - managing with HD - continue renal diabetic diet      # HTN  - reasonably well controlled -UF as tolerated with HD   # Anemia of CKD  - last retacrit  10,000 units 04/24/24 - Aranesp  100 mg qsat. Iron replete -hgb 8.1   # Metabolic bone disease - calcitriol  with dialysis -phos acceptable  Dispo: to be discharged to SNF ([facility]), outpatient HD placement pending  Subjective:    Seen and examined on dialysis. No complaints today   Objective:   BP (!) 129/55   Pulse 63   Temp 98.7 F (37.1 C)   Resp (!) 21   Ht 6' (1.829 m)   Wt 99.2 kg   SpO2 99%   BMI 29.66 kg/m   Physical Exam: Gen: NAD CVS:RRR Resp: cta b/l Jai:dnqu Ext: trace  edema b/l LEs, healed skin tears ACCESS: L AVF + T/B (with surrounding ecchymosis-soft), R TDC  Labs: BMET Recent Labs  Lab 05/04/24 1324 05/06/24 1137 05/08/24 0333 05/09/24 0840  NA 141 140 142 138  K 4.0 4.2 4.5 4.4  CL 105 104 105 102  CO2  --  29 30 29   GLUCOSE 90 86 72 142*  BUN 46* 31* 44* 58*  CREATININE 5.70* 4.59* 5.95* 6.58*  CALCIUM   --  8.6* 8.7* 8.7*  PHOS  --  3.2  --  3.8   CBC Recent Labs  Lab 05/04/24 1324 05/06/24 1137 05/08/24 0333 05/09/24 0840  WBC  --  3.0* 3.6* 4.0  NEUTROABS  --   --  1.6*  --    HGB 9.5* 7.8* 8.2* 8.1*  HCT 28.0* 24.7* 26.6* 25.9*  MCV  --  100.0 101.5* 102.4*  PLT  --  86* 95* 103*      Medications:     allopurinol   100 mg Oral Daily   amLODipine   10 mg Oral QPM   aspirin  EC  81 mg Oral Daily   atorvastatin   40 mg Oral Daily   calcitRIOL   0.25 mcg Oral Q M,W,F   carvedilol   25 mg Oral BID WC   Chlorhexidine  Gluconate Cloth  6 each Topical Q0600   darbepoetin (ARANESP ) injection - DIALYSIS  100 mcg Subcutaneous Q Sat-1800   ferrous sulfate   325 mg Oral Daily   heparin   5,000 Units Subcutaneous Q8H   insulin  aspart  0-5 Units Subcutaneous QHS   insulin  aspart  0-6 Units Subcutaneous TID WC   multivitamin with minerals  1 tablet Oral Daily   sertraline   25 mg Oral Daily   sodium chloride  flush  3 mL Intravenous Q12H     Ephriam Stank, MD [facility] 05/09/2024, 11:08 AM

## 2024-05-09 NOTE — Progress Notes (Signed)
 PT Cancellation Note  Patient Details Name: Toua Stites MRN: [id number] DOB: [birth year]   Cancelled Treatment:    Reason Eval/Treat Not Completed: Patient at procedure or test/unavailable. Pt currently off unit at HD. Will check back as schedule allows to continue with PT POC.    Leita JONETTA Sable 05/09/2024, 8:33 AM  Leita Sable, PT, DPT Acute Rehabilitation Services Secure Chat Preferred Office: [phone]

## 2024-05-09 NOTE — Procedures (Signed)
 I was present at this dialysis session. I have reviewed the session itself and made appropriate changes.   Filed Weights   05/08/24 0500 05/09/24 0447 05/09/24 0751  Weight: 94.1 kg 95 kg 99.2 kg    Recent Labs  Lab 05/09/24 0840  NA 138  K 4.4  CL 102  CO2 29  GLUCOSE 142*  BUN 58*  CREATININE 6.58*  CALCIUM  8.7*  PHOS 3.8    Recent Labs  Lab 05/06/24 1137 05/08/24 0333 05/09/24 0840  WBC 3.0* 3.6* 4.0  NEUTROABS  --  1.6*  --   HGB 7.8* 8.2* 8.1*  HCT 24.7* 26.6* 25.9*  MCV 100.0 101.5* 102.4*  PLT 86* 95* 103*    Scheduled Meds:  allopurinol   100 mg Oral Daily   amLODipine   10 mg Oral QPM   aspirin  EC  81 mg Oral Daily   atorvastatin   40 mg Oral Daily   calcitRIOL   0.25 mcg Oral Q M,W,F   carvedilol   25 mg Oral BID WC   Chlorhexidine  Gluconate Cloth  6 each Topical Q0600   darbepoetin (ARANESP ) injection - DIALYSIS  100 mcg Subcutaneous Q Sat-1800   ferrous sulfate   325 mg Oral Daily   heparin   5,000 Units Subcutaneous Q8H   insulin  aspart  0-5 Units Subcutaneous QHS   insulin  aspart  0-6 Units Subcutaneous TID WC   multivitamin with minerals  1 tablet Oral Daily   sertraline   25 mg Oral Daily   sodium chloride  flush  3 mL Intravenous Q12H   Continuous Infusions: PRN Meds:.acetaminophen  **OR** acetaminophen , bisacodyl , haloperidol  lactate, hydrALAZINE , HYDROmorphone  (DILAUDID ) injection, ipratropium, ondansetron  **OR** ondansetron  (ZOFRAN ) IV, oxyCODONE , senna-docusate   Ephriam Stank, MD [facility] 05/09/2024, 11:04 AM

## 2024-05-09 NOTE — Progress Notes (Signed)
 Occupational Therapy Treatment Patient Details Name: Kelly Dunn MRN: [id number] DOB: [birth year] Today's Date: 05/09/2024   History of present illness 78 y.o. male admitted 12/5 with sepsis. CT (negative). Pt underwent US  and fluoroscopic guided right IJ tunneled dialysis catheter placement 12/11. PMHx: DM2, HTN, HLD, CKDV, L fistula in April 2025.   OT comments  Pt progressing toward established OT goals. Session limited by feeling faint during functional mobility into hall. Pt needing mod A for LB ADL, CGA for standing grooming, and CGA for functional mobility. Paused to adjust RW height and pt becoming faint; brought chair to sit in and pt seemed to recover well but deferred further ambulation due to pt reporting he does not think he can walk anymore. Returned to supine and BP 129/50 (75). RN notified and going to check sugar. Pt reporting he feels much better in supine.      If plan is discharge home, recommend the following:  A lot of help with bathing/dressing/bathroom;Assistance with cooking/housework;Direct supervision/assist for medications management;Direct supervision/assist for financial management;Assist for transportation;Help with stairs or ramp for entrance;Supervision due to cognitive status;A little help with walking and/or transfers   Equipment Recommendations  Other (comment) (defer next venue)    Recommendations for Other Services      Precautions / Restrictions Precautions Precautions: Fall Recall of Precautions/Restrictions: Intact Restrictions Weight Bearing Restrictions Per Provider Order: No       Mobility Bed Mobility Overal bed mobility: Needs Assistance Bed Mobility: Supine to Sit, Sit to Supine     Supine to sit: Contact guard, HOB elevated, Used rails Sit to supine: Min assist   General bed mobility comments: to bring BLE into bed    Transfers Overall transfer level: Needs assistance Equipment used: Rolling walker (2  wheels) Transfers: Sit to/from Stand Sit to Stand: Contact guard assist           General transfer comment: Pt stood from lowest bed height. Cued proper hand placement using RW. Powered up with CGA. Fair eccentric control. Cues to reach back for bed.     Balance Overall balance assessment: Needs assistance Sitting-balance support: Single extremity supported, Feet supported Sitting balance-Leahy Scale: Fair     Standing balance support: Bilateral upper extremity supported, During functional activity, Reliant on assistive device for balance Standing balance-Leahy Scale: Poor Standing balance comment: Pt dependent on RW                           ADL either performed or assessed with clinical judgement   ADL Overall ADL's : Needs assistance/impaired     Grooming: Wash/dry face;Contact guard assist;Standing               Lower Body Dressing: Moderate assistance;Sitting/lateral leans Lower Body Dressing Details (indicate cue type and reason): for socks, but pt reports he wears sketchers slip ins at baseline and does not wear socks Toilet Transfer: Contact guard assist;Ambulation;Rolling walker (2 wheels)           Functional mobility during ADLs: Contact guard assist;Rolling walker (2 wheels)      Extremity/Trunk Assessment Upper Extremity Assessment Upper Extremity Assessment: Generalized weakness   Lower Extremity Assessment Lower Extremity Assessment: Generalized weakness        Vision       Perception     Praxis     Communication Communication Communication: Impaired Factors Affecting Communication: Reduced clarity of speech   Cognition Arousal: Alert Behavior During Therapy: WFL for tasks assessed/performed  Cognition: Cognition impaired     Awareness: Online awareness impaired Memory impairment (select all impairments): Short-term memory, Working memory Attention impairment (select first level of impairment): Sustained  attention Executive functioning impairment (select all impairments): Problem solving OT - Cognition Comments: follows one step commands, pleasant and conversational                 Following commands: Intact        Cueing   Cueing Techniques: Verbal cues, Gestural cues  Exercises      Shoulder Instructions       General Comments Pt with episode of feeling faint in hall sat in chair and rolled pt back to room, but once transferred back to bed, VSS    Pertinent Vitals/ Pain       Pain Assessment Pain Assessment: Faces Faces Pain Scale: No hurt  Home Living                                          Prior Functioning/Environment              Frequency  Min 2X/week        Progress Toward Goals  OT Goals(current goals can now be found in the care plan section)  Progress towards OT goals: Progressing toward goals     Plan      Co-evaluation                 AM-PAC OT 6 Clicks Daily Activity     Outcome Measure   Help from another person eating meals?: None Help from another person taking care of personal grooming?: A Little Help from another person toileting, which includes using toliet, bedpan, or urinal?: A Little Help from another person bathing (including washing, rinsing, drying)?: A Lot Help from another person to put on and taking off regular upper body clothing?: A Little Help from another person to put on and taking off regular lower body clothing?: A Lot 6 Click Score: 17    End of Session Equipment Utilized During Treatment: Gait belt;Rolling walker (2 wheels)  OT Visit Diagnosis: Unsteadiness on feet (R26.81);Other abnormalities of gait and mobility (R26.89);Repeated falls (R29.6);Other symptoms and signs involving cognitive function;History of falling (Z91.81)   Activity Tolerance Patient tolerated treatment well   Patient Left in bed;with call bell/phone within reach;with bed alarm set   Nurse  Communication Mobility status        Time: 8482-8464 OT Time Calculation (min): 18 min  Charges: OT General Charges $OT Visit: 1 Visit OT Treatments $Self Care/Home Management : 8-22 mins  Kelly Dunn Kelly Dunn, OTR/L [facility] Acute Rehabilitation Office: [phone]   Kelly Dunn 05/09/2024, 4:45 PM

## 2024-05-09 NOTE — Plan of Care (Signed)
°  Problem: Education: Goal: Ability to describe self-care measures that may prevent or decrease complications (Diabetes Survival Skills Education) will improve Outcome: Progressing Goal: Individualized Educational Video(s) Outcome: Progressing   Problem: Coping: Goal: Ability to adjust to condition or change in health will improve Outcome: Progressing   Problem: Fluid Volume: Goal: Ability to maintain a balanced intake and output will improve Outcome: Progressing   Problem: Health Behavior/Discharge Planning: Goal: Ability to identify and utilize available resources and services will improve Outcome: Progressing Goal: Ability to manage health-related needs will improve Outcome: Progressing   Problem: Metabolic: Goal: Ability to maintain appropriate glucose levels will improve Outcome: Progressing   Problem: Nutritional: Goal: Maintenance of adequate nutrition will improve Outcome: Progressing Goal: Progress toward achieving an optimal weight will improve Outcome: Progressing   Problem: Skin Integrity: Goal: Risk for impaired skin integrity will decrease Outcome: Progressing   Problem: Tissue Perfusion: Goal: Adequacy of tissue perfusion will improve Outcome: Progressing   Problem: Clinical Measurements: Goal: Diagnostic test results will improve Outcome: Progressing Goal: Signs and symptoms of infection will decrease Outcome: Progressing   Problem: Respiratory: Goal: Ability to maintain adequate ventilation will improve Outcome: Progressing   Problem: Education: Goal: Knowledge of General Education information will improve Description: Including pain rating scale, medication(s)/side effects and non-pharmacologic comfort measures Outcome: Progressing   Problem: Activity: Goal: Risk for activity intolerance will decrease Outcome: Progressing

## 2024-05-10 DIAGNOSIS — R4182 Altered mental status, unspecified: Secondary | ICD-10-CM | POA: Diagnosis not present

## 2024-05-10 LAB — GLUCOSE, CAPILLARY
Glucose-Capillary: 91 mg/dL (ref 70–99)
Glucose-Capillary: 134 mg/dL — ABNORMAL HIGH (ref 70–99)
Glucose-Capillary: 119 mg/dL — ABNORMAL HIGH (ref 70–99)
Glucose-Capillary: 171 mg/dL — ABNORMAL HIGH (ref 70–99)

## 2024-05-10 NOTE — Progress Notes (Signed)
 [facility] Progress Note   Assessment/ Plan:   # CKD stage V with progression to ESRD  - With uremic symptoms/AMS.  HD started on 12/6 via his existing LUE AVF - Plan for HD on 12/8-- infiltration - VVS with fgram 12/8, greatly appreciate - HD 12/9, next 12/11--> not successful [facility] 12/11 with VVS, appreciate HD 12/12, 12/13 Good T/b in the access- I am thinking that maybe just needs to rest for now and to attempt cannulation protocol with cannulation team as OP - HD on TTS schedule for now - CLIP'ed to [facility] TTS 10:40am chair time, can start tomorrow if discharging today   # AMS - sundowning vs gaba toxicity vs uremic - getting better - per primary service   # Hyperkalemia, resolved  - managing with HD - continue renal diabetic diet      # HTN  - reasonably well controlled -UF as tolerated with HD   # Anemia of CKD  - last retacrit  10,000 units 04/24/24 - Aranesp  100 mg qsat. Iron replete -hgb 8.1   # Metabolic bone disease - calcitriol  with dialysis -phos acceptable  Dispo: discharge to home vs SNF? OP HD secured  Subjective:    Seen and examined bedside. Tolerated HD yesterday. Net uf 2L. No complaints   Objective:   BP (!) 121/46 (BP Location: Right Arm)   Pulse 63   Temp 98.1 F (36.7 C) (Oral)   Resp 18   Ht 6' (1.829 m)   Wt 99.2 kg   SpO2 98%   BMI 29.66 kg/m   Physical Exam: Gen: NAD CVS:RRR Resp: normal wob, unlabored Jai:dnqu Ext: no sig edema b/l LEs ACCESS: L AVF + T/B (with surrounding ecchymosis-soft), R TDC  Labs: BMET Recent Labs  Lab 05/04/24 1324 05/06/24 1137 05/08/24 0333 05/09/24 0840  NA 141 140 142 138  K 4.0 4.2 4.5 4.4  CL 105 104 105 102  CO2  --  29 30 29   GLUCOSE 90 86 72 142*  BUN 46* 31* 44* 58*  CREATININE 5.70* 4.59* 5.95* 6.58*  CALCIUM   --  8.6* 8.7* 8.7*  PHOS  --  3.2  --  3.8   CBC Recent Labs  Lab 05/04/24 1324 05/06/24 1137 05/08/24 0333 05/09/24 0840  WBC  --  3.0*  3.6* 4.0  NEUTROABS  --   --  1.6*  --   HGB 9.5* 7.8* 8.2* 8.1*  HCT 28.0* 24.7* 26.6* 25.9*  MCV  --  100.0 101.5* 102.4*  PLT  --  86* 95* 103*      Medications:     allopurinol   100 mg Oral Daily   amLODipine   10 mg Oral QPM   aspirin  EC  81 mg Oral Daily   atorvastatin   40 mg Oral Daily   calcitRIOL   0.25 mcg Oral Q M,W,F   carvedilol   25 mg Oral BID WC   Chlorhexidine  Gluconate Cloth  6 each Topical Q0600   darbepoetin (ARANESP ) injection - DIALYSIS  100 mcg Subcutaneous Q Sat-1800   ferrous sulfate   325 mg Oral Daily   heparin   5,000 Units Subcutaneous Q8H   insulin  aspart  0-5 Units Subcutaneous QHS   insulin  aspart  0-6 Units Subcutaneous TID WC   multivitamin with minerals  1 tablet Oral Daily   sertraline   25 mg Oral Daily   sodium chloride  flush  3 mL Intravenous Q12H     Ephriam Stank, MD [facility] 05/10/2024, 11:56 AM

## 2024-05-10 NOTE — Plan of Care (Signed)
°  Problem: Education: Goal: Ability to describe self-care measures that may prevent or decrease complications (Diabetes Survival Skills Education) will improve Outcome: Progressing Goal: Individualized Educational Video(s) Outcome: Progressing   Problem: Coping: Goal: Ability to adjust to condition or change in health will improve Outcome: Progressing   Problem: Fluid Volume: Goal: Ability to maintain a balanced intake and output will improve Outcome: Progressing   Problem: Health Behavior/Discharge Planning: Goal: Ability to identify and utilize available resources and services will improve Outcome: Progressing Goal: Ability to manage health-related needs will improve Outcome: Progressing   Problem: Metabolic: Goal: Ability to maintain appropriate glucose levels will improve Outcome: Progressing   Problem: Nutritional: Goal: Maintenance of adequate nutrition will improve Outcome: Progressing Goal: Progress toward achieving an optimal weight will improve Outcome: Progressing   Problem: Skin Integrity: Goal: Risk for impaired skin integrity will decrease Outcome: Progressing   Problem: Tissue Perfusion: Goal: Adequacy of tissue perfusion will improve Outcome: Progressing   Problem: Fluid Volume: Goal: Hemodynamic stability will improve Outcome: Progressing   Problem: Clinical Measurements: Goal: Diagnostic test results will improve Outcome: Progressing Goal: Signs and symptoms of infection will decrease Outcome: Progressing   Problem: Respiratory: Goal: Ability to maintain adequate ventilation will improve Outcome: Progressing   Problem: Education: Goal: Knowledge of General Education information will improve Description: Including pain rating scale, medication(s)/side effects and non-pharmacologic comfort measures Outcome: Progressing   Problem: Health Behavior/Discharge Planning: Goal: Ability to manage health-related needs will improve Outcome:  Progressing   Problem: Clinical Measurements: Goal: Ability to maintain clinical measurements within normal limits will improve Outcome: Progressing Goal: Will remain free from infection Outcome: Progressing Goal: Diagnostic test results will improve Outcome: Progressing Goal: Respiratory complications will improve Outcome: Progressing Goal: Cardiovascular complication will be avoided Outcome: Progressing   Problem: Activity: Goal: Risk for activity intolerance will decrease Outcome: Progressing   Problem: Nutrition: Goal: Adequate nutrition will be maintained Outcome: Progressing   Problem: Coping: Goal: Level of anxiety will decrease Outcome: Progressing   Problem: Elimination: Goal: Will not experience complications related to bowel motility Outcome: Progressing Goal: Will not experience complications related to urinary retention Outcome: Progressing   Problem: Pain Managment: Goal: General experience of comfort will improve and/or be controlled Outcome: Progressing   Problem: Safety: Goal: Ability to remain free from injury will improve Outcome: Progressing   Problem: Skin Integrity: Goal: Risk for impaired skin integrity will decrease Outcome: Progressing   Problem: Safety: Goal: Non-violent Restraint(s) Outcome: Progressing   Problem: Education: Goal: Knowledge of disease and its progression will improve Outcome: Progressing Goal: Individualized Educational Video(s) Outcome: Progressing   Problem: Fluid Volume: Goal: Compliance with measures to maintain balanced fluid volume will improve Outcome: Progressing   Problem: Health Behavior/Discharge Planning: Goal: Ability to manage health-related needs will improve Outcome: Progressing   Problem: Nutritional: Goal: Ability to make healthy dietary choices will improve Outcome: Progressing   Problem: Clinical Measurements: Goal: Complications related to the disease process, condition or treatment  will be avoided or minimized Outcome: Progressing

## 2024-05-10 NOTE — TOC Progression Note (Signed)
 Transition of Care [facility]) - Progression Note    Patient Details  Name: Kelly Dunn MRN: [id number] Date of Birth: 08/14/1945  [facility] CM/SW Contact  Lauraine FORBES Saa, LCSWA Phone Number: 05/10/2024, 12:02 PM  Clinical Narrative:     12:02 PM Renal Navigator informed medical team out accepted outpatient HD referral ([facility] TTS 10:40 chair time). CSW relayed information to [facility]. CSW submitted patient's SNF insurance authorization which is currently pending (ID [id number]). CSW will continue to follow.  Expected Discharge Plan: Skilled Nursing Facility Barriers to Discharge: Continued Medical Work up, [employer] and Services In-house Referral: Clinical Social Work   Post Acute Care Choice: Skilled Nursing Facility Living arrangements for the past 2 months: Single Family Home                                       Social Drivers of Health (SDOH) Interventions SDOH Screenings   Tobacco Use: Medium Risk (05/04/2024)    Readmission Risk Interventions     No data to display

## 2024-05-10 NOTE — Progress Notes (Signed)
 Physical Therapy Treatment Patient Details Name: Kelly Dunn MRN: [id number] DOB: 03-05-1946 Today's Date: 05/10/2024   History of Present Illness 78 y.o. male admitted 12/5 with sepsis. CT (negative). Pt underwent US  and fluoroscopic guided right IJ tunneled dialysis catheter placement 12/11. PMHx: DM2, HTN, HLD, CKDV, L fistula in April 2025.    PT Comments  Pt currently is presenting at Mod I for bed mobility, CGA to Min A for sit to stand with RW, CGA for gait with standing rest breaks due to fatigue and dyspnea after gait with O2 sats in 90's. Pt is unsure why he is in the hospital and had difficulty explaining home set up. Pt was Min A for stairs per home set up from chart. Pt has no assistance at home and lives alone. Due to pt current functional status, home set up and available assistance at home recommending skilled physical therapy services < 3 hours/day in order to address strength, balance and functional mobility to decrease risk for falls, injury, immobility, skin break down and re-hospitalization.      If plan is discharge home, recommend the following: A little help with walking and/or transfers;Assistance with cooking/housework;Assist for transportation;Help with stairs or ramp for entrance   Can travel by private vehicle     Yes  Equipment Recommendations  Rolling walker (2 wheels);BSC/3in1       Precautions / Restrictions Precautions Precautions: Fall Recall of Precautions/Restrictions: Intact Precaution/Restrictions Comments: delirium prevention precautions; LUE AV fistula Restrictions Weight Bearing Restrictions Per Provider Order: No     Mobility  Bed Mobility Overal bed mobility: Modified Independent Bed Mobility: Supine to Sit, Sit to Supine     Supine to sit: HOB elevated, Used rails, Modified independent (Device/Increase time) Sit to supine: Used rails, HOB elevated, Modified independent (Device/Increase time)        Transfers Overall  transfer level: Needs assistance Equipment used: Rolling walker (2 wheels) Transfers: Sit to/from Stand Sit to Stand: Contact guard assist, Min assist           General transfer comment: CGA for sit to stand and Min A for standing to sitting due to poor eccentric control and safey awareness.    Ambulation/Gait Ambulation/Gait assistance: Contact guard assist Gait Distance (Feet): 120 Feet Assistive device: Rolling walker (2 wheels) Gait Pattern/deviations: Step-through pattern, Decreased stride length Gait velocity: decreased Gait velocity interpretation: <1.31 ft/sec, indicative of household ambulator   General Gait Details: Pt with increased unsteadiness with fatigue, Pt with 3x short standing rest breaks   Stairs   Stairs assistance: Min assist Stair Management: One rail Left, Step to pattern, Forwards, Sideways Number of Stairs: 3 General stair comments: pt was unable to state how many steps he had or if he had rails, eventually said he thinks he has a rail. Pt performed steps forward ascending at Min A and Sideways after cues with L rail descending.     Balance Overall balance assessment: Needs assistance Sitting-balance support: Single extremity supported, Feet supported Sitting balance-Leahy Scale: Fair     Standing balance support: Bilateral upper extremity supported, During functional activity, Reliant on assistive device for balance Standing balance-Leahy Scale: Poor Standing balance comment: Pt dependent on RW      Communication Communication Communication: Impaired Factors Affecting Communication: Reduced clarity of speech  Cognition Arousal: Alert Behavior During Therapy: WFL for tasks assessed/performed   PT - Cognitive impairments: Safety/Judgement, Orientation   Orientation impairments: Situation       PT - Cognition Comments: has difficulty answering questions  about home set up, pt does not know why he is in the hospital Following commands:  Intact Following commands impaired: Follows one step commands with increased time, Follows multi-step commands inconsistently    Cueing Cueing Techniques: Verbal cues, Gestural cues     General Comments General comments (skin integrity, edema, etc.): No dizziness/lightheadedness noted from pt during activity.      Pertinent Vitals/Pain Pain Assessment Pain Assessment: No/denies pain Pain Intervention(s): Monitored during session     PT Goals (current goals can now be found in the care plan section) Acute Rehab PT Goals Patient Stated Goal: Get better PT Goal Formulation: With patient Time For Goal Achievement: 05/14/24 Potential to Achieve Goals: Good Progress towards PT goals: Progressing toward goals    Frequency    Min 2X/week      PT Plan  Continue with current POC        AM-PAC PT 6 Clicks Mobility   Outcome Measure  Help needed turning from your back to your side while in a flat bed without using bedrails?: A Little Help needed moving from lying on your back to sitting on the side of a flat bed without using bedrails?: A Little Help needed moving to and from a bed to a chair (including a wheelchair)?: A Little Help needed standing up from a chair using your arms (e.g., wheelchair or bedside chair)?: A Little Help needed to walk in hospital room?: A Little Help needed climbing 3-5 steps with a railing? : A Little 6 Click Score: 18    End of Session Equipment Utilized During Treatment: Gait belt Activity Tolerance: Patient tolerated treatment well Patient left: in bed;with call bell/phone within reach;with bed alarm set Nurse Communication: Mobility status PT Visit Diagnosis: Unsteadiness on feet (R26.81);Other abnormalities of gait and mobility (R26.89);Muscle weakness (generalized) (M62.81)     Time: 8956-8897 PT Time Calculation (min) (ACUTE ONLY): 19 min  Charges:    $Therapeutic Activity: 8-22 mins PT General Charges $$ ACUTE PT VISIT: 1  Visit                    Dorothyann Maier, DPT, CLT  Acute Rehabilitation Services Office: [phone] (Secure chat preferred)    Dorothyann VEAR Maier 05/10/2024, 11:15 AM

## 2024-05-10 NOTE — Progress Notes (Signed)
 Pt has been accepted at [facility] on TTS 10:40 am chair time. Pt can start tomorrow and will need to arrive at 10:00 am to complete paperwork prior to treatment. Contacted attending, nephrologist,and CSW to provide update. Per CSW, pt's d/c plan may be changing to home instead of snf. Will await confirmation of pt's d/c plan prior to speaking to pt regarding HD arrangements to ensure most accurate info provided to pt. Will assist as needed.   Randine Mungo Dialysis Navigator [phone]

## 2024-05-10 NOTE — Progress Notes (Signed)
 PROGRESS NOTE  Kelly Dunn  DOB: 1945/08/05  PCP: Regino Slater, MD FMW:[id number]  DOA: 04/28/2024  LOS: 12 days  Hospital Day: 13  Subjective: Patient was seen and examined this morning Propped up on bed. Taking his breakfast. Afebrile, hemodynamically stable, breathing on room air. Pending placement  Brief narrative: Kelly Dunn is a 78 y.o. male with PMH significant for DM2, HTN, HLD, CKD 5, gout, neuropathy.    12/5, patient was brought from home with a progressive agitation, confusion.  Per family, he had similar episode earlier this year.  He has been progressively getting confused-with delusion, hallucination that someone was in a house.  911 was called, subsequently he was found in the neighbors driveway, confused, agitated, on the ground and was brought to the ED   Upon arrival to ED, he was hypothermic with bradycardia  BMP showed hyperkalemia and elevated creatinine CT head unremarkable  Admitted to [facility] for uremic encephalopathy Multiple services including nephrology, palliative care and psychiatry were consulted  From the next day 12/6 patient was initiated on dialysis.  Assessment and plan: Progressive CKD New ESRD Patient had CKD 5 and was following up with nephrology as an outpatient.  He had left AV fistula created in April 2025 but had not been yet initiated on dialysis. 12/5, presented with uremic encephalopathy 12/6, started on dialysis on 12/6  12/8, patient required balloon angioplasty and stenting of the cephalic arc of the fistula by vascular surgery. 12/11, had complications with AV fistula again.  Vascular surgery placed a tunneled dialysis catheter. 12/12, underwent successful dialysis.  Has not had complication since then. Dialysis is schedule per nephrology. Outpatient dialysis arrangement in process.  Acute metabolic encephalopathy Altered mental status on admission was likely secondary to uremia in the setting of advanced CKD.   Patient was also on gabapentin which was stopped Seen by psychiatry as well. Initially required chemical and physical restraints. Mental status gradually improved with subsequent dialysis.  Mental status has been normal and stable for last several days now. Currently continued on Zoloft  25 mg daily, Haldol  2 mg IV every 6 hours PRN for agitation EKG 12/7 QTc 420 ms. If patient needs physical restraints, nursing staff have been clearly instructed to avoid putting tight restraints on the fistula arm.  SIRS Was hypothermic and bradycardic on admission Likely because he was out on the cold.  Was found on neighbor's driveway confused. No evidence of infection. Blood culture sent on admission did not show any growth. Empiric antibiotics have been discontinued. Recent Labs  Lab 05/06/24 1137 05/08/24 0333 05/09/24 0840  WBC 3.0* 3.6* 4.0   Hyperkalemia Potassium level improved with dialysis Recent Labs  Lab 05/04/24 1324 05/06/24 1137 05/08/24 0333 05/09/24 0840  K 4.0 4.2 4.5 4.4  PHOS  --  3.2  --  3.8   Essential hypertension Blood pressure controlled, continue current management. PTA meds- Coreg , amlodipine , Lasix  Currently continued on Coreg  25 mg twice daily and amlodipine  10 mg nightly, IV hydralazine  as needed  Type 2 diabetes mellitus Hypoglycemia A1c 4.3 on 04/28/24 PTA meds- Lantus  Was hypoglycemic on 12/6.  Lantus  was discontinued Currently blood sugar is consistently less than 100.  Continue SSI/Accu-Cheks. Recent Labs  Lab 05/09/24 1722 05/09/24 2011 05/10/24 0900 05/10/24 1314 05/10/24 1626  GLUCAP 143* 159* 91 134* 119*   HLD Lipitor 40 mg daily  Chronic macrocytic anemia Hemoglobin at baseline between 9 and 10.  Currently mostly between 8 and 9.  No active bleeding. Hemoglobin was low at  7.8 on last check on 12/13.  Repeat again tomorrow Recent Labs    12/31/23 0931 12/31/23 0937 01/28/24 1239 01/28/24 1241 02/25/24 1228 02/25/24 1236  04/07/24 1027 04/07/24 1031 04/30/24 1019 05/01/24 0755 05/02/24 0755 05/04/24 1324 05/06/24 1137 05/08/24 0333 05/09/24 0840  HGB  --    < >  --    < >  --    < >  --    < >  --    < > 8.1* 9.5* 7.8* 8.2* 8.1*  MCV  --   --   --   --   --   --   --    < >  --    < > 101.9*  --  100.0 101.5* 102.4*  VITAMINB12  --   --   --   --   --   --   --   --  234  --   --   --   --   --   --   FOLATE  --   --   --   --   --   --   --   --  5.1*  --   --   --   --   --   --   FERRITIN 128  --  262  --  282  --  237  --  293  --   --   --   --   --   --   TIBC 204*  --  211*  --  197*  --  195*  --  161*  --   --   --   --   --   --   IRON 70  --  72  --  85  --  65  --  61  --   --   --   --   --   --   RETICCTPCT  --   --   --   --   --   --   --   --  1.3  --   --   --   --   --   --    < > = values in this interval not displayed.   H/o gout Continue allopurinol .  Diabetic neuropathy Gabapentin on hold because of altered mentation.  Nephrology recommended against restarting it.  Overweight Body mass index is 29.66 kg/m. Patient has been advised to make an attempt to improve diet and exercise patterns to aid in weight loss.  Impaired mobility:  Seen by PT.  Recommend SNF. 12/17, had PT reevaluate him again for possibility of home health.  However, given his impaired cognition and fall risk, PT continue to recommend SNF. PT Follow up Rec: Skilled Nursing-Short Term Rehab (<3 Hours/Day)05/10/2024 1111    Goals of care   Code Status: Limited: Do not attempt resuscitation (DNR) -DNR-LIMITED -Do Not Intubate/DNI      DVT prophylaxis:  heparin  injection 5,000 Units Start: 04/28/24 0815 SCDs Start: 04/28/24 0759   Antimicrobials: None currently Fluid: None Consultants: Nephrology Family Communication: None at bedside  Status: Inpatient Level of care:  Med-Surg   Patient is from: Home Needs to continue in-hospital care: newly started on dialysis.  Outpatient chair arrangement in  process.  Pending SNF    Diet:  Diet Order             Diet renal with fluid restriction Fluid restriction: 1200 mL Fluid; Room service appropriate? Yes;  Fluid consistency: Thin  Diet effective now                   Scheduled Meds:  allopurinol   100 mg Oral Daily   amLODipine   10 mg Oral QPM   aspirin  EC  81 mg Oral Daily   atorvastatin   40 mg Oral Daily   calcitRIOL   0.25 mcg Oral Q M,W,F   carvedilol   25 mg Oral BID WC   Chlorhexidine  Gluconate Cloth  6 each Topical Q0600   darbepoetin (ARANESP ) injection - DIALYSIS  100 mcg Subcutaneous Q Sat-1800   ferrous sulfate   325 mg Oral Daily   heparin   5,000 Units Subcutaneous Q8H   insulin  aspart  0-5 Units Subcutaneous QHS   insulin  aspart  0-6 Units Subcutaneous TID WC   multivitamin with minerals  1 tablet Oral Daily   sertraline   25 mg Oral Daily   sodium chloride  flush  3 mL Intravenous Q12H    PRN meds: acetaminophen  **OR** acetaminophen , bisacodyl , haloperidol  lactate, hydrALAZINE , HYDROmorphone  (DILAUDID ) injection, ipratropium, ondansetron  **OR** ondansetron  (ZOFRAN ) IV, oxyCODONE , senna-docusate   Infusions:     Antimicrobials: Anti-infectives (From admission, onward)    Start     Dose/Rate Route Frequency Ordered Stop   04/29/24 0900  ceFEPIme  (MAXIPIME ) 2 g in sodium chloride  0.9 % 100 mL IVPB  Status:  Discontinued        2 g 200 mL/hr over 30 Minutes Intravenous Every 24 hours 04/28/24 0851 04/29/24 0847   04/28/24 0851  vancomycin  variable dose per unstable renal function (pharmacist dosing)  Status:  Discontinued         Does not apply See admin instructions 04/28/24 0851 04/29/24 0847   04/28/24 0830  ceFEPIme  (MAXIPIME ) 2 g in sodium chloride  0.9 % 100 mL IVPB        2 g 200 mL/hr over 30 Minutes Intravenous  Once 04/28/24 0820 04/28/24 0955   04/28/24 0830  metroNIDAZOLE  (FLAGYL ) IVPB 500 mg  Status:  Discontinued        500 mg 100 mL/hr over 60 Minutes Intravenous Every 12 hours 04/28/24 0820  04/29/24 0847   04/28/24 0830  vancomycin  (VANCOREADY) IVPB 2000 mg/400 mL        2,000 mg 200 mL/hr over 120 Minutes Intravenous  Once 04/28/24 0820 04/28/24 1208       Objective: Vitals:   05/10/24 0858 05/10/24 1628  BP: (!) 121/46 (!) 121/47  Pulse: 63 62  Resp: 18 18  Temp: 98.1 F (36.7 C) 97.9 F (36.6 C)  SpO2: 98% 100%    Intake/Output Summary (Last 24 hours) at 05/10/2024 1639 Last data filed at 05/10/2024 1625 Gross per 24 hour  Intake 240 ml  Output 400 ml  Net -160 ml   Filed Weights   05/08/24 0500 05/09/24 0447 05/09/24 0751  Weight: 94.1 kg 95 kg 99.2 kg   Weight change: 4.2 kg Body mass index is 29.66 kg/m.   Physical Exam: General exam: Pleasant, elderly African-American male.  Not in distress Skin: No rashes, lesions or ulcers. HEENT: Atraumatic, normocephalic, no obvious bleeding Lungs: Clear to auscultation bilaterally,  CVS: S1, S2, no murmur,   GI/Abd: Soft, nontender, nondistended, bowel sound present,   CNS: Alert, awake, oriented to place and person.  Slow to respond Psychiatry: Mood appropriate.  Cheerful. Extremities: No pedal edema, no calf tenderness,   Data Review: I have personally reviewed the laboratory data and studies available.  F/u labs ordered Unresulted Labs (From admission, onward)  None       Signed, Chapman Rota, MD [facility] 05/10/2024

## 2024-05-10 NOTE — Progress Notes (Signed)
 Occupational Therapy Treatment Patient Details Name: Kelly Dunn MRN: [id number] DOB: 06-11-1945 Today's Date: 05/10/2024   History of present illness 78 y.o. male admitted 12/5 with sepsis. CT (negative). Pt underwent US  and fluoroscopic guided right IJ tunneled dialysis catheter placement 12/11. PMHx: DM2, HTN, HLD, CKDV, L fistula in April 2025.   OT comments  Pt presented in bed and reported how they are doing better. However, they were living alone using a SPC for mobility in/out of the home and reported using the walls to balance. He reported he has two daughters which one works and the other is on disability and uses a cane for mobility. Prior, to coming into ED he reported conflicting information about going out/driving to get items he needs but then also family dropping off meals.   At this time he could not report why he came into the hospital and/or date. At this time scored a 6 on the Short Blessed Test which indicates questionable Impairment. He then engaged in self care tasks at the sink level with CGA and use of RW and requested to wait for any further ambulation as was fatigued. Patient will benefit from continued inpatient follow up therapy, <3 hours/day.       If plan is discharge home, recommend the following:  A lot of help with bathing/dressing/bathroom;Assistance with cooking/housework;Direct supervision/assist for medications management;Direct supervision/assist for financial management;Assist for transportation;Help with stairs or ramp for entrance;Supervision due to cognitive status;A little help with walking and/or transfers   Equipment Recommendations   (RW, may decline transfer bench)    Recommendations for Other Services      Precautions / Restrictions Precautions Precautions: Fall Recall of Precautions/Restrictions: Intact Precaution/Restrictions Comments: delirium prevention precautions; LUE AV fistula Restrictions Weight Bearing Restrictions Per  Provider Order: No       Mobility Bed Mobility Overal bed mobility: Modified Independent Bed Mobility: Supine to Sit, Sit to Supine     Supine to sit: Modified independent (Device/Increase time) Sit to supine: Modified independent (Device/Increase time)   General bed mobility comments: HOB elevated throughout the session    Transfers Overall transfer level: Needs assistance Equipment used: Rolling walker (2 wheels) Transfers: Sit to/from Stand Sit to Stand: Supervision, Contact guard assist           General transfer comment: cues on how to use walker     Balance Overall balance assessment: Needs assistance Sitting-balance support: Feet supported Sitting balance-Leahy Scale: Good     Standing balance support: Bilateral upper extremity supported, Single extremity supported Standing balance-Leahy Scale: Fair Standing balance comment: Pt dependent on RW for ambulation                           ADL either performed or assessed with clinical judgement   ADL Overall ADL's : Needs assistance/impaired Eating/Feeding: Set up   Grooming: Wash/dry face;Wash/dry hands;Oral care;Contact guard assist Grooming Details (indicate cue type and reason): requires atleast unilateral support and set up Upper Body Bathing: Set up;Sitting   Lower Body Bathing: Contact guard assist;Sit to/from stand   Upper Body Dressing : Set up;Sitting   Lower Body Dressing: Contact guard assist;Sit to/from stand   Toilet Transfer: Contact guard assist;Cueing for safety;Cueing for sequencing;Rolling walker (2 wheels)   Toileting- Clothing Manipulation and Hygiene: Contact guard assist;Minimal assistance;Sit to/from stand       Functional mobility during ADLs: Contact guard assist;Minimal assistance;Rolling walker (2 wheels);Cueing for sequencing;Cueing for safety  Extremity/Trunk Assessment Upper Extremity Assessment Upper Extremity Assessment: Generalized weakness   Lower  Extremity Assessment Lower Extremity Assessment: Defer to PT evaluation        Vision       Perception     Praxis     Communication Communication Communication: Impaired Factors Affecting Communication: Reduced clarity of speech   Cognition Arousal: Alert Behavior During Therapy: WFL for tasks assessed/performed Cognition: Cognition impaired   Orientation impairments: Situation, Time (could report it was mid Decemebr but not the exact date) Awareness: Online awareness impaired Memory impairment (select all impairments): Short-term memory, Working memory Attention impairment (select first level of impairment): Alternating attention Executive functioning impairment (select all impairments): Problem solving OT - Cognition Comments: Pt could not recall why they came into ED, date and scored a 6 on the Short Blessed Test which indicates questionable Impairment                 Following commands: Intact        Cueing   Cueing Techniques: Verbal cues, Gestural cues  Exercises      Shoulder Instructions       General Comments No dizziness/lightheadedness noted from pt during activity.    Pertinent Vitals/ Pain       Pain Assessment Pain Assessment: No/denies pain Pain Score: 0-No pain Faces Pain Scale: No hurt  Home Living                                          Prior Functioning/Environment              Frequency  Min 2X/week        Progress Toward Goals  OT Goals(current goals can now be found in the care plan section)  Progress towards OT goals: Progressing toward goals     Plan      Co-evaluation                 AM-PAC OT 6 Clicks Daily Activity     Outcome Measure   Help from another person eating meals?: None Help from another person taking care of personal grooming?: None Help from another person toileting, which includes using toliet, bedpan, or urinal?: A Little Help from another person bathing  (including washing, rinsing, drying)?: A Little Help from another person to put on and taking off regular upper body clothing?: A Little Help from another person to put on and taking off regular lower body clothing?: A Little 6 Click Score: 20    End of Session Equipment Utilized During Treatment: Gait belt;Rolling walker (2 wheels)  OT Visit Diagnosis: Unsteadiness on feet (R26.81);Other abnormalities of gait and mobility (R26.89);Repeated falls (R29.6);Other symptoms and signs involving cognitive function;History of falling (Z91.81)   Activity Tolerance Patient tolerated treatment well   Patient Left in bed;with call bell/phone within reach;with bed alarm set   Nurse Communication Mobility status        Time: 8864-8798 OT Time Calculation (min): 26 min  Charges: OT General Charges $OT Visit: 1 Visit OT Treatments $Self Care/Home Management : 23-37 mins  Warrick POUR OTR/L  Acute Rehab Services  [phone] office number   Warrick Berber 05/10/2024, 12:10 PM

## 2024-05-11 DIAGNOSIS — R4182 Altered mental status, unspecified: Secondary | ICD-10-CM | POA: Diagnosis not present

## 2024-05-11 LAB — GLUCOSE, CAPILLARY
Glucose-Capillary: 160 mg/dL — ABNORMAL HIGH (ref 70–99)
Glucose-Capillary: 119 mg/dL — ABNORMAL HIGH (ref 70–99)

## 2024-05-11 MED ORDER — IPRATROPIUM BROMIDE 0.02 % IN SOLN: 0.5000 mg | Freq: Four times a day (QID) | RESPIRATORY_TRACT | Status: AC | PRN

## 2024-05-11 MED ORDER — DARBEPOETIN ALFA 100 MCG/0.5ML IJ SOSY: 100.0000 ug | PREFILLED_SYRINGE | INTRAMUSCULAR | Status: DC

## 2024-05-11 MED ORDER — ADULT MULTIVITAMIN W/MINERALS CH: 1.0000 | ORAL_TABLET | Freq: Every day | ORAL | Status: AC

## 2024-05-11 MED ORDER — OXYCODONE HCL 5 MG PO TABS: 5.0000 mg | ORAL_TABLET | Freq: Four times a day (QID) | ORAL | 0 refills | Status: DC | PRN

## 2024-05-11 NOTE — Progress Notes (Signed)
 [facility] Progress Note   Assessment/ Plan:   # CKD stage V with progression to ESRD  - With uremic symptoms/AMS.  HD started on 12/6 via his existing LUE AVF - Plan for HD on 12/8-- infiltration - VVS with fgram 12/8, greatly appreciate - HD 12/9, next 12/11--> not successful [facility] 12/11 with VVS, appreciate HD 12/12, 12/13 Good T/b in the access- I am thinking that maybe just needs to rest for now and to attempt cannulation protocol with cannulation team as OP - HD on TTS schedule for now - CLIP'ed to [facility] TTS 10:40am chair time, can start tomorrow off schedule (appreciate renal navigator's assistance). Will be Mon, Wed, Sat next week (holiday schedule). Hold HD today since looking to get him into SNF today   # AMS - sundowning vs gaba toxicity vs uremic - getting better - per primary service   # Hyperkalemia, resolved  - managing with HD - continue renal diabetic diet      # HTN  - reasonably well controlled -UF as tolerated with HD   # Anemia of CKD  - last retacrit  10,000 units 04/24/24 - Aranesp  100 mg qsat. Iron replete -hgb 8.1   # Metabolic bone disease - calcitriol  with dialysis -phos acceptable  Dispo: SNF bed secured  Subjective:    Seen and examined bedside. No complaints   Objective:   BP (!) 122/47 (BP Location: Right Arm)   Pulse 62   Temp 98.2 F (36.8 C) (Oral)   Resp 18   Ht 6' (1.829 m)   Wt 99.2 kg   SpO2 98%   BMI 29.66 kg/m   Physical Exam: Gen: NAD CVS:RRR Resp: normal wob, unlabored Jai:dnqu Ext: no sig edema b/l LEs ACCESS: L AVF + T/B (with surrounding ecchymosis-soft), R TDC  Labs: BMET Recent Labs  Lab 05/04/24 1324 05/06/24 1137 05/08/24 0333 05/09/24 0840  NA 141 140 142 138  K 4.0 4.2 4.5 4.4  CL 105 104 105 102  CO2  --  29 30 29   GLUCOSE 90 86 72 142*  BUN 46* 31* 44* 58*  CREATININE 5.70* 4.59* 5.95* 6.58*  CALCIUM   --  8.6* 8.7* 8.7*  PHOS  --  3.2  --  3.8   CBC Recent Labs   Lab 05/04/24 1324 05/06/24 1137 05/08/24 0333 05/09/24 0840  WBC  --  3.0* 3.6* 4.0  NEUTROABS  --   --  1.6*  --   HGB 9.5* 7.8* 8.2* 8.1*  HCT 28.0* 24.7* 26.6* 25.9*  MCV  --  100.0 101.5* 102.4*  PLT  --  86* 95* 103*      Medications:     allopurinol   100 mg Oral Daily   amLODipine   10 mg Oral QPM   aspirin  EC  81 mg Oral Daily   atorvastatin   40 mg Oral Daily   calcitRIOL   0.25 mcg Oral Q M,W,F   carvedilol   25 mg Oral BID WC   Chlorhexidine  Gluconate Cloth  6 each Topical Q0600   darbepoetin (ARANESP ) injection - DIALYSIS  100 mcg Subcutaneous Q Sat-1800   ferrous sulfate   325 mg Oral Daily   heparin   5,000 Units Subcutaneous Q8H   insulin  aspart  0-5 Units Subcutaneous QHS   insulin  aspart  0-6 Units Subcutaneous TID WC   multivitamin with minerals  1 tablet Oral Daily   sertraline   25 mg Oral Daily   sodium chloride  flush  3 mL Intravenous Q12H  Ephriam Stank, MD [facility] 05/11/2024, 11:36 AM

## 2024-05-11 NOTE — TOC Progression Note (Signed)
 Transition of Care [facility]) - Progression Note    Patient Details  Name: Kelly Dunn MRN: [id number] Date of Birth: 08-24-1945  [facility] CM/SW Contact  Lauraine FORBES Saa, LCSWA Phone Number: 05/11/2024, 10:48 AM  Clinical Narrative:     10:49 AM Patient's SNF insurance authorization was approved and is valid 05/11/2024-05/15/2024. [facility]F confirmed they could admit patient today. Medical team made aware.  Expected Discharge Plan: Skilled Nursing Facility Barriers to Discharge: Continued Medical Work up               Expected Discharge Plan and Services In-house Referral: Clinical Social Work   Post Acute Care Choice: Skilled Nursing Facility Living arrangements for the past 2 months: Single Family Home                                       Social Drivers of Health (SDOH) Interventions SDOH Screenings   Tobacco Use: Medium Risk (05/04/2024)    Readmission Risk Interventions     No data to display

## 2024-05-11 NOTE — Plan of Care (Signed)
°  Problem: Education: Goal: Ability to describe self-care measures that may prevent or decrease complications (Diabetes Survival Skills Education) will improve Outcome: Progressing Goal: Individualized Educational Video(s) Outcome: Progressing   Problem: Coping: Goal: Ability to adjust to condition or change in health will improve Outcome: Progressing   Problem: Fluid Volume: Goal: Ability to maintain a balanced intake and output will improve Outcome: Progressing   Problem: Health Behavior/Discharge Planning: Goal: Ability to identify and utilize available resources and services will improve Outcome: Progressing Goal: Ability to manage health-related needs will improve Outcome: Progressing   Problem: Metabolic: Goal: Ability to maintain appropriate glucose levels will improve Outcome: Progressing   Problem: Nutritional: Goal: Maintenance of adequate nutrition will improve Outcome: Progressing Goal: Progress toward achieving an optimal weight will improve Outcome: Progressing   Problem: Skin Integrity: Goal: Risk for impaired skin integrity will decrease Outcome: Progressing   Problem: Tissue Perfusion: Goal: Adequacy of tissue perfusion will improve Outcome: Progressing   Problem: Fluid Volume: Goal: Hemodynamic stability will improve Outcome: Progressing   Problem: Clinical Measurements: Goal: Diagnostic test results will improve Outcome: Progressing Goal: Signs and symptoms of infection will decrease Outcome: Progressing   Problem: Respiratory: Goal: Ability to maintain adequate ventilation will improve Outcome: Progressing   Problem: Education: Goal: Knowledge of General Education information will improve Description: Including pain rating scale, medication(s)/side effects and non-pharmacologic comfort measures Outcome: Progressing   Problem: Health Behavior/Discharge Planning: Goal: Ability to manage health-related needs will improve Outcome:  Progressing   Problem: Clinical Measurements: Goal: Ability to maintain clinical measurements within normal limits will improve Outcome: Progressing Goal: Will remain free from infection Outcome: Progressing Goal: Diagnostic test results will improve Outcome: Progressing Goal: Respiratory complications will improve Outcome: Progressing Goal: Cardiovascular complication will be avoided Outcome: Progressing   Problem: Activity: Goal: Risk for activity intolerance will decrease Outcome: Progressing   Problem: Nutrition: Goal: Adequate nutrition will be maintained Outcome: Progressing   Problem: Coping: Goal: Level of anxiety will decrease Outcome: Progressing   Problem: Elimination: Goal: Will not experience complications related to bowel motility Outcome: Progressing Goal: Will not experience complications related to urinary retention Outcome: Progressing   Problem: Pain Managment: Goal: General experience of comfort will improve and/or be controlled Outcome: Progressing   Problem: Safety: Goal: Ability to remain free from injury will improve Outcome: Progressing   Problem: Skin Integrity: Goal: Risk for impaired skin integrity will decrease Outcome: Progressing   Problem: Safety: Goal: Non-violent Restraint(s) Outcome: Progressing   Problem: Education: Goal: Knowledge of disease and its progression will improve Outcome: Progressing Goal: Individualized Educational Video(s) Outcome: Progressing   Problem: Fluid Volume: Goal: Compliance with measures to maintain balanced fluid volume will improve Outcome: Progressing   Problem: Health Behavior/Discharge Planning: Goal: Ability to manage health-related needs will improve Outcome: Progressing   Problem: Nutritional: Goal: Ability to make healthy dietary choices will improve Outcome: Progressing   Problem: Clinical Measurements: Goal: Complications related to the disease process, condition or treatment  will be avoided or minimized Outcome: Progressing

## 2024-05-11 NOTE — Plan of Care (Signed)
°  Problem: Education: Goal: Ability to describe self-care measures that may prevent or decrease complications (Diabetes Survival Skills Education) will improve Outcome: Progressing Goal: Individualized Educational Video(s) Outcome: Progressing   Problem: Fluid Volume: Goal: Ability to maintain a balanced intake and output will improve Outcome: Progressing   Problem: Health Behavior/Discharge Planning: Goal: Ability to identify and utilize available resources and services will improve Outcome: Progressing Goal: Ability to manage health-related needs will improve Outcome: Progressing   Problem: Metabolic: Goal: Ability to maintain appropriate glucose levels will improve Outcome: Progressing   Problem: Tissue Perfusion: Goal: Adequacy of tissue perfusion will improve Outcome: Progressing   Problem: Skin Integrity: Goal: Risk for impaired skin integrity will decrease Outcome: Progressing   Problem: Clinical Measurements: Goal: Diagnostic test results will improve Outcome: Progressing Goal: Signs and symptoms of infection will decrease Outcome: Progressing   Problem: Respiratory: Goal: Ability to maintain adequate ventilation will improve Outcome: Progressing

## 2024-05-11 NOTE — Progress Notes (Addendum)
 Spoke to [facility] this morning. Since pt was unable to start today at clinic, clinic is unable to start pt on Saturday due to staffing. Clinic states that if pt happens to d/c today, they could possibly treat pt tomorrow (if needed) or pt can start at clinic on Monday. For the week of Christmas and New Years, pt's schedule will be Mon, Wed, Sat due to holidays on Thursdays and clinic is closed Christmas Day and New Years Day. Pt's schedule will be TTS after the holidays. Update provided to attending, nephrologist, and CSW so plans can be made accordingly and snf can be made aware of pt's HD schedule for the next 2 week. Will assist as needed.   Randine Mungo Dialysis Navigator [phone]  Addendum at 11:48 am: Pt to d/c to snf today. Pt can start at [facility]. For tomorrow only, pt will need to arrive at 11:00 am for paperwork and then treatment will start at 12:00. For the next 2 weeks due to holidays, pt will be Mon, Wed Sat. Pt will need to arrive at 10:20 for 10:40 start time. After the holidays, pt will be TTS 10:20 arrival for 10:40 start time. Above HD information added to AVS. CSW provided this info to provide to snf. Navigator met with pt at bedside to discuss the above details and to provide schedule letter. Pt agreeable to plans and aware CSW to communicate HD arrangements to snf. Contacted renal NP to request that orders be sent to clinic for tomorrow's treatment. [facility] aware pt will d/c to snf today and start tomorrow as discussed. Clinic also provided snf name. Nephrologist and attending aware of the above details as well.

## 2024-05-11 NOTE — TOC Transition Note (Signed)
 Transition of Care [facility]) - Discharge Note   Patient Details  Name: Kelly Dunn MRN: [id number] Date of Birth: [birth year]  Transition of Care [facility]) CM/SW Contact:  Lauraine FORBES Saa, LCSWA Phone Number: 05/11/2024, 1:03 PM   Clinical Narrative:     Patient will DC to: [facility] Anticipated DC date: 05/09/2024 Family notified: Sharlet Pinal; Daughter; [phone] Transport by: ROME   Per MD patient ready for DC to [facility] SNF. RN to call report prior to discharge [phone]). RN, patient, and facility notified of DC (CSW attempted to inform patient's daughters, but there were no responses and voicemail's were left). Discharge Summary and FL2 sent to facility. DC packet on chart. Ambulance transport requested for patient at 12:59.  CSW will sign off for now as social work intervention is no longer needed. Please consult us  again if new needs arise.    Final next level of care: Skilled Nursing Facility Barriers to Discharge: Barriers Resolved   Patient Goals and CMS Choice Patient states their goals for this hospitalization and ongoing recovery are:: SNF CMS Medicare.gov Compare Post Acute Care list provided to:: Patient Choice offered to / list presented to : Patient, Adult Children [place] ownership interest in [facility]provided to:: Patient    Discharge Placement              Patient chooses bed at: [facility] Patient to be transferred to facility by: [facility] Name of family member notified: Sharlet Pinal; Daughter; [phone] Patient and family notified of of transfer: 05/11/24  Discharge Plan and Services Additional resources added to the After Visit Summary for   In-house Referral: Clinical Social Work   Post Acute Care Choice: Skilled Nursing Facility                               Social Drivers of Health (SDOH) Interventions SDOH Screenings   Tobacco Use: Medium Risk (05/04/2024)      Readmission Risk Interventions     No data to display

## 2024-05-11 NOTE — Plan of Care (Signed)
 Problem: Education: Goal: Ability to describe self-care measures that may prevent or decrease complications (Diabetes Survival Skills Education) will improve 05/11/2024 1433 by Raymond Tedi CROME, RN Outcome: Adequate for Discharge 05/11/2024 1433 by Raymond Tedi CROME, RN Outcome: Progressing Goal: Individualized Educational Video(s) 05/11/2024 1433 by Raymond Tedi CROME, RN Outcome: Adequate for Discharge 05/11/2024 1433 by Raymond Tedi CROME, RN Outcome: Progressing   Problem: Coping: Goal: Ability to adjust to condition or change in health will improve 05/11/2024 1433 by Raymond Tedi CROME, RN Outcome: Adequate for Discharge 05/11/2024 1433 by Raymond Tedi CROME, RN Outcome: Progressing   Problem: Fluid Volume: Goal: Ability to maintain a balanced intake and output will improve 05/11/2024 1433 by Raymond Tedi CROME, RN Outcome: Adequate for Discharge 05/11/2024 1433 by Raymond Tedi CROME, RN Outcome: Progressing   Problem: Health Behavior/Discharge Planning: Goal: Ability to identify and utilize available resources and services will improve 05/11/2024 1433 by Raymond Tedi CROME, RN Outcome: Adequate for Discharge 05/11/2024 1433 by Raymond Tedi CROME, RN Outcome: Progressing Goal: Ability to manage health-related needs will improve 05/11/2024 1433 by Raymond Tedi CROME, RN Outcome: Adequate for Discharge 05/11/2024 1433 by Raymond Tedi CROME, RN Outcome: Progressing   Problem: Metabolic: Goal: Ability to maintain appropriate glucose levels will improve 05/11/2024 1433 by Raymond Tedi CROME, RN Outcome: Adequate for Discharge 05/11/2024 1433 by Raymond Tedi CROME, RN Outcome: Progressing   Problem: Nutritional: Goal: Maintenance of adequate nutrition will improve 05/11/2024 1433 by Raymond Tedi CROME, RN Outcome: Adequate for Discharge 05/11/2024 1433 by Raymond Tedi CROME, RN Outcome: Progressing Goal: Progress  toward achieving an optimal weight will improve 05/11/2024 1433 by Raymond Tedi CROME, RN Outcome: Adequate for Discharge 05/11/2024 1433 by Raymond Tedi CROME, RN Outcome: Progressing   Problem: Skin Integrity: Goal: Risk for impaired skin integrity will decrease 05/11/2024 1433 by Raymond Tedi CROME, RN Outcome: Adequate for Discharge 05/11/2024 1433 by Raymond Tedi CROME, RN Outcome: Progressing   Problem: Tissue Perfusion: Goal: Adequacy of tissue perfusion will improve 05/11/2024 1433 by Raymond Tedi CROME, RN Outcome: Adequate for Discharge 05/11/2024 1433 by Raymond Tedi CROME, RN Outcome: Progressing   Problem: Fluid Volume: Goal: Hemodynamic stability will improve 05/11/2024 1433 by Raymond Tedi CROME, RN Outcome: Adequate for Discharge 05/11/2024 1433 by Raymond Tedi CROME, RN Outcome: Progressing   Problem: Clinical Measurements: Goal: Diagnostic test results will improve 05/11/2024 1433 by Raymond Tedi CROME, RN Outcome: Adequate for Discharge 05/11/2024 1433 by Raymond Tedi CROME, RN Outcome: Progressing Goal: Signs and symptoms of infection will decrease 05/11/2024 1433 by Raymond Tedi CROME, RN Outcome: Adequate for Discharge 05/11/2024 1433 by Raymond Tedi CROME, RN Outcome: Progressing   Problem: Respiratory: Goal: Ability to maintain adequate ventilation will improve 05/11/2024 1433 by Raymond Tedi CROME, RN Outcome: Adequate for Discharge 05/11/2024 1433 by Raymond Tedi CROME, RN Outcome: Progressing   Problem: Education: Goal: Knowledge of General Education information will improve Description: Including pain rating scale, medication(s)/side effects and non-pharmacologic comfort measures 05/11/2024 1433 by Raymond Tedi CROME, RN Outcome: Adequate for Discharge 05/11/2024 1433 by Raymond Tedi CROME, RN Outcome: Progressing   Problem: Health Behavior/Discharge Planning: Goal: Ability to manage  health-related needs will improve 05/11/2024 1433 by Raymond Tedi CROME, RN Outcome: Adequate for Discharge 05/11/2024 1433 by Raymond Tedi CROME, RN Outcome: Progressing   Problem: Clinical Measurements: Goal: Ability to maintain clinical measurements within normal limits will improve 05/11/2024 1433 by Jia Mohamed, Tedi CROME, RN Outcome: Adequate for Discharge 05/11/2024 1433 by Raymond Tedi CROME, RN Outcome: Progressing Goal: Will remain free  from infection 05/11/2024 1433 by Raymond Tedi CROME, RN Outcome: Adequate for Discharge 05/11/2024 1433 by Raymond Tedi CROME, RN Outcome: Progressing Goal: Diagnostic test results will improve 05/11/2024 1433 by Raymond Tedi CROME, RN Outcome: Adequate for Discharge 05/11/2024 1433 by Raymond Tedi CROME, RN Outcome: Progressing Goal: Respiratory complications will improve 05/11/2024 1433 by Raymond Tedi CROME, RN Outcome: Adequate for Discharge 05/11/2024 1433 by Raymond Tedi CROME, RN Outcome: Progressing Goal: Cardiovascular complication will be avoided 05/11/2024 1433 by Raymond Tedi CROME, RN Outcome: Adequate for Discharge 05/11/2024 1433 by Raymond Tedi CROME, RN Outcome: Progressing   Problem: Activity: Goal: Risk for activity intolerance will decrease 05/11/2024 1433 by Raymond Tedi CROME, RN Outcome: Adequate for Discharge 05/11/2024 1433 by Raymond Tedi CROME, RN Outcome: Progressing   Problem: Nutrition: Goal: Adequate nutrition will be maintained 05/11/2024 1433 by Raymond Tedi CROME, RN Outcome: Adequate for Discharge 05/11/2024 1433 by Raymond Tedi CROME, RN Outcome: Progressing   Problem: Coping: Goal: Level of anxiety will decrease 05/11/2024 1433 by Raymond Tedi CROME, RN Outcome: Adequate for Discharge 05/11/2024 1433 by Raymond Tedi CROME, RN Outcome: Progressing   Problem: Elimination: Goal: Will not experience complications related to bowel  motility 05/11/2024 1433 by Raymond Tedi CROME, RN Outcome: Adequate for Discharge 05/11/2024 1433 by Raymond Tedi CROME, RN Outcome: Progressing Goal: Will not experience complications related to urinary retention 05/11/2024 1433 by Raymond Tedi CROME, RN Outcome: Adequate for Discharge 05/11/2024 1433 by Raymond Tedi CROME, RN Outcome: Progressing   Problem: Pain Managment: Goal: General experience of comfort will improve and/or be controlled 05/11/2024 1433 by Raymond Tedi CROME, RN Outcome: Adequate for Discharge 05/11/2024 1433 by Raymond Tedi CROME, RN Outcome: Progressing   Problem: Safety: Goal: Ability to remain free from injury will improve 05/11/2024 1433 by Iren Whipp, Tedi CROME, RN Outcome: Adequate for Discharge 05/11/2024 1433 by Raymond Tedi CROME, RN Outcome: Progressing   Problem: Skin Integrity: Goal: Risk for impaired skin integrity will decrease 05/11/2024 1433 by Raymond Tedi CROME, RN Outcome: Adequate for Discharge 05/11/2024 1433 by Raymond Tedi CROME, RN Outcome: Progressing   Problem: Safety: Goal: Non-violent Restraint(s) 05/11/2024 1433 by Raymond Tedi CROME, RN Outcome: Adequate for Discharge 05/11/2024 1433 by Raymond Tedi CROME, RN Outcome: Progressing   Problem: Acute Rehab OT Goals (only OT should resolve) Goal: Pt. Will Perform Grooming Outcome: Adequate for Discharge Goal: Pt. Will Perform Lower Body Bathing Outcome: Adequate for Discharge Goal: Pt. Will Perform Lower Body Dressing Outcome: Adequate for Discharge Goal: Pt. Will Transfer To Toilet Outcome: Adequate for Discharge Goal: Pt. Will Perform Toileting-Clothing Manipulation Outcome: Adequate for Discharge   Problem: Education: Goal: Knowledge of disease and its progression will improve 05/11/2024 1433 by Raymond Tedi CROME, RN Outcome: Adequate for Discharge 05/11/2024 1433 by Raymond Tedi CROME, RN Outcome: Progressing Goal:  Individualized Educational Video(s) 05/11/2024 1433 by Raymond Tedi CROME, RN Outcome: Adequate for Discharge 05/11/2024 1433 by Raymond Tedi CROME, RN Outcome: Progressing   Problem: Fluid Volume: Goal: Compliance with measures to maintain balanced fluid volume will improve 05/11/2024 1433 by Callan Yontz, Tedi CROME, RN Outcome: Adequate for Discharge 05/11/2024 1433 by Raymond Tedi CROME, RN Outcome: Progressing   Problem: Health Behavior/Discharge Planning: Goal: Ability to manage health-related needs will improve 05/11/2024 1433 by Raymond Tedi CROME, RN Outcome: Adequate for Discharge 05/11/2024 1433 by Raymond Tedi CROME, RN Outcome: Progressing   Problem: Nutritional: Goal: Ability to make healthy dietary choices will improve 05/11/2024 1433 by Raymond Tedi CROME, RN Outcome: Adequate for Discharge 05/11/2024 1433 by Raymond Tedi  L, RN Outcome: Progressing   Problem: Clinical Measurements: Goal: Complications related to the disease process, condition or treatment will be avoided or minimized 05/11/2024 1433 by Samanthan Dugo, Tedi CROME, RN Outcome: Adequate for Discharge 05/11/2024 1433 by Raymond Tedi CROME, RN Outcome: Progressing   Problem: Acute Rehab PT Goals(only PT should resolve) Goal: Patient Will Transfer Sit To/From Stand Outcome: Adequate for Discharge Goal: Pt Will Transfer Bed To Chair/Chair To Bed Outcome: Adequate for Discharge Goal: Pt Will Ambulate Outcome: Adequate for Discharge Goal: Pt Will Go Up/Down Stairs Outcome: Adequate for Discharge

## 2024-05-11 NOTE — Discharge Summary (Signed)
 Physician Discharge Summary  Kelly Dunn FMW:[id number] DOB: November 08, 1945 DOA: 04/28/2024  PCP: Regino Slater, MD  Admit date: 04/28/2024 Discharge date: 05/11/2024  Admitted from: Home Discharge disposition: SNF  Subjective: Patient was seen and examined this morning. Lying on bed.  Not in distress. Afebrile, hemodynamically stable, breathing on room air Blood sugar level in target range  Brief narrative: Kelly Dunn is a 78 y.o. male with PMH significant for DM2, HTN, HLD, CKD 5, gout, neuropathy.    12/5, patient was brought from home with a progressive agitation, confusion.  Per family, he had similar episode earlier this year.  He has been progressively getting confused-with delusion, hallucination that someone was in a house.  911 was called, subsequently he was found in the neighbors driveway, confused, agitated, on the ground and was brought to the ED   Upon arrival to ED, he was hypothermic with bradycardia  BMP showed hyperkalemia and elevated creatinine CT head unremarkable  Admitted to [facility] for uremic encephalopathy Multiple services including nephrology, palliative care and psychiatry were consulted  From the next day 12/6 patient was initiated on dialysis.  Hospital course: Progressive CKD New ESRD Patient had CKD 5 and was following up with nephrology as an outpatient.  He had left AV fistula created in April 2025 but had not been yet initiated on dialysis. 12/5, presented with uremic encephalopathy 12/6, started on dialysis on 12/6  12/8, patient required balloon angioplasty and stenting of the cephalic arc of the fistula by vascular surgery. 12/11, had complications with AV fistula again.  Vascular surgery placed a tunneled dialysis catheter. 12/12, underwent successful dialysis.  Has not had complication since then. Dialysis is schedule per nephrology. Outpatient dialysis arrangement per social worker and dialysis nurse navigator.  Acute  metabolic encephalopathy Altered mental status on admission was likely secondary to uremia in the setting of advanced CKD.  Patient was also on gabapentin which was stopped Seen by psychiatry as well. Initially required chemical and physical restraints. Mental status gradually improved with subsequent dialysis.  Mental status has been normal and stable for last several days now. Currently continued on Zoloft  25 mg daily  SIRS Was hypothermic and bradycardic on admission Likely because he was out on the cold.  Was found on neighbor's driveway confused. No evidence of infection. Blood culture sent on admission did not show any growth. Empiric antibiotics have been discontinued. Recent Labs  Lab 05/06/24 1137 05/08/24 0333 05/09/24 0840  WBC 3.0* 3.6* 4.0   Hyperkalemia Potassium level improved with dialysis Recent Labs  Lab 05/04/24 1324 05/06/24 1137 05/08/24 0333 05/09/24 0840  K 4.0 4.2 4.5 4.4  PHOS  --  3.2  --  3.8   Essential hypertension Blood pressure controlled, continue current management. PTA meds- Coreg , amlodipine , Lasix  Currently continued on Coreg  25 mg twice daily and amlodipine  10 mg nightly  Type 2 diabetes mellitus Hypoglycemia A1c 4.3 on 04/28/24 PTA meds- Lantus  Was hypoglycemic on 12/6.  Lantus  was discontinued Currently blood sugar is consistently less than 100.  Continue SSI/Accu-Cheks. Recent Labs  Lab 05/10/24 0900 05/10/24 1314 05/10/24 1626 05/10/24 2014 05/11/24 0800  GLUCAP 91 134* 119* 171* 160*   HLD Lipitor 40 mg daily  Chronic macrocytic anemia Hemoglobin at baseline between 9 and 10.  Currently hemoglobin is mostly between 8 and 9.  No active bleeding. Recent Labs    12/31/23 0931 12/31/23 9062 01/28/24 1239 01/28/24 1241 02/25/24 1228 02/25/24 1236 04/07/24 1027 04/07/24 1031 04/30/24 1019 05/01/24 0755 05/02/24 9244  05/04/24 1324 05/06/24 1137 05/08/24 0333 05/09/24 0840  HGB  --    < >  --    < >  --    < >   --    < >  --    < > 8.1* 9.5* 7.8* 8.2* 8.1*  MCV  --   --   --   --   --   --   --    < >  --    < > 101.9*  --  100.0 101.5* 102.4*  VITAMINB12  --   --   --   --   --   --   --   --  234  --   --   --   --   --   --   FOLATE  --   --   --   --   --   --   --   --  5.1*  --   --   --   --   --   --   FERRITIN 128  --  262  --  282  --  237  --  293  --   --   --   --   --   --   TIBC 204*  --  211*  --  197*  --  195*  --  161*  --   --   --   --   --   --   IRON 70  --  72  --  85  --  65  --  61  --   --   --   --   --   --   RETICCTPCT  --   --   --   --   --   --   --   --  1.3  --   --   --   --   --   --    < > = values in this interval not displayed.   H/o gout Continue allopurinol .  Diabetic neuropathy Gabapentin on hold because of altered mentation.  Nephrology recommended against restarting it.  Overweight Body mass index is 29.66 kg/m. Patient has been advised to make an attempt to improve diet and exercise patterns to aid in weight loss.  Impaired mobility:  Seen by PT.  Recommend SNF. 12/17, had PT reevaluate him again for possibility of home health.  However, given his impaired cognition and fall risk, PT continue to recommend SNF.   Goals of care   Code Status: Limited: Do not attempt resuscitation (DNR) -DNR-LIMITED -Do Not Intubate/DNI    Diet:  Diet Order             Diet renal with fluid restriction           Diet renal with fluid restriction Fluid restriction: 1200 mL Fluid; Room service appropriate? Yes; Fluid consistency: Thin  Diet effective now                   Nutritional status:  Body mass index is 29.66 kg/m.       Wounds:  - Wound 05/04/24 1244 Surgical Closed Surgical Incision Chest Right;Upper (Active)  Date First Assessed/Time First Assessed: 05/04/24 1244   Primary Wound Type: Surgical  Secondary Wound Type - Surgical: Closed Surgical Incision  Location: Chest  Location Orientation: Right;Upper  Wound Description (Comments): Also  Covaderm from kit    Assessments 05/04/2024  2:35 PM  05/10/2024  8:00 PM  Site / Wound Assessment Dressing in place / Unable to assess Dressing in place / Unable to assess  Drainage Amount None None  Dressing Type Gauze (Comment) Foam - Lift dressing to assess site every shift  Dressing Status Clean, Dry, Intact Clean, Dry, Intact     No associated orders.    Discharge Medications:   Allergies as of 05/11/2024       Reactions   Nsaids Other (See Comments)   Contraindication due to CKD    Sertraline  Other (See Comments)   Hallucinations         Medication List     STOP taking these medications    colchicine  0.6 MG tablet   furosemide  40 MG tablet Commonly known as: LASIX    gabapentin 100 MG capsule Commonly known as: NEURONTIN   insulin  glargine 100 UNIT/ML injection Commonly known as: LANTUS    sodium bicarbonate 650 MG tablet       TAKE these medications    acetaminophen  325 MG tablet Commonly known as: TYLENOL  Take 2 tablets (650 mg total) by mouth every 6 (six) hours as needed for mild pain (pain score 1-3) (or Fever >/= 101).   allopurinol  100 MG tablet Commonly known as: ZYLOPRIM  Take 1 tablet (100 mg total) by mouth daily. Start taking on: May 12, 2024 What changed: how much to take   amLODipine  10 MG tablet Commonly known as: NORVASC  Take 10 mg by mouth daily.   aspirin  81 MG tablet Take 81 mg by mouth daily.   atorvastatin  40 MG tablet Commonly known as: LIPITOR Take 40 mg by mouth daily.   bisacodyl  5 MG EC tablet Commonly known as: DULCOLAX Take 1 tablet (5 mg total) by mouth daily as needed for moderate constipation.   calcitRIOL  0.25 MCG capsule Commonly known as: ROCALTROL  Take 0.25 mcg by mouth 3 (three) times a week.   carvedilol  25 MG tablet Commonly known as: COREG  Take 25 mg by mouth daily at 12 noon.   Darbepoetin Alfa  100 MCG/0.5ML Sosy injection Commonly known as: ARANESP  Inject 0.5 mLs (100 mcg total) into the  skin every Saturday at 6 PM. Start taking on: May 13, 2024   FeroSul 325 (65 Fe) MG tablet Generic drug: ferrous sulfate  Take 325 mg by mouth daily.   ipratropium 0.02 % nebulizer solution Commonly known as: ATROVENT  Take 2.5 mLs (0.5 mg total) by nebulization every 6 (six) hours as needed for wheezing.   multivitamin with minerals Tabs tablet Take 1 tablet by mouth daily. Start taking on: May 12, 2024   oxyCODONE  5 MG immediate release tablet Commonly known as: Oxy IR/ROXICODONE  Take 1 tablet (5 mg total) by mouth every 6 (six) hours as needed for moderate pain (pain score 4-6).   senna-docusate 8.6-50 MG tablet Commonly known as: Senokot-S Take 1 tablet by mouth at bedtime as needed for mild constipation.   sertraline  25 MG tablet Commonly known as: ZOLOFT  Take 25 mg by mouth daily.   Vitamin D3 125 MCG (5000 UT) Caps Take 5,000 Units by mouth daily.               Discharge Care Instructions  (From admission, onward)           Start     Ordered   05/11/24 0000  Discharge wound care:        05/11/24 1044             Follow ups:    Follow-up Information  Koirala, Dibas, MD Follow up.   Specialty: Family Medicine Contact information: [address] [name] [address] [phone]                 Discharge Instructions:   Discharge Instructions     Call MD for:  difficulty breathing, headache or visual disturbances   Complete by: As directed    Call MD for:  extreme fatigue   Complete by: As directed    Call MD for:  hives   Complete by: As directed    Call MD for:  persistant dizziness or light-headedness   Complete by: As directed    Call MD for:  persistant nausea and vomiting   Complete by: As directed    Call MD for:  severe uncontrolled pain   Complete by: As directed    Call MD for:  temperature >100.4   Complete by: As directed    Diet renal with fluid restriction   Complete by: As  directed    Discharge instructions   Complete by: As directed    PDMP reviewed this encounter.   Opioid taper instructions: It is important to wean off of your opioid medication as soon as possible. If you do not need pain medication after your surgery it is ok to stop day one. Opioids include: Codeine, Hydrocodone (Norco, Vicodin), Oxycodone (Percocet, oxycontin ) and hydromorphone  amongst others.  Long term and even short term use of opiods can cause: Increased pain response Dependence Constipation Depression Respiratory depression And more.  Withdrawal symptoms can include Flu like symptoms Nausea, vomiting And more Techniques to manage these symptoms Hydrate well Eat regular healthy meals Stay active Use relaxation techniques(deep breathing, meditating, yoga) Do Not substitute Alcohol to help with tapering If you have been on opioids for less than two weeks and do not have pain than it is ok to stop all together.  Plan to wean off of opioids This plan should start within one week post op of your joint replacement. Maintain the same interval or time between taking each dose and first decrease the dose.  Cut the total daily intake of opioids by one tablet each day Next start to increase the time between doses. The last dose that should be eliminated is the evening dose.        General discharge instructions: Follow with Primary MD Regino, Dibas, MD in 7 days  Please request your PCP  to go over your hospital tests, procedures, radiology results at the follow up. Please get your medicines reviewed and adjusted.  Your PCP may decide to repeat certain labs or tests as needed. Do not drive, operate heavy machinery, perform activities at heights, swimming or participation in water  activities or provide baby sitting services if your were admitted for syncope or siezures until you have seen by Primary MD or a Neurologist and advised to do so again. [place]  Controlled  Substance Reporting System database was reviewed. Do not drive, operate heavy machinery, perform activities at heights, swim, participate in water  activities or provide baby-sitting services while on medications for pain, sleep and mood until your outpatient physician has reevaluated you and advised to do so again.  You are strongly recommended to comply with the dose, frequency and duration of prescribed medications. Activity: As tolerated with Full fall precautions use walker/cane & assistance as needed Avoid using any recreational substances like cigarette, tobacco, alcohol, or non-prescribed drug. If you experience worsening of your admission symptoms, develop shortness of breath, life threatening emergency,  suicidal or homicidal thoughts you must seek medical attention immediately by calling 911 or calling your MD immediately  if symptoms less severe. You must read complete instructions/literature along with all the possible adverse reactions/side effects for all the medicines you take and that have been prescribed to you. Take any new medicine only after you have completely understood and accepted all the possible adverse reactions/side effects.  Wear Seat belts while driving. You were cared for by a hospitalist during your hospital stay. If you have any questions about your discharge medications or the care you received while you were in the hospital after you are discharged, you can call the unit and ask to speak with the hospitalist or the covering physician. Once you are discharged, your primary care physician will handle any further medical issues. Please note that NO REFILLS for any discharge medications will be authorized once you are discharged, as it is imperative that you return to your primary care physician (or establish a relationship with a primary care physician if you do not have one).   Discharge wound care:   Complete by: As directed    Increase activity slowly   Complete by: As  directed        Discharge Exam:   Vitals:   05/10/24 2000 05/10/24 2015 05/11/24 0533 05/11/24 0734  BP: (!) (P) 112/52 (!) 112/52 (!) 128/42 (!) 122/47  Pulse:   64 62  Resp: (P) 18  16 18   Temp: (P) 98 F (36.7 C)  98 F (36.7 C) 98.2 F (36.8 C)  TempSrc: (P) Oral  Oral Oral  SpO2: (P) 99%  100% 98%  Weight:      Height:        Body mass index is 29.66 kg/m.   General exam: Pleasant, elderly African-American male.  Not in distress Skin: No rashes, lesions or ulcers. HEENT: Atraumatic, normocephalic, no obvious bleeding Lungs: Clear to auscultation bilaterally,  CVS: S1, S2, no murmur,   GI/Abd: Soft, nontender, nondistended, bowel sound present,   CNS: Alert, awake, oriented to place and person.  Slow to respond Psychiatry: Mood appropriate.  Cheerful. Extremities: No pedal edema, no calf tenderness,    The results of significant diagnostics from this hospitalization (including imaging, microbiology, ancillary and laboratory) are listed below for reference.    Procedures and Diagnostic Studies:   CT HEAD WO CONTRAST Result Date: 04/28/2024 EXAM: CT HEAD WITHOUT CONTRAST 04/28/2024 04:25:09 AM TECHNIQUE: CT of the head was performed without the administration of intravenous contrast. Automated exposure control, iterative reconstruction, and/or weight based adjustment of the mA/kV was utilized to reduce the radiation dose to as low as reasonably achievable. COMPARISON: None available. CLINICAL HISTORY: 78 year old male with mental status change of unknown cause. FINDINGS: BRAIN AND VENTRICLES: No acute hemorrhage. No evidence of acute infarct. No hydrocephalus. No extra-axial collection. No mass effect or midline shift. Mild for age periventricular and scattered cerebral white matter hypodensity. No cortical encephalomalacia identified and otherwise maintained gray white differentiation. No suspicious intracranial vascular hyperdensity. Severe calcified atherosclerosis at  the skull base. ORBITS: No acute abnormality. SINUSES: Paranasal sinuses, tympanic cavities and mastoids are clear. SOFT TISSUES AND SKULL: Calcified scalp vessel atherosclerosis, raising the possibility of end stage renal disease. No other acute soft tissue abnormality. No skull fracture. IMPRESSION: 1. No acute intracranial abnormality.  Mild for age white matter disease. 2. Advanced calcified atherosclerosis at the skull base and in the and scalp vessels, raising the possibility of end stage renal disease. Electronically  signed by: Helayne Hurst MD 04/28/2024 04:34 AM EST RP Workstation: [id number]   DG Chest Port 1 View Result Date: 04/28/2024 EXAM: 1 VIEW(S) XRAY OF THE CHEST 04/28/2024 04:10:17 AM COMPARISON: None available. CLINICAL HISTORY: altered LOC FINDINGS: LUNGS AND PLEURA: Low lung volumes. No focal pulmonary opacity. No pleural effusion. No pneumothorax. HEART AND MEDIASTINUM: Atherosclerotic plaque noted. No acute abnormality of the cardiac and mediastinal silhouettes. BONES AND SOFT TISSUES: No acute osseous abnormality. IMPRESSION: 1. No acute cardiopulmonary process identified. 2. Low lung volumes. Electronically signed by: Dorethia Molt MD 04/28/2024 04:13 AM EST RP Workstation: [id number]     Labs:   Basic Metabolic Panel: Recent Labs  Lab 05/04/24 1324 05/06/24 1137 05/08/24 0333 05/09/24 0840  NA 141 140 142 138  K 4.0 4.2 4.5 4.4  CL 105 104 105 102  CO2  --  29 30 29   GLUCOSE 90 86 72 142*  BUN 46* 31* 44* 58*  CREATININE 5.70* 4.59* 5.95* 6.58*  CALCIUM   --  8.6* 8.7* 8.7*  PHOS  --  3.2  --  3.8   GFR Estimated Creatinine Clearance: 11.6 mL/min (A) (by C-G formula based on SCr of 6.58 mg/dL (H)). Liver Function Tests: Recent Labs  Lab 05/06/24 1137 05/09/24 0840  ALBUMIN 2.9* 2.9*   No results for input(s): LIPASE, AMYLASE in the last 168 hours. No results for input(s): AMMONIA in the last 168 hours. Coagulation profile No results for input(s):  INR, PROTIME in the last 168 hours.  CBC: Recent Labs  Lab 05/04/24 1324 05/06/24 1137 05/08/24 0333 05/09/24 0840  WBC  --  3.0* 3.6* 4.0  NEUTROABS  --   --  1.6*  --   HGB 9.5* 7.8* 8.2* 8.1*  HCT 28.0* 24.7* 26.6* 25.9*  MCV  --  100.0 101.5* 102.4*  PLT  --  86* 95* 103*   Cardiac Enzymes: No results for input(s): CKTOTAL, CKMB, CKMBINDEX, TROPONINI in the last 168 hours. BNP: Invalid input(s): POCBNP CBG: Recent Labs  Lab 05/10/24 0900 05/10/24 1314 05/10/24 1626 05/10/24 2014 05/11/24 0800  GLUCAP 91 134* 119* 171* 160*   D-Dimer No results for input(s): DDIMER in the last 72 hours. Hgb A1c No results for input(s): HGBA1C in the last 72 hours. Lipid Profile No results for input(s): CHOL, HDL, LDLCALC, TRIG, CHOLHDL, LDLDIRECT in the last 72 hours. Thyroid function studies No results for input(s): TSH, T4TOTAL, T3FREE, THYROIDAB in the last 72 hours.  Invalid input(s): FREET3 Anemia work up No results for input(s): VITAMINB12, FOLATE, FERRITIN, TIBC, IRON, RETICCTPCT in the last 72 hours. Microbiology Recent Results (from the past 240 hours)  MRSA Next Gen by PCR, Nasal     Status: None   Collection Time: 05/01/24 11:57 AM   Specimen: Nasal Mucosa; Nasal Swab  Result Value Ref Range Status   MRSA by PCR Next Gen NOT DETECTED NOT DETECTED Final    Comment: (NOTE) The GeneXpert MRSA Assay (FDA approved for NASAL specimens only), is one component of a comprehensive MRSA colonization surveillance program. It is not intended to diagnose MRSA infection nor to guide or monitor treatment for MRSA infections. Test performance is not FDA approved in patients less than 102 years old. Performed at [facility], [address] [place], [name] [address]     Time coordinating discharge: 45 minutes  Signed: Ashlin Kreps  [facility] 05/11/2024, 10:44 AM

## 2024-05-12 ENCOUNTER — Inpatient Hospital Stay (HOSPITAL_COMMUNITY): Admission: RE | Admit: 2024-05-12 | Discharge: 2024-05-12 | Attending: Nephrology | Admitting: Nephrology

## 2024-05-12 VITALS — BP 125/53 | HR 73 | Temp 97.4°F | Resp 17

## 2024-05-12 DIAGNOSIS — D631 Anemia in chronic kidney disease: Secondary | ICD-10-CM | POA: Insufficient documentation

## 2024-05-12 DIAGNOSIS — N185 Chronic kidney disease, stage 5: Secondary | ICD-10-CM | POA: Diagnosis present

## 2024-05-12 LAB — RENAL FUNCTION PANEL
Albumin: 3.9 g/dL (ref 3.5–5.0)
Anion gap: 12 (ref 5–15)
BUN: 64 mg/dL — ABNORMAL HIGH (ref 8–23)
CO2: 26 mmol/L (ref 22–32)
Calcium: 10 mg/dL (ref 8.9–10.3)
Chloride: 100 mmol/L (ref 98–111)
Creatinine, Ser: 6.83 mg/dL — ABNORMAL HIGH (ref 0.61–1.24)
GFR, Estimated: 8 mL/min — ABNORMAL LOW
Glucose, Bld: 196 mg/dL — ABNORMAL HIGH (ref 70–99)
Phosphorus: 3.6 mg/dL (ref 2.5–4.6)
Potassium: 4.3 mmol/L (ref 3.5–5.1)
Sodium: 138 mmol/L (ref 135–145)

## 2024-05-12 LAB — IRON AND TIBC
Iron: 54 ug/dL (ref 45–182)
Saturation Ratios: 24 % (ref 17.9–39.5)
TIBC: 225 ug/dL — ABNORMAL LOW (ref 250–450)
UIBC: 171 ug/dL

## 2024-05-12 LAB — HEPATITIS B SURFACE ANTIGEN: Hepatitis B Surface Ag: NONREACTIVE

## 2024-05-12 LAB — POCT HEMOGLOBIN-HEMACUE: Hemoglobin: 9.5 g/dL — ABNORMAL LOW (ref 13.0–17.0)

## 2024-05-12 LAB — FERRITIN: Ferritin: 493 ng/mL — ABNORMAL HIGH (ref 24–336)

## 2024-05-12 MED ORDER — EPOETIN ALFA-EPBX 10000 UNIT/ML IJ SOLN
INTRAMUSCULAR | Status: AC
  Filled 2024-05-12: qty 1

## 2024-05-12 MED ORDER — EPOETIN ALFA-EPBX 10000 UNIT/ML IJ SOLN
10000.0000 [IU] | Freq: Once | INTRAMUSCULAR | Status: AC
  Administered 2024-05-12: 10000 [IU] via SUBCUTANEOUS

## 2024-05-12 NOTE — Discharge Planning (Signed)
 [facility]  Initial Hemodialysis Orders  Dialysis center: [facility]. Patient is discharged to a SNF  Patient's name: Kelly Dunn DOB: [birth year] AKI or ESRD: ESRD  Past Medical History: CKD stage V (progressed to ESRD), AVF in situ, diabetes mellitus, hypertension, neuropathy, and gout.  Discharge diagnosis: Uremic encephalopathy - HD initiated, see below. Gabapentin stopped and seen by psych. Mentation improving with HD. SIRS - likely 2nd hypothermia; blood cxs NGTD and empiric ABXs were stopped Hyperkalemia - resolved with HD HTN - on Amlodipine  and Coreg    Allergies: Allergies[1]  Date of First Dialysis: 04/29/24 Cause of renal disease: Presume HTN and T2DM  Dialysis Prescription: Dialysis Frequency: MWF for now. Noted he'ssupposed to change to TTS after the holidays Tx duration: 3.5hrs then 4hrs  BFR: 350 then 400  DFR: 500 then 600 EDW: 96kg  Dialyzer: 180NRe UF profile/Sodium modeling?: None Dialysis Bath: 2 K/2 Ca  Dialysis access: L AVF placed 07/15/20 by Dr. Harvey but noted its been failing to mature. On 12/8 access infiltrated, s/p F'gram with angioplasty on 12/8. Failed to cannulate on 12/9 and 12/11. S/p RIJ TDC placement 12/11 by [facility].  Will need to f/u with [facility] in outpatient on further plan for perm access. Use TDC for now. Per Dr. Dennise, rest access for now and attempt cannulation protocol in outpatient after infiltration resolves   In Center Medications: Heparin  Dose: 2500 units  Type: Bolus VDRA: Continue Calcitriol   25mcg with HD Venofer: Per protocol Aranesp  100mcg given on 12/13. Change to Mircera: 150mcg every 2 weeks. Next dose due: 05/13/24 No blood transfusions were noted during this hospitalization  Discharge labs: Hgb: 8.1  K+: 4.4  Ca: 8.7  Phos: 3.8  Alb: 2.9  Please draw routine labs. Additional labs needed: Routine labs per facility protocol  Charmaine Piety, NP      [1]  Allergies Allergen  Reactions   Nsaids Other (See Comments)    Contraindication due to CKD    Sertraline  Other (See Comments)    Hallucinations

## 2024-05-13 LAB — PTH, INTACT AND CALCIUM
Calcium, Total (PTH): 9.9 mg/dL (ref 8.6–10.2)
PTH: 203 pg/mL — ABNORMAL HIGH (ref 15–65)

## 2024-05-22 ENCOUNTER — Other Ambulatory Visit: Payer: Self-pay

## 2024-05-22 ENCOUNTER — Emergency Department (HOSPITAL_COMMUNITY)

## 2024-05-22 ENCOUNTER — Inpatient Hospital Stay (HOSPITAL_COMMUNITY)
Admission: EM | Admit: 2024-05-22 | Discharge: 2024-05-30 | DRG: 312 | Disposition: A | Discharge status: Skilled Nursing Facility | Attending: Internal Medicine | Admitting: Internal Medicine

## 2024-05-22 ENCOUNTER — Encounter (HOSPITAL_COMMUNITY): Payer: Self-pay

## 2024-05-22 DIAGNOSIS — Z888 Allergy status to other drugs, medicaments and biological substances status: Secondary | ICD-10-CM

## 2024-05-22 DIAGNOSIS — R0902 Hypoxemia: Secondary | ICD-10-CM | POA: Diagnosis present

## 2024-05-22 DIAGNOSIS — I951 Orthostatic hypotension: Principal | ICD-10-CM | POA: Diagnosis present

## 2024-05-22 DIAGNOSIS — R7989 Other specified abnormal findings of blood chemistry: Secondary | ICD-10-CM | POA: Diagnosis not present

## 2024-05-22 DIAGNOSIS — R569 Unspecified convulsions: Secondary | ICD-10-CM | POA: Diagnosis present

## 2024-05-22 DIAGNOSIS — E1122 Type 2 diabetes mellitus with diabetic chronic kidney disease: Secondary | ICD-10-CM | POA: Diagnosis present

## 2024-05-22 DIAGNOSIS — Z7982 Long term (current) use of aspirin: Secondary | ICD-10-CM

## 2024-05-22 DIAGNOSIS — N186 End stage renal disease: Secondary | ICD-10-CM | POA: Diagnosis present

## 2024-05-22 DIAGNOSIS — M1A9XX0 Chronic gout, unspecified, without tophus (tophi): Secondary | ICD-10-CM | POA: Diagnosis present

## 2024-05-22 DIAGNOSIS — I953 Hypotension of hemodialysis: Secondary | ICD-10-CM | POA: Diagnosis present

## 2024-05-22 DIAGNOSIS — I5A Non-ischemic myocardial injury (non-traumatic): Secondary | ICD-10-CM | POA: Diagnosis present

## 2024-05-22 DIAGNOSIS — I82409 Acute embolism and thrombosis of unspecified deep veins of unspecified lower extremity: Secondary | ICD-10-CM

## 2024-05-22 DIAGNOSIS — N2581 Secondary hyperparathyroidism of renal origin: Secondary | ICD-10-CM | POA: Diagnosis present

## 2024-05-22 DIAGNOSIS — I10 Essential (primary) hypertension: Secondary | ICD-10-CM | POA: Diagnosis present

## 2024-05-22 DIAGNOSIS — Z8249 Family history of ischemic heart disease and other diseases of the circulatory system: Secondary | ICD-10-CM

## 2024-05-22 DIAGNOSIS — Z8739 Personal history of other diseases of the musculoskeletal system and connective tissue: Secondary | ICD-10-CM

## 2024-05-22 DIAGNOSIS — E7800 Pure hypercholesterolemia, unspecified: Secondary | ICD-10-CM | POA: Diagnosis present

## 2024-05-22 DIAGNOSIS — E1151 Type 2 diabetes mellitus with diabetic peripheral angiopathy without gangrene: Secondary | ICD-10-CM | POA: Diagnosis present

## 2024-05-22 DIAGNOSIS — G9608 Other cranial cerebrospinal fluid leak: Secondary | ICD-10-CM | POA: Diagnosis present

## 2024-05-22 DIAGNOSIS — Z66 Do not resuscitate: Secondary | ICD-10-CM | POA: Diagnosis present

## 2024-05-22 DIAGNOSIS — R5383 Other fatigue: Secondary | ICD-10-CM | POA: Diagnosis present

## 2024-05-22 DIAGNOSIS — R413 Other amnesia: Secondary | ICD-10-CM | POA: Diagnosis present

## 2024-05-22 DIAGNOSIS — Z6829 Body mass index (BMI) 29.0-29.9, adult: Secondary | ICD-10-CM

## 2024-05-22 DIAGNOSIS — R42 Dizziness and giddiness: Secondary | ICD-10-CM | POA: Diagnosis not present

## 2024-05-22 DIAGNOSIS — E11649 Type 2 diabetes mellitus with hypoglycemia without coma: Secondary | ICD-10-CM | POA: Diagnosis present

## 2024-05-22 DIAGNOSIS — Z79899 Other long term (current) drug therapy: Secondary | ICD-10-CM

## 2024-05-22 DIAGNOSIS — N185 Chronic kidney disease, stage 5: Secondary | ICD-10-CM | POA: Diagnosis not present

## 2024-05-22 DIAGNOSIS — I6203 Nontraumatic chronic subdural hemorrhage: Secondary | ICD-10-CM | POA: Diagnosis present

## 2024-05-22 DIAGNOSIS — L8990 Pressure ulcer of unspecified site, unspecified stage: Secondary | ICD-10-CM | POA: Insufficient documentation

## 2024-05-22 DIAGNOSIS — R55 Syncope and collapse: Secondary | ICD-10-CM | POA: Diagnosis not present

## 2024-05-22 DIAGNOSIS — D539 Nutritional anemia, unspecified: Secondary | ICD-10-CM | POA: Diagnosis present

## 2024-05-22 DIAGNOSIS — E876 Hypokalemia: Secondary | ICD-10-CM | POA: Diagnosis present

## 2024-05-22 DIAGNOSIS — M109 Gout, unspecified: Secondary | ICD-10-CM | POA: Diagnosis present

## 2024-05-22 DIAGNOSIS — Z95820 Peripheral vascular angioplasty status with implants and grafts: Secondary | ICD-10-CM

## 2024-05-22 DIAGNOSIS — R079 Chest pain, unspecified: Secondary | ICD-10-CM

## 2024-05-22 DIAGNOSIS — E1140 Type 2 diabetes mellitus with diabetic neuropathy, unspecified: Secondary | ICD-10-CM | POA: Diagnosis present

## 2024-05-22 DIAGNOSIS — I82C11 Acute embolism and thrombosis of right internal jugular vein: Secondary | ICD-10-CM | POA: Diagnosis present

## 2024-05-22 DIAGNOSIS — Z87891 Personal history of nicotine dependence: Secondary | ICD-10-CM

## 2024-05-22 DIAGNOSIS — R4182 Altered mental status, unspecified: Secondary | ICD-10-CM | POA: Diagnosis not present

## 2024-05-22 DIAGNOSIS — E663 Overweight: Secondary | ICD-10-CM | POA: Diagnosis present

## 2024-05-22 DIAGNOSIS — E785 Hyperlipidemia, unspecified: Secondary | ICD-10-CM | POA: Insufficient documentation

## 2024-05-22 DIAGNOSIS — Z7409 Other reduced mobility: Secondary | ICD-10-CM | POA: Diagnosis present

## 2024-05-22 DIAGNOSIS — D631 Anemia in chronic kidney disease: Secondary | ICD-10-CM | POA: Diagnosis present

## 2024-05-22 DIAGNOSIS — Z833 Family history of diabetes mellitus: Secondary | ICD-10-CM

## 2024-05-22 DIAGNOSIS — I132 Hypertensive heart and chronic kidney disease with heart failure and with stage 5 chronic kidney disease, or end stage renal disease: Secondary | ICD-10-CM | POA: Diagnosis present

## 2024-05-22 DIAGNOSIS — Z992 Dependence on renal dialysis: Secondary | ICD-10-CM

## 2024-05-22 DIAGNOSIS — E119 Type 2 diabetes mellitus without complications: Secondary | ICD-10-CM

## 2024-05-22 DIAGNOSIS — I2489 Other forms of acute ischemic heart disease: Secondary | ICD-10-CM | POA: Diagnosis present

## 2024-05-22 DIAGNOSIS — I5020 Unspecified systolic (congestive) heart failure: Secondary | ICD-10-CM | POA: Diagnosis present

## 2024-05-22 LAB — CBC WITH DIFFERENTIAL/PLATELET
Abs Immature Granulocytes: 0.02 K/uL (ref 0.00–0.07)
Basophils Absolute: 0 K/uL (ref 0.0–0.1)
Basophils Relative: 0 %
Eosinophils Absolute: 0 K/uL (ref 0.0–0.5)
Eosinophils Relative: 0 %
HCT: 33.7 % — ABNORMAL LOW (ref 39.0–52.0)
Hemoglobin: 10.8 g/dL — ABNORMAL LOW (ref 13.0–17.0)
Immature Granulocytes: 0 %
Lymphocytes Relative: 10 %
Lymphs Abs: 0.5 K/uL — ABNORMAL LOW (ref 0.7–4.0)
MCH: 32.3 pg (ref 26.0–34.0)
MCHC: 32 g/dL (ref 30.0–36.0)
MCV: 100.9 fL — ABNORMAL HIGH (ref 80.0–100.0)
Monocytes Absolute: 0.2 K/uL (ref 0.1–1.0)
Monocytes Relative: 5 %
Neutro Abs: 3.9 K/uL (ref 1.7–7.7)
Neutrophils Relative %: 85 %
Platelets: 101 K/uL — ABNORMAL LOW (ref 150–400)
RBC: 3.34 MIL/uL — ABNORMAL LOW (ref 4.22–5.81)
RDW: 13.9 % (ref 11.5–15.5)
WBC: 4.6 K/uL (ref 4.0–10.5)
nRBC: 0 % (ref 0.0–0.2)

## 2024-05-22 LAB — COMPREHENSIVE METABOLIC PANEL WITH GFR
ALT: 18 U/L (ref 0–44)
AST: 21 U/L (ref 15–41)
Albumin: 3.9 g/dL (ref 3.5–5.0)
Alkaline Phosphatase: 100 U/L (ref 38–126)
Anion gap: 13 (ref 5–15)
BUN: 19 mg/dL (ref 8–23)
CO2: 30 mmol/L (ref 22–32)
Calcium: 8.6 mg/dL — ABNORMAL LOW (ref 8.9–10.3)
Chloride: 97 mmol/L — ABNORMAL LOW (ref 98–111)
Creatinine, Ser: 3.91 mg/dL — ABNORMAL HIGH (ref 0.61–1.24)
GFR, Estimated: 15 mL/min — ABNORMAL LOW
Glucose, Bld: 168 mg/dL — ABNORMAL HIGH (ref 70–99)
Potassium: 3.4 mmol/L — ABNORMAL LOW (ref 3.5–5.1)
Sodium: 139 mmol/L (ref 135–145)
Total Bilirubin: 0.6 mg/dL (ref 0.0–1.2)
Total Protein: 6.8 g/dL (ref 6.5–8.1)

## 2024-05-22 LAB — POCT I-STAT EG7
Acid-Base Excess: 5 mmol/L — ABNORMAL HIGH (ref 0.0–2.0)
Bicarbonate: 30.4 mmol/L — ABNORMAL HIGH (ref 20.0–28.0)
Calcium, Ion: 1.03 mmol/L — ABNORMAL LOW (ref 1.15–1.40)
HCT: 35 % — ABNORMAL LOW (ref 39.0–52.0)
Hemoglobin: 11.9 g/dL — ABNORMAL LOW (ref 13.0–17.0)
O2 Saturation: 43 %
Potassium: 3.2 mmol/L — ABNORMAL LOW (ref 3.5–5.1)
Sodium: 138 mmol/L (ref 135–145)
TCO2: 32 mmol/L (ref 22–32)
pCO2, Ven: 44.8 mmHg (ref 44–60)
pH, Ven: 7.439 — ABNORMAL HIGH (ref 7.25–7.43)
pO2, Ven: 23 mmHg — CL (ref 32–45)

## 2024-05-22 LAB — CBG MONITORING, ED
Glucose-Capillary: 136 mg/dL — ABNORMAL HIGH (ref 70–99)
Glucose-Capillary: 148 mg/dL — ABNORMAL HIGH (ref 70–99)
Glucose-Capillary: 168 mg/dL — ABNORMAL HIGH (ref 70–99)

## 2024-05-22 LAB — PHOSPHORUS: Phosphorus: 3.9 mg/dL (ref 2.5–4.6)

## 2024-05-22 LAB — TROPONIN T, HIGH SENSITIVITY
Troponin T High Sensitivity: 240 ng/L (ref 0–19)
Troponin T High Sensitivity: 334 ng/L (ref 0–19)
Troponin T High Sensitivity: 322 ng/L (ref 0–19)

## 2024-05-22 LAB — MAGNESIUM: Magnesium: 1.9 mg/dL (ref 1.7–2.4)

## 2024-05-22 MED ORDER — BISACODYL 5 MG PO TBEC: 5.0000 mg | DELAYED_RELEASE_TABLET | Freq: Every day | ORAL | Status: DC | PRN

## 2024-05-22 MED ORDER — ASPIRIN 81 MG PO TBEC
81.0000 mg | DELAYED_RELEASE_TABLET | Freq: Every day | ORAL | Status: DC
  Administered 2024-05-23 – 2024-05-28 (×5): 81 mg via ORAL
  Filled 2024-05-22 (×5): qty 1

## 2024-05-22 MED ORDER — VITAMIN D 25 MCG (1000 UNIT) PO TABS
5000.0000 [IU] | ORAL_TABLET | Freq: Every day | ORAL | Status: DC
  Administered 2024-05-23 – 2024-05-30 (×7): 5000 [IU] via ORAL
  Filled 2024-05-22 (×7): qty 5

## 2024-05-22 MED ORDER — FLEET ENEMA RE ENEM: 1.0000 | ENEMA | Freq: Once | RECTAL | Status: DC | PRN

## 2024-05-22 MED ORDER — SODIUM CHLORIDE 0.9% FLUSH
3.0000 mL | Freq: Two times a day (BID) | INTRAVENOUS | Status: AC
  Administered 2024-05-22 – 2024-05-30 (×13): 3 mL via INTRAVENOUS

## 2024-05-22 MED ORDER — ATORVASTATIN CALCIUM 40 MG PO TABS
40.0000 mg | ORAL_TABLET | Freq: Every day | ORAL | Status: AC
  Administered 2024-05-23 – 2024-05-30 (×7): 40 mg via ORAL
  Filled 2024-05-22 (×7): qty 1

## 2024-05-22 MED ORDER — POTASSIUM CHLORIDE CRYS ER 20 MEQ PO TBCR
20.0000 meq | EXTENDED_RELEASE_TABLET | Freq: Once | ORAL | Status: AC
  Administered 2024-05-22: 20 meq via ORAL
  Filled 2024-05-22: qty 1

## 2024-05-22 MED ORDER — ASPIRIN 81 MG PO CHEW
324.0000 mg | CHEWABLE_TABLET | Freq: Once | ORAL | Status: AC
  Administered 2024-05-22: 324 mg via ORAL
  Filled 2024-05-22: qty 4

## 2024-05-22 MED ORDER — OXYCODONE HCL 5 MG PO TABS: 5.0000 mg | ORAL_TABLET | ORAL | Status: DC | PRN

## 2024-05-22 MED ORDER — ACETAMINOPHEN 650 MG RE SUPP: 650.0000 mg | Freq: Four times a day (QID) | RECTAL | Status: DC | PRN

## 2024-05-22 MED ORDER — ACETAMINOPHEN 325 MG PO TABS: 650.0000 mg | ORAL_TABLET | Freq: Four times a day (QID) | ORAL | Status: DC | PRN

## 2024-05-22 MED ORDER — TRAZODONE HCL 50 MG PO TABS
25.0000 mg | ORAL_TABLET | Freq: Every evening | ORAL | Status: DC | PRN
  Administered 2024-05-23: 25 mg via ORAL
  Filled 2024-05-22: qty 1

## 2024-05-22 MED ORDER — AMLODIPINE BESYLATE 5 MG PO TABS: 10.0000 mg | ORAL_TABLET | Freq: Every day | ORAL | Status: DC

## 2024-05-22 MED ORDER — ONDANSETRON HCL 4 MG/2ML IJ SOLN: 4.0000 mg | Freq: Four times a day (QID) | INTRAMUSCULAR | Status: AC | PRN

## 2024-05-22 MED ORDER — CARVEDILOL 12.5 MG PO TABS: 25.0000 mg | ORAL_TABLET | Freq: Every day | ORAL | Status: DC

## 2024-05-22 MED ORDER — SODIUM CHLORIDE 0.9% FLUSH: 3.0000 mL | Freq: Two times a day (BID) | INTRAVENOUS | Status: DC

## 2024-05-22 MED ORDER — INSULIN ASPART 100 UNIT/ML IJ SOLN
0.0000 [IU] | Freq: Three times a day (TID) | INTRAMUSCULAR | Status: DC
  Administered 2024-05-23 – 2024-05-30 (×5): 1 [IU] via SUBCUTANEOUS
  Filled 2024-05-22 (×5): qty 1

## 2024-05-22 MED ORDER — SENNOSIDES-DOCUSATE SODIUM 8.6-50 MG PO TABS: 1.0000 | ORAL_TABLET | Freq: Every evening | ORAL | Status: DC | PRN

## 2024-05-22 MED ORDER — ONDANSETRON HCL 4 MG PO TABS: 4.0000 mg | ORAL_TABLET | Freq: Four times a day (QID) | ORAL | Status: AC | PRN

## 2024-05-22 MED ORDER — CALCIUM GLUCONATE-NACL 2-0.675 GM/100ML-% IV SOLN
2.0000 g | Freq: Once | INTRAVENOUS | Status: AC
  Administered 2024-05-22: 2000 mg via INTRAVENOUS
  Filled 2024-05-22 (×2): qty 100

## 2024-05-22 MED ORDER — CALCITRIOL 0.25 MCG PO CAPS
0.2500 ug | ORAL_CAPSULE | ORAL | Status: DC
  Administered 2024-05-24 – 2024-05-29 (×3): 0.25 ug via ORAL
  Filled 2024-05-22 (×4): qty 1

## 2024-05-22 MED ORDER — HYDROMORPHONE HCL 1 MG/ML IJ SOLN: 0.5000 mg | INTRAMUSCULAR | Status: DC | PRN

## 2024-05-22 MED ORDER — ASPIRIN 81 MG PO CHEW
81.0000 mg | CHEWABLE_TABLET | Freq: Once | ORAL | Status: AC
  Administered 2024-05-22: 81 mg via ORAL
  Filled 2024-05-22: qty 1

## 2024-05-22 MED ORDER — SENNOSIDES-DOCUSATE SODIUM 8.6-50 MG PO TABS
1.0000 | ORAL_TABLET | Freq: Every day | ORAL | Status: DC
  Administered 2024-05-22 – 2024-05-29 (×7): 1 via ORAL
  Filled 2024-05-22 (×6): qty 1

## 2024-05-22 MED ORDER — HYDRALAZINE HCL 20 MG/ML IJ SOLN: 10.0000 mg | INTRAMUSCULAR | Status: DC | PRN

## 2024-05-22 MED ORDER — ALLOPURINOL 100 MG PO TABS
100.0000 mg | ORAL_TABLET | Freq: Every day | ORAL | Status: AC
  Administered 2024-05-23 – 2024-05-30 (×7): 100 mg via ORAL
  Filled 2024-05-22 (×7): qty 1

## 2024-05-22 MED ORDER — MELATONIN 5 MG PO TABS
5.0000 mg | ORAL_TABLET | Freq: Every day | ORAL | Status: AC
  Administered 2024-05-22 – 2024-05-29 (×8): 5 mg via ORAL
  Filled 2024-05-22 (×8): qty 1

## 2024-05-22 MED ORDER — FERROUS SULFATE 325 (65 FE) MG PO TABS
325.0000 mg | ORAL_TABLET | Freq: Every day | ORAL | Status: DC
  Administered 2024-05-23 – 2024-05-30 (×7): 325 mg via ORAL
  Filled 2024-05-22 (×7): qty 1

## 2024-05-22 MED ORDER — IPRATROPIUM BROMIDE 0.02 % IN SOLN: 0.5000 mg | Freq: Four times a day (QID) | RESPIRATORY_TRACT | Status: DC | PRN

## 2024-05-22 MED ORDER — HEPARIN SODIUM (PORCINE) 5000 UNIT/ML IJ SOLN
5000.0000 [IU] | Freq: Three times a day (TID) | INTRAMUSCULAR | Status: DC
  Administered 2024-05-22 – 2024-05-24 (×5): 5000 [IU] via SUBCUTANEOUS
  Filled 2024-05-22 (×5): qty 1

## 2024-05-22 MED ORDER — ADULT MULTIVITAMIN W/MINERALS CH
1.0000 | ORAL_TABLET | Freq: Every day | ORAL | Status: DC
  Administered 2024-05-23 – 2024-05-30 (×7): 1 via ORAL
  Filled 2024-05-22 (×7): qty 1

## 2024-05-22 NOTE — Consult Note (Signed)
 "  Cardiology Consultation   Patient ID: Kelly Dunn MRN: [id number]; DOB: February 06, 1946  Admit date: 05/22/2024 Date of Consult: 05/22/2024  PCP:  Regino Slater, MD   [facility] Providers Cardiologist:  None        Patient Profile: Kelly Dunn is a 78 y.o. male with a hx of end-stage renal disease diabetes hypertension hyperlipidemia who is being seen 05/22/2024 for the evaluation of chest pain and elevated troponin at the request of Dr. Willette.  History of Present Illness: Kelly Dunn 78 year old male who initiated hemodialysis recently on 04/29/2024 with end-stage renal disease type 1 diabetes hypertension hyperlipidemia here after he experienced a transient change in his mental status via dialysis center documentation briefly hypoxic requiring oxygen.  1 nurse at the dialysis center was about to start CPR when another nurse sternal rubbed them and he eventually woke up.  He received a 500 cc bolus which then resulted in baseline consciousness.  Has no recollection of the event.  During his recent hospitalization he was confused agitated encephalopathic uremic.  Head CT here shows no acute changes.  Chest x-ray unremarkable.  Troponin elevation was noted, 240, mildly elevated.  There was no recollection of chest pain currently.  EKG shows sinus rhythm prolonged PR interval at 227 ms, Q waves inferiorly and poor R wave progression.   Past Medical History:  Diagnosis Date   Chronic kidney disease (CKD), stage III (moderate) (HCC)    Diabetes mellitus    type II   Dyspnea    with activity   Gout    Hypercholesteremia    Hypertension    Neuropathy associated with endocrine disorder     Past Surgical History:  Procedure Laterality Date   A/V FISTULAGRAM Left 05/01/2024   Procedure: A/V Fistulagram;  Surgeon: Sheree Penne Bruckner, MD;  Location: [facility] INVASIVE CV LAB;  Service: Cardiovascular;  Laterality: Left;   A/V SHUNT INTERVENTION Left  05/01/2024   Procedure: A/V SHUNT INTERVENTION;  Surgeon: Sheree Penne Bruckner, MD;  Location: [facility] INVASIVE CV LAB;  Service: Cardiovascular;  Laterality: Left;   AV FISTULA PLACEMENT Left 07/15/2020   Procedure: Brachiocephalic Arteriovenous Fistula;  Surgeon: Harvey Carlin BRAVO, MD;  Location: [facility] OR;  Service: Vascular;  Laterality: Left;  PERIPHERAL NERVE BLOCK   FISTULA SUPERFICIALIZATION Left 08/27/2020   Procedure: LEFT ARM ARTERIOVENOUS FISTULA SUPERFICIALIZATION;  Surgeon: Harvey Carlin BRAVO, MD;  Location: [facility] OR;  Service: Vascular;  Laterality: Left;   INSERTION OF DIALYSIS CATHETER Right 05/04/2024   Procedure: RIGHT INSERTION OF TUNNELED DIALYSIS CATHETER;  Surgeon: Pearline Norman RAMAN, MD;  Location: [facility] OR;  Service: Vascular;  Laterality: Right;     Home Medications:  Prior to Admission medications  Medication Sig Start Date End Date Taking? Authorizing Provider  acetaminophen  (TYLENOL ) 325 MG tablet Take 2 tablets (650 mg total) by mouth every 6 (six) hours as needed for mild pain (pain score 1-3) (or Fever >/= 101). 05/11/24   Arlice Reichert, MD  allopurinol  (ZYLOPRIM ) 100 MG tablet Take 1 tablet (100 mg total) by mouth daily. 05/12/24   Arlice Reichert, MD  amLODipine  (NORVASC ) 10 MG tablet Take 10 mg by mouth daily.    [provider]  aspirin  81 MG tablet Take 81 mg by mouth daily.    [provider]  atorvastatin  (LIPITOR) 40 MG tablet Take 40 mg by mouth daily.    [provider]  bisacodyl  (DULCOLAX) 5 MG EC tablet Take 1 tablet (5 mg total) by mouth daily as needed  for moderate constipation. 05/11/24   Arlice Reichert, MD  calcitRIOL  (ROCALTROL ) 0.25 MCG capsule Take 0.25 mcg by mouth 3 (three) times a week. 03/29/24   [provider]  carvedilol  (COREG ) 25 MG tablet Take 25 mg by mouth daily at 12 noon.    [provider]  Cholecalciferol  (VITAMIN D3) 5000 units CAPS Take 5,000 Units by mouth daily.    [provider]   Darbepoetin Alfa  (ARANESP ) 100 MCG/0.5ML SOSY injection Inject 0.5 mLs (100 mcg total) into the skin every Saturday at 6 PM. 05/13/24   Dahal, Reichert, MD  FEROSUL 325 (65 Fe) MG tablet Take 325 mg by mouth daily. 04/10/24   [provider]  ipratropium (ATROVENT ) 0.02 % nebulizer solution Take 2.5 mLs (0.5 mg total) by nebulization every 6 (six) hours as needed for wheezing. 05/11/24   Arlice Reichert, MD  Multiple Vitamin (MULTIVITAMIN WITH MINERALS) TABS tablet Take 1 tablet by mouth daily. 05/12/24   Arlice Reichert, MD  oxyCODONE  (OXY IR/ROXICODONE ) 5 MG immediate release tablet Take 1 tablet (5 mg total) by mouth every 6 (six) hours as needed for moderate pain (pain score 4-6). 05/11/24   Dahal, Reichert, MD  senna-docusate (SENOKOT-S) 8.6-50 MG tablet Take 1 tablet by mouth at bedtime as needed for mild constipation. 05/11/24   Arlice Reichert, MD  sertraline  (ZOLOFT ) 25 MG tablet Take 25 mg by mouth daily. Patient not taking: Reported on 04/28/2024    [provider]    Scheduled Meds:  aspirin   81 mg Oral Once   heparin   5,000 Units Subcutaneous Q8H   insulin  aspart  0-6 Units Subcutaneous TID WC   potassium chloride   20 mEq Oral Once   senna-docusate  1 tablet Oral QHS   sodium chloride  flush  3 mL Intravenous Q12H   Continuous Infusions:  calcium  gluconate     PRN Meds: acetaminophen  **OR** acetaminophen , hydrALAZINE , HYDROmorphone  (DILAUDID ) injection, ipratropium, ondansetron  **OR** ondansetron  (ZOFRAN ) IV, oxyCODONE , traZODone   Allergies:   Allergies[1]  Social History:   Social History   Socioeconomic History   Marital status: Married    Spouse name: Not on file   Number of children: Not on file   Years of education: Not on file   Highest education level: Not on file  Occupational History   Not on file  Tobacco Use   Smoking status: Former   Smokeless tobacco: Former    Quit date: 07/16/1972   Tobacco comments:    stopped age 15  Vaping Use   Vaping  status: Never Used  Substance and Sexual Activity   Alcohol use: No   Drug use: No   Sexual activity: Not on file  Other Topics Concern   Not on file  Social History Narrative   Not on file   Social Drivers of Health   Tobacco Use: Medium Risk (05/22/2024)   Patient History    Smoking Tobacco Use: Former    Smokeless Tobacco Use: Former    Passive Exposure: Not on Stage Manager: Not on [employer] Insecurity: Not on file  Transportation Needs: Not on file  Physical Activity: Not on file  Stress: Not on file  Social Connections: Not on file  Intimate Partner Violence: Not on file  Depression (PHQ2-9): Not on file  Alcohol Screen: Not on file  Housing: Not on file  Utilities: Not on file  Health Literacy: Not on file    Family History:    Family History  Problem Relation Age  of Onset   Diabetes Other    Hypertension Other      ROS:  Please see the history of present illness.  All other ROS reviewed and negative.     Physical Exam/Data: Vitals:   05/22/24 1320 05/22/24 1400 05/22/24 1415 05/22/24 1530  BP: (!) 140/50 (!) 158/67 (!) 149/62 133/64  Pulse: 84 77 78 79  Resp: 15 17 10 18   Temp: (!) 97.5 F (36.4 C)     TempSrc: Oral     SpO2: 100% 99% 98% 98%   No intake or output data in the 24 hours ending 05/22/24 1630    05/09/2024    7:51 AM 05/09/2024    4:47 AM 05/08/2024    5:00 AM  Last 3 Weights  Weight (lbs) 218 lb 11.1 oz 209 lb 7 oz 207 lb 7.3 oz  Weight (kg) 99.2 kg 95 kg 94.1 kg     There is no height or weight on file to calculate BMI.  General:  Well nourished, well developed, in no acute distress HEENT: normal Neck: no JVD Vascular: No carotid bruits; Distal pulses 2+ bilaterally Cardiac:  normal S1, S2; RRR; no murmur  Lungs:  clear to auscultation bilaterally, no wheezing, rhonchi or rales  Abd: soft, nontender, no hepatomegaly  Ext: no edema Musculoskeletal:  No deformities, BUE and BLE strength normal and  equal Skin: warm and dry  Neuro:  CNs 2-12 intact, no focal abnormalities noted Psych:  Normal affect   EKG:  The EKG was personally reviewed and demonstrates: As above Telemetry:  Telemetry was personally reviewed and demonstrates: No adverse arrhythmias  Relevant CV Studies: None  Laboratory Data: High Sensitivity Troponin:  No results for input(s): TROPONINIHS in the last 720 hours.  Recent Labs  Lab 05/22/24 1347  TRNPT 240*      Chemistry Recent Labs  Lab 05/22/24 1347 05/22/24 1353  NA 139 138  K 3.4* 3.2*  CL 97*  --   CO2 30  --   GLUCOSE 168*  --   BUN 19  --   CREATININE 3.91*  --   CALCIUM  8.6*  --   GFRNONAA 15*  --   ANIONGAP 13  --     Recent Labs  Lab 05/22/24 1347  PROT 6.8  ALBUMIN 3.9  AST 21  ALT 18  ALKPHOS 100  BILITOT 0.6   Lipids No results for input(s): CHOL, TRIG, HDL, LABVLDL, LDLCALC, CHOLHDL in the last 168 hours.  Hematology Recent Labs  Lab 05/22/24 1347 05/22/24 1353  WBC 4.6  --   RBC 3.34*  --   HGB 10.8* 11.9*  HCT 33.7* 35.0*  MCV 100.9*  --   MCH 32.3  --   MCHC 32.0  --   RDW 13.9  --   PLT 101*  --    Thyroid No results for input(s): TSH, FREET4 in the last 168 hours.  BNPNo results for input(s): BNP, PROBNP in the last 168 hours.  DDimer No results for input(s): DDIMER in the last 168 hours.  Radiology/Studies:  DG Chest Portable 1 View Result Date: 05/22/2024 EXAM: 1 VIEW(S) XRAY OF THE CHEST 05/22/2024 01:50:00 PM COMPARISON: 05/04/2024 CLINICAL HISTORY: chest pain FINDINGS: LINES, TUBES AND DEVICES: Right chest dialysis catheter in place. Left subclavian stent noted. LUNGS AND PLEURA: Diffusely increased interstitial prominence. No focal pulmonary airspace opacity. No pleural effusion. No pneumothorax. HEART AND MEDIASTINUM: Calcified aorta. BONES AND SOFT TISSUES: No acute osseous abnormality. IMPRESSION: 1. Increased interstitial prominence  concerning interstitial edema. No  significant pleural effusion or consolidative change. Electronically signed by: Waddell Calk MD 05/22/2024 02:50 PM EST RP Workstation: [id number]   CT Head Wo Contrast Result Date: 05/22/2024 EXAM: CT HEAD WITHOUT CONTRAST 05/22/2024 02:01:07 PM TECHNIQUE: CT of the head was performed without the administration of intravenous contrast. Automated exposure control, iterative reconstruction, and/or weight based adjustment of the mA/kV was utilized to reduce the radiation dose to as low as reasonably achievable. COMPARISON: head CT 04/28/2024 CLINICAL HISTORY: Mental status change, unknown cause FINDINGS: BRAIN AND VENTRICLES: Asymmetric low density extra-axial fluid over the right frontal convexity is unchanged, measuring up to 8 mm in thickness with associated slight mass effect on the frontal lobe and compatible with a subdural hygroma or chronic hematoma. No significant midline shift, acute intracranial hemorrhage, acute infarct, hydrocephalus, or mass is identified. There is mild cerebral atrophy. Cerebral white matter hypodensities are unchanged and nonspecific but compatible with mild chronic small vessel ischemic disease. Calcified atherosclerosis at the skull base. ORBITS: No acute abnormality. SINUSES: Minimal mucosal thickening in the paranasal sinuses. Clear mastoid air cells. SOFT TISSUES AND SKULL: No acute soft tissue abnormality. No skull fracture. IMPRESSION: 1. No acute intracranial abnormality. 2. Unchanged small right frontal subdural hygroma or chronic subdural hematoma. 3. Mild chronic small vessel ischemic disease. Electronically signed by: Dasie Hamburg MD 05/22/2024 02:23 PM EST RP Workstation: [id number]     Assessment and Plan:  78 year old with recent new start dialysis end-stage renal disease recent encephalopathic admission with transient loss of consciousness provided by sternal rub and 500 cc bolus.  Troponin mildly elevated at 240 range.  Elevated troponin - Likely  representative of acute myocardial injury in the setting of both end-stage renal disease as well as transient hypotension/loss of consciousness.  Not likely ACS.  No need for heparinization.  Also unclear of chest pain history.  Could be some discomfort after sternal rub. -Continue with atorvastatin  40 mg. -Although elevated troponin is not representative of acute coronary syndrome, this does worsen his overall prognosis. -If anginal symptoms develop especially with exertion, further testing may be warranted.  For now continue with aggressive medical management. -Would be reasonable to check an echocardiogram to ensure proper structure and function of his heart.  Altered mental state - Unclear etiology, possibly hypotension transiently.  Seem to respond to fluid bolus.  No indication at this point of adverse arrhythmia.  He is monitored closely at hemodialysis sessions.  Peripheral vascular disease - Left subclavian stent noted  End-stage renal disease on hemodialysis - Per nephrology.    Risk Assessment/Risk Scores:          For questions or updates, please contact [facility] Please consult [email or web address] for contact info under      Signed, Oneil Parchment, MD  05/22/2024 4:30 PM     [1]  Allergies Allergen Reactions   Nsaids Other (See Comments)    Contraindication due to CKD    Sertraline  Other (See Comments)    Hallucinations    "

## 2024-05-22 NOTE — ED Triage Notes (Signed)
 Pt bib [facility] coming from dialysis after EMS called out for CPR. EMS reports nurse at dialysis did not initiate CPR but patient was very lethargic, altered and went unresponsive. Per EMS, dialysis nurse rapid flushed dialysis catheter and the monitor went crazy. Pt 90% RA on EMS arrival and put on 4L [place].   EMS VS: 10/70 90%  80 HR 221 cbg

## 2024-05-22 NOTE — ED Notes (Signed)
 Patient transported to CT

## 2024-05-22 NOTE — Assessment & Plan Note (Addendum)
-   Nephrology following - Dialysis per nephrology -BP medications have been all held at this time - tolerated HD on 1/3 with no hypoTN and no midodrine  needed - also tolerated via LUE fistula - eventual outpt removal of permcath; defer timing to nephrology

## 2024-05-22 NOTE — Assessment & Plan Note (Signed)
 Likely ischemic demand; combo of hypotension and HD - Appreciate cardiology evaluation as well -Echo obtained, EF 45 to 50%, global hypokinesis, grade 1 DD

## 2024-05-22 NOTE — ED Notes (Signed)
 CCMD contacted to put patient on cardiac monitoring.

## 2024-05-22 NOTE — ED Notes (Signed)
 Pt provided urinal and asked to provide sample when able to. Pt states he does not make much urine normally.

## 2024-05-22 NOTE — ED Notes (Signed)
 Pt transferred to hospital bed. Remains on continuous cardiac monitoring, safety maintained, call bell in reach.

## 2024-05-22 NOTE — TOC CM/SW Note (Signed)
 TOC consult received for d/c planning needs. As patient has outpt HD chair, Renal CM notified via Epic chat. Follow-up to be completed with patient as appropriate.   Merilee Batty, MSN, RN Case Management [phone]

## 2024-05-22 NOTE — H&P (Signed)
 " History and Physical   Patient: Ava Hila                            PCP: Regino Slater, MD                    DOB: [birth year]            DOA: 05/22/2024 FMW:[id number]             DOS: 05/22/2024, 3:58 PM  Koirala, Dibas, MD  Patient coming from:   HOME  I have personally reviewed patient's medical records, in electronic medical records, including:  [facility], and care everywhere.    Chief Complaint:   Chief Complaint  Patient presents with   Altered Mental Status    History of present illness:    Ashtin Rosner is a 78 year old male with a significant history of ESRD (initiated HD 04/29/2024), Type 1 Diabetes, HTN, HLD, gout, and neuropathy. He was recently hospitalized (04/28/24) for uremic encephalopathy, which prompted the start of chronic hemodialysis.  During today's dialysis session, the patient experienced an acute, transient change in mental status. Documentation from the dialysis center and EMS indicates the patient was found altered and briefly hypoxic (requiring supplemental oxygen). Dialysis was immediately suspended, and the patient received a 500 cc fluid bolus, resulting in a rapid return to baseline consciousness.  The patient is a limited historian and has total amnesia regarding the event. He currently denies any headache, shortness of breath, or focal neurological deficits (no weakness or sensory changes).   Recent hospitalization 12/25 for progressive agitation confusion.  Was hospitalized, was found encephalopathic-uremic encephalopathy -was started on dialysis 04/29/2024.    ED Evaluation:  Blood pressure 133/64, pulse 79, temperature (!) 97.5 F (36.4 C), temperature source Oral, resp. rate 18, SpO2 98%.  LABs: VBG: 7.4/44.8/23/bicarb 30.4, potassium 3.4, 3.2, glucose 168, creatinine 3.91, calcium  8.6, ionized calcium  1.03, GFR 15, troponin 240, TIBC 225, hemoglobin 11.9  EKG: No acute abnormalities, Head CT -no acute intracranial  changes. Unchanged small right frontal subdural hygroma or chronic subdural hematoma  CXR -increased interstitial prominence, edema, otherwise within normal    Patient Denies having: Fever, Chills, Cough, SOB, Chest Pain, Abd pain, N/V/D, headache, dizziness, lightheadedness,  Dysuria, Joint pain, rash, open wounds    Review of Systems: As per HPI, otherwise 10 point review of systems were negative.   ----------------------------------------------------------------------------------------------------------------------  Allergies[1]  Home MEDs:  Prior to Admission medications  Medication Sig Start Date End Date Taking? Authorizing Provider  acetaminophen  (TYLENOL ) 325 MG tablet Take 2 tablets (650 mg total) by mouth every 6 (six) hours as needed for mild pain (pain score 1-3) (or Fever >/= 101). 05/11/24   Arlice Reichert, MD  allopurinol  (ZYLOPRIM ) 100 MG tablet Take 1 tablet (100 mg total) by mouth daily. 05/12/24   Arlice Reichert, MD  amLODipine  (NORVASC ) 10 MG tablet Take 10 mg by mouth daily.    [provider]  aspirin  81 MG tablet Take 81 mg by mouth daily.    [provider]  atorvastatin  (LIPITOR) 40 MG tablet Take 40 mg by mouth daily.    [provider]  bisacodyl  (DULCOLAX) 5 MG EC tablet Take 1 tablet (5 mg total) by mouth daily as needed for moderate constipation. 05/11/24   Arlice Reichert, MD  calcitRIOL  (ROCALTROL ) 0.25 MCG capsule Take 0.25 mcg by mouth 3 (three) times a week. 03/29/24   [provider]  carvedilol  (COREG ) 25 MG tablet Take 25 mg by mouth daily at 12 noon.    [provider]  Cholecalciferol  (VITAMIN D3) 5000 units CAPS Take 5,000 Units by mouth daily.    [provider]  Darbepoetin Alfa  (ARANESP ) 100 MCG/0.5ML SOSY injection Inject 0.5 mLs (100 mcg total) into the skin every Saturday at 6 PM. 05/13/24   Dahal, Chapman, MD  FEROSUL 325 (65 Fe) MG tablet Take 325 mg by mouth daily. 04/10/24   [provider]  ipratropium (ATROVENT ) 0.02 % nebulizer solution Take 2.5 mLs (0.5 mg total) by nebulization every 6 (six) hours as needed for wheezing. 05/11/24   Arlice Chapman, MD  Multiple Vitamin (MULTIVITAMIN WITH MINERALS) TABS tablet Take 1 tablet by mouth daily. 05/12/24   Arlice Chapman, MD  oxyCODONE  (OXY IR/ROXICODONE ) 5 MG immediate release tablet Take 1 tablet (5 mg total) by mouth every 6 (six) hours as needed for moderate pain (pain score 4-6). 05/11/24   Dahal, Chapman, MD  senna-docusate (SENOKOT-S) 8.6-50 MG tablet Take 1 tablet by mouth at bedtime as needed for mild constipation. 05/11/24   Arlice Chapman, MD  sertraline  (ZOLOFT ) 25 MG tablet Take 25 mg by mouth daily. Patient not taking: Reported on 04/28/2024    [provider]    PRN MEDs: acetaminophen  **OR** acetaminophen , hydrALAZINE , HYDROmorphone  (DILAUDID ) injection, ipratropium, ondansetron  **OR** ondansetron  (ZOFRAN ) IV, oxyCODONE , traZODone   Past Medical History:  Diagnosis Date   Chronic kidney disease (CKD), stage III (moderate) (HCC)    Diabetes mellitus    type II   Dyspnea    with activity   Gout    Hypercholesteremia    Hypertension    Neuropathy associated with endocrine disorder     Past Surgical History:  Procedure Laterality Date   A/V FISTULAGRAM Left 05/01/2024   Procedure: A/V Fistulagram;  Surgeon: Sheree Penne Bruckner, MD;  Location: [facility] INVASIVE CV LAB;  Service: Cardiovascular;  Laterality: Left;   A/V SHUNT INTERVENTION Left 05/01/2024   Procedure: A/V SHUNT INTERVENTION;  Surgeon: Sheree Penne Bruckner, MD;  Location: [facility] INVASIVE CV LAB;  Service: Cardiovascular;  Laterality: Left;   AV FISTULA PLACEMENT Left 07/15/2020   Procedure: Brachiocephalic Arteriovenous Fistula;  Surgeon: Harvey Carlin BRAVO, MD;  Location: [facility] OR;  Service: Vascular;  Laterality: Left;  PERIPHERAL NERVE BLOCK   FISTULA SUPERFICIALIZATION Left 08/27/2020   Procedure: LEFT ARM ARTERIOVENOUS FISTULA  SUPERFICIALIZATION;  Surgeon: Harvey Carlin BRAVO, MD;  Location: [facility] OR;  Service: Vascular;  Laterality: Left;   INSERTION OF DIALYSIS CATHETER Right 05/04/2024   Procedure: RIGHT INSERTION OF TUNNELED DIALYSIS CATHETER;  Surgeon: Pearline Norman RAMAN, MD;  Location: [facility] OR;  Service: Vascular;  Laterality: Right;     reports that he has quit smoking. He quit smokeless tobacco use about 51 years ago. He reports that he does not drink alcohol and does not use drugs.   Family History  Problem Relation Age of Onset   Diabetes Other    Hypertension Other     Physical Exam:   Vitals:   05/22/24 1320 05/22/24 1400 05/22/24 1415 05/22/24 1530  BP: (!) 140/50 (!) 158/67 (!) 149/62 133/64  Pulse: 84 77 78 79  Resp: 15 17 10 18   Temp: (!) 97.5 F (36.4 C)     TempSrc: Oral     SpO2: 100% 99% 98% 98%   Constitutional: NAD, calm, comfortable Eyes: PERRL, lids and conjunctivae normal ENMT: Mucous membranes are moist. Posterior pharynx clear of any exudate or lesions.Normal dentition.  Neck: normal, supple, no masses, no thyromegaly Respiratory: clear to auscultation bilaterally, no wheezing, no crackles. Normal respiratory effort. No accessory muscle use.  Cardiovascular: Regular rate and rhythm, no murmurs / rubs / gallops. No extremity edema. 2+ pedal pulses. No carotid bruits.  Abdomen: no tenderness, no masses palpated. No hepatosplenomegaly. Bowel sounds positive.  Musculoskeletal: no clubbing / cyanosis. No joint deformity upper and lower extremities. Good ROM, no contractures. Normal muscle tone.  Neurologic: CN II-XII grossly intact. Sensation intact, DTR normal. Strength 5/5 in all 4.  Psychiatric: Normal judgment and insight. Alert and oriented x 3. Normal mood.  Skin: no rashes, lesions, ulcers. No induration Decubitus/ulcers:  Wounds: per nursing documentation         Labs on admission:    I have personally reviewed following labs and imaging studies  CBC: Recent Labs   Lab 05/22/24 1347 05/22/24 1353  WBC 4.6  --   NEUTROABS 3.9  --   HGB 10.8* 11.9*  HCT 33.7* 35.0*  MCV 100.9*  --   PLT 101*  --    Basic Metabolic Panel: Recent Labs  Lab 05/22/24 1347 05/22/24 1353  NA 139 138  K 3.4* 3.2*  CL 97*  --   CO2 30  --   GLUCOSE 168*  --   BUN 19  --   CREATININE 3.91*  --   CALCIUM  8.6*  --    GFR: Estimated Creatinine Clearance: 19.6 mL/min (A) (by C-G formula based on SCr of 3.91 mg/dL (H)). Liver Function Tests: Recent Labs  Lab 05/22/24 1347  AST 21  ALT 18  ALKPHOS 100  BILITOT 0.6  PROT 6.8  ALBUMIN 3.9    Urine analysis:    Component Value Date/Time   COLORURINE YELLOW 04/28/2024 1348   APPEARANCEUR CLEAR 04/28/2024 1348   LABSPEC 1.020 04/28/2024 1348   PHURINE 6.0 04/28/2024 1348   GLUCOSEU NEGATIVE 04/28/2024 1348   HGBUR SMALL (A) 04/28/2024 1348   BILIRUBINUR NEGATIVE 04/28/2024 1348   KETONESUR NEGATIVE 04/28/2024 1348   PROTEINUR 100 (A) 04/28/2024 1348   NITRITE NEGATIVE 04/28/2024 1348   LEUKOCYTESUR NEGATIVE 04/28/2024 1348    Last A1C:  Lab Results  Component Value Date   HGBA1C 4.3 (L) 04/28/2024     Radiologic Exams on Admission:   DG Chest Portable 1 View Result Date: 05/22/2024 EXAM: 1 VIEW(S) XRAY OF THE CHEST 05/22/2024 01:50:00 PM COMPARISON: 05/04/2024 CLINICAL HISTORY: chest pain FINDINGS: LINES, TUBES AND DEVICES: Right chest dialysis catheter in place. Left subclavian stent noted. LUNGS AND PLEURA: Diffusely increased interstitial prominence. No focal pulmonary airspace opacity. No pleural effusion. No pneumothorax. HEART AND MEDIASTINUM: Calcified aorta. BONES AND SOFT TISSUES: No acute osseous abnormality. IMPRESSION: 1. Increased interstitial prominence concerning interstitial edema. No significant pleural effusion or consolidative change. Electronically signed by: Waddell Calk MD 05/22/2024 02:50 PM EST RP Workstation: [id number]   CT Head Wo Contrast Result Date:  05/22/2024 EXAM: CT HEAD WITHOUT CONTRAST 05/22/2024 02:01:07 PM TECHNIQUE: CT of the head was performed without the administration of intravenous contrast. Automated exposure control, iterative reconstruction, and/or weight based adjustment of the mA/kV was utilized to reduce the radiation dose to as low as reasonably achievable. COMPARISON: head CT 04/28/2024 CLINICAL HISTORY: Mental status change, unknown cause FINDINGS: BRAIN AND VENTRICLES: Asymmetric low density extra-axial fluid over the right frontal convexity is unchanged, measuring up to 8 mm in thickness with associated slight mass effect on the frontal lobe and compatible with a subdural hygroma or chronic hematoma. No  significant midline shift, acute intracranial hemorrhage, acute infarct, hydrocephalus, or mass is identified. There is mild cerebral atrophy. Cerebral white matter hypodensities are unchanged and nonspecific but compatible with mild chronic small vessel ischemic disease. Calcified atherosclerosis at the skull base. ORBITS: No acute abnormality. SINUSES: Minimal mucosal thickening in the paranasal sinuses. Clear mastoid air cells. SOFT TISSUES AND SKULL: No acute soft tissue abnormality. No skull fracture. IMPRESSION: 1. No acute intracranial abnormality. 2. Unchanged small right frontal subdural hygroma or chronic subdural hematoma. 3. Mild chronic small vessel ischemic disease. Electronically signed by: Dasie Hamburg MD 05/22/2024 02:23 PM EST RP Workstation: [id number]    EKG:   Independently reviewed.  Orders placed or performed during the hospital encounter of 05/22/24   ED EKG   ED EKG   EKG 12-Lead   EKG 12-Lead   EKG 12-Lead   ---------------------------------------------------------------------------------------------------------------------------------------    Assessment / Plan:   Principal Problem:   Postural dizziness with presyncope Active Problems:   Altered mental status   Hypokalemia   Chronic gouty  arthritis   Essential hypertension   Pure hypercholesterolemia   DM (diabetes mellitus), type 2 (HCC)   ESRD (end stage renal disease) (HCC)   Elevated troponin   Assessment and Plan: * Postural dizziness with presyncope Brief altered mental status during hemodialysis:  Presyncope versus syncope, versus intra dialysis hypotension, hypoxic -Monitor closely -Continue neurochecks -CT head reviewed - Obtaining echocardiogram -Obtaining bilateral carotid studies - Continue to monitor on a monitored bed - Orthostatic BP checks  Head CT -no acute intracranial changes. Unchanged small right frontal subdural hygroma or chronic subdural hematoma     Hypokalemia Monitoring serum potassium closely currently at 2.4, 3.2 -Replating with 20 mEq of Kcl, check a magnesium level   Altered mental status Workup as above, monitoring closely -Currently awake alert oriented x 3 in no acute distress -No new focal neurological findings  Elevated troponin Likely ischemic demand -on hemodialysis -Denies any chest pain, no acute EKG changes -Monitor closely -Second cardiac enzymes - Obtain 2D echocardiogram - As needed nitroglycerin, aspirin , analgesics.   ESRD (end stage renal disease) (HCC) EDP notified nephrology -Pending reevaluation, to reinitiate hemodialysis in the time of tomorrow morning -Appreciate nephrology evaluation and recommendations    Essential hypertension Blood pressure controlled,  Monitoring for hypertension associated with dialysis -Continue home medication of Coreg  25 mg twice daily and amlodipine  10 mg nightly -As needed IV hydralazine    Type 2 diabetes mellitus -Episodic hypoglycemia A1c 4.3 on 04/28/24 -On last admission long-acting insulin  Lantus  has been discontinued -Continue checking CBG q. ACHS, SSI coverage   HLD Lipitor 40 mg daily   Chronic macrocytic anemia Hemoglobin at baseline between 9 and 10.  Today hemoglobin is stable at 11.9.   H/o  gout Continue allopurinol .   Diabetic neuropathy Gabapentin was held on last admission due to confusion, encephalopathy.   Overweight Monitoring daily weight, currently at 99.2 Inaccurate due to volume overload Patient will benefit from weight loss in the future   Impaired mobility:  Consulting PT/OT -On her last admission was recommended for SNF   Status on medical records Indicating DNR limited no intubation     Consults called: Nephrology -------------------------------------------------------------------------------------------------------------------------------------------- DVT prophylaxis:  heparin  injection 5,000 Units Start: 05/22/24 1545 SCDs Start: 05/22/24 1533   Code Status:   Code Status: Full Code   Admission status: Patient will be admitted as Observation, with a greater than 2 midnight length of stay. Level of care: Telemetry   Family Communication:  none  at bedside  (The above findings and plan of care has been discussed with patient in detail, the patient expressed understanding and agreement of above plan)  --------------------------------------------------------------------------------------------------------------------------------------------------  Disposition Plan:  Anticipated 1-2 days Status is: Observation The patient remains OBS appropriate and will d/c before 2 midnights.    ----------------------------------------------------------------------------------------------------------------------------------------------------  Time spent:  31  Min.  Was spent seeing and evaluating the patient, reviewing all medical records, drawn plan of care.  SIGNED: Adriana DELENA Grams, MD, FHM. FAAFP. [place] [facility], Pager  (Please use [email or web address] to page/ or secure chat through epic) If 7PM-7AM, please contact night-coverage [email or web address],  05/22/2024, 3:58 PM      [1]  Allergies Allergen Reactions   Nsaids Other (See Comments)     Contraindication due to CKD    Sertraline  Other (See Comments)    Hallucinations    "

## 2024-05-22 NOTE — Assessment & Plan Note (Addendum)
-   Brief altered mental status during hemodialysis - suspect component of convulsive syncope as etiology of admission - still orthostatic 12/30 when checked also - started on midodrine  with HD on 12/31 due to ongoing dizziness with transfers and hypotension in general - BP regimen further adjusted.  Off all antihypertensives at this time and midodrine  also being held for now; pressure currently holding steady - tolerated HD on 1/3 with on midodrine

## 2024-05-22 NOTE — ED Provider Notes (Signed)
 " [facility] Provider Note   CSN: [id number] Arrival date & time: 05/22/24  1308     Patient presents with: Altered Mental Status   Kelly Dunn is a 78 y.o. male.   HPI 78 year old male presents with altered mental status.  History is initially from the EMT who brought patient over as well as from the patient himself.  He was at dialysis today and while getting dialysis he apparently became unresponsive.  1 nurse was about to start CPR but the other nurse sternal rubbed him and he eventually woke up.  His monitor went crazy around this time and he was given a 500 cc fluid bolus.  It is unclear what his blood pressure was as they did not check it until after the bolus.  It is also unclear what happened on the monitor.  At 1 point he was transiently hypoxic for EMS and they put him on oxygen but he is no longer on oxygen.   Patient tells me that he woke up this morning and was feeling generally weak/fatigued.  Symptoms do seem worse currently.  He remembers being at dialysis but does not remember what happened there.  He denies any headache, shortness of breath, focal weakness.  He does feel like he had some transient chest pain while with EMS but did not tell them.  That is now gone.  Prior to Admission medications  Medication Sig Start Date End Date Taking? Authorizing Provider  acetaminophen  (TYLENOL ) 325 MG tablet Take 2 tablets (650 mg total) by mouth every 6 (six) hours as needed for mild pain (pain score 1-3) (or Fever >/= 101). 05/11/24   Arlice Reichert, MD  allopurinol  (ZYLOPRIM ) 100 MG tablet Take 1 tablet (100 mg total) by mouth daily. 05/12/24   Arlice Reichert, MD  amLODipine  (NORVASC ) 10 MG tablet Take 10 mg by mouth daily.    [provider]  aspirin  81 MG tablet Take 81 mg by mouth daily.    [provider]  atorvastatin  (LIPITOR) 40 MG tablet Take 40 mg by mouth daily.    [provider]  bisacodyl   (DULCOLAX) 5 MG EC tablet Take 1 tablet (5 mg total) by mouth daily as needed for moderate constipation. 05/11/24   Arlice Reichert, MD  calcitRIOL  (ROCALTROL ) 0.25 MCG capsule Take 0.25 mcg by mouth 3 (three) times a week. 03/29/24   [provider]  carvedilol  (COREG ) 25 MG tablet Take 25 mg by mouth daily at 12 noon.    [provider]  Cholecalciferol  (VITAMIN D3) 5000 units CAPS Take 5,000 Units by mouth daily.    [provider]  Darbepoetin Alfa  (ARANESP ) 100 MCG/0.5ML SOSY injection Inject 0.5 mLs (100 mcg total) into the skin every Saturday at 6 PM. 05/13/24   Dahal, Reichert, MD  FEROSUL 325 (65 Fe) MG tablet Take 325 mg by mouth daily. 04/10/24   [provider]  ipratropium (ATROVENT ) 0.02 % nebulizer solution Take 2.5 mLs (0.5 mg total) by nebulization every 6 (six) hours as needed for wheezing. 05/11/24   Arlice Reichert, MD  Multiple Vitamin (MULTIVITAMIN WITH MINERALS) TABS tablet Take 1 tablet by mouth daily. 05/12/24   Arlice Reichert, MD  oxyCODONE  (OXY IR/ROXICODONE ) 5 MG immediate release tablet Take 1 tablet (5 mg total) by mouth every 6 (six) hours as needed for moderate pain (pain score 4-6). 05/11/24   Dahal, Reichert, MD  senna-docusate (SENOKOT-S) 8.6-50 MG tablet Take 1 tablet by mouth at bedtime  as needed for mild constipation. 05/11/24   Arlice Reichert, MD  sertraline  (ZOLOFT ) 25 MG tablet Take 25 mg by mouth daily. Patient not taking: Reported on 04/28/2024    [provider]    Allergies: Nsaids and Sertraline     Review of Systems  Constitutional:  Positive for fatigue. Negative for fever.  Respiratory:  Negative for shortness of breath.   Cardiovascular:  Positive for chest pain.  Gastrointestinal:  Negative for abdominal pain.  Neurological:  Positive for weakness. Negative for headaches.    Updated Vital Signs BP (!) 149/62   Pulse 78   Temp (!) 97.5 F (36.4 C) (Oral)   Resp 10   SpO2 98%   Physical Exam Vitals and  nursing note reviewed.  Constitutional:      General: He is not in acute distress.    Appearance: He is well-developed. He is not ill-appearing or diaphoretic.  HENT:     Head: Normocephalic and atraumatic.  Eyes:     Extraocular Movements: Extraocular movements intact.     Pupils: Pupils are equal, round, and reactive to light.  Cardiovascular:     Rate and Rhythm: Normal rate and regular rhythm.     Heart sounds: Normal heart sounds.  Pulmonary:     Effort: Pulmonary effort is normal.     Breath sounds: Normal breath sounds.  Abdominal:     Palpations: Abdomen is soft.     Tenderness: There is no abdominal tenderness.  Skin:    General: Skin is warm and dry.  Neurological:     Mental Status: He is alert and oriented to person, place, and time.     Comments: Patient is awake but appears sleepy.  However he is able to answer all of my questions. CN 3-12 grossly intact. 5/5 strength in all 4 extremities. Grossly normal sensation. Normal finger to nose.      (all labs ordered are listed, but only abnormal results are displayed) Labs Reviewed  CBC WITH DIFFERENTIAL/PLATELET - Abnormal; Notable for the following components:      Result Value   RBC 3.34 (*)    Hemoglobin 10.8 (*)    HCT 33.7 (*)    MCV 100.9 (*)    Platelets 101 (*)    Lymphs Abs 0.5 (*)    All other components within normal limits  COMPREHENSIVE METABOLIC PANEL WITH GFR - Abnormal; Notable for the following components:   Potassium 3.4 (*)    Chloride 97 (*)    Glucose, Bld 168 (*)    Creatinine, Ser 3.91 (*)    Calcium  8.6 (*)    GFR, Estimated 15 (*)    All other components within normal limits  TROPONIN T, HIGH SENSITIVITY - Abnormal; Notable for the following components:   Troponin T High Sensitivity 240 (*)    All other components within normal limits  EXPECTORATED SPUTUM ASSESSMENT W GRAM STAIN, RFLX TO RESP C  URINALYSIS, ROUTINE W REFLEX MICROSCOPIC  MAGNESIUM  PHOSPHORUS  PROTIME-INR  I-STAT  VENOUS BLOOD GAS, ED  CBG MONITORING, ED  TROPONIN T, HIGH SENSITIVITY    EKG: EKG Interpretation Date/Time:  Monday May 22 2024 13:24:11 EST Ventricular Rate:  84 PR Interval:  227 QRS Duration:  91 QT Interval:  374 QTC Calculation: 443 R Axis:   -4  Text Interpretation: Sinus rhythm Prolonged PR interval Inferior infarct, age indeterminate Anterior infarct, old Confirmed by Freddi Hamilton [phone]) on 05/22/2024 2:19:07 PM  Radiology: [facility] Chest Portable 1 View Result  Date: 05/22/2024 EXAM: 1 VIEW(S) XRAY OF THE CHEST 05/22/2024 01:50:00 PM COMPARISON: 05/04/2024 CLINICAL HISTORY: chest pain FINDINGS: LINES, TUBES AND DEVICES: Right chest dialysis catheter in place. Left subclavian stent noted. LUNGS AND PLEURA: Diffusely increased interstitial prominence. No focal pulmonary airspace opacity. No pleural effusion. No pneumothorax. HEART AND MEDIASTINUM: Calcified aorta. BONES AND SOFT TISSUES: No acute osseous abnormality. IMPRESSION: 1. Increased interstitial prominence concerning interstitial edema. No significant pleural effusion or consolidative change. Electronically signed by: Waddell Calk MD 05/22/2024 02:50 PM EST RP Workstation: [id number]   CT Head Wo Contrast Result Date: 05/22/2024 EXAM: CT HEAD WITHOUT CONTRAST 05/22/2024 02:01:07 PM TECHNIQUE: CT of the head was performed without the administration of intravenous contrast. Automated exposure control, iterative reconstruction, and/or weight based adjustment of the mA/kV was utilized to reduce the radiation dose to as low as reasonably achievable. COMPARISON: head CT 04/28/2024 CLINICAL HISTORY: Mental status change, unknown cause FINDINGS: BRAIN AND VENTRICLES: Asymmetric low density extra-axial fluid over the right frontal convexity is unchanged, measuring up to 8 mm in thickness with associated slight mass effect on the frontal lobe and compatible with a subdural hygroma or chronic hematoma. No significant midline shift,  acute intracranial hemorrhage, acute infarct, hydrocephalus, or mass is identified. There is mild cerebral atrophy. Cerebral white matter hypodensities are unchanged and nonspecific but compatible with mild chronic small vessel ischemic disease. Calcified atherosclerosis at the skull base. ORBITS: No acute abnormality. SINUSES: Minimal mucosal thickening in the paranasal sinuses. Clear mastoid air cells. SOFT TISSUES AND SKULL: No acute soft tissue abnormality. No skull fracture. IMPRESSION: 1. No acute intracranial abnormality. 2. Unchanged small right frontal subdural hygroma or chronic subdural hematoma. 3. Mild chronic small vessel ischemic disease. Electronically signed by: Dasie Hamburg MD 05/22/2024 02:23 PM EST RP Workstation: [id number]     Procedures   Medications Ordered in the ED  heparin  injection 5,000 Units (has no administration in time range)  sodium chloride  flush (NS) 0.9 % injection 3 mL (has no administration in time range)  sodium chloride  flush (NS) 0.9 % injection 3 mL (has no administration in time range)  acetaminophen  (TYLENOL ) tablet 650 mg (has no administration in time range)    Or  acetaminophen  (TYLENOL ) suppository 650 mg (has no administration in time range)  oxyCODONE  (Oxy IR/ROXICODONE ) immediate release tablet 5 mg (has no administration in time range)  HYDROmorphone  (DILAUDID ) injection 0.5-1 mg (has no administration in time range)  traZODone  (DESYREL ) tablet 25 mg (has no administration in time range)  senna-docusate (Senokot-S) tablet 1 tablet (has no administration in time range)  bisacodyl  (DULCOLAX) EC tablet 5 mg (has no administration in time range)  sodium phosphate  (FLEET) enema 1 enema (has no administration in time range)  ondansetron  (ZOFRAN ) tablet 4 mg (has no administration in time range)    Or  ondansetron  (ZOFRAN ) injection 4 mg (has no administration in time range)  ipratropium (ATROVENT ) nebulizer solution 0.5 mg (has no administration in  time range)  hydrALAZINE  (APRESOLINE ) injection 10 mg (has no administration in time range)  potassium chloride  SA (KLOR-CON  M) CR tablet 20 mEq (has no administration in time range)  aspirin  chewable tablet 324 mg (324 mg Oral Given 05/22/24 1527)                                    Medical Decision Making Amount and/or Complexity of Data Reviewed Independent Historian: EMS External Data Reviewed: notes. Labs:  ordered.    Details: Elevated troponin 240 Radiology: ordered and independent interpretation performed.    Details: No head bleed ECG/medicine tests: ordered and independent interpretation performed.    Details: No acute ischemia  Risk OTC drugs. Decision regarding hospitalization.   Patient presents with an unresponsive episode requiring sternal rub and some sort of monitor abnormality per the dialysis team.  However the history is quite limited so it is unclear what happened.  He did have some transient chest pain and so troponin was sent which is elevated but could be from his kidney disease.  I have consulted cardiology who states they will see him but for now we will hold off on heparin .  Will give some aspirin .  He seems to be back to his baseline at this time.  Unclear if this was syncope versus some other episode.  Discussed with Dr. Willette, who will admit.     Final diagnoses:  Syncope and collapse  Chest pain with high risk for cardiac etiology    ED Discharge Orders     None          Freddi Hamilton, MD 05/22/24 1536  "

## 2024-05-22 NOTE — Hospital Course (Signed)
 Kelly Dunn is a 78 year old male with a significant history of ESRD (initiated HD 04/29/2024), Type 1 Diabetes, HTN, HLD, gout, and neuropathy. He was recently hospitalized (04/28/24) for uremic encephalopathy, which prompted the start of chronic hemodialysis.  During today's dialysis session, the patient experienced an acute, transient change in mental status. Documentation from the dialysis center and EMS indicates the patient was found altered and briefly hypoxic (requiring supplemental oxygen). Dialysis was immediately suspended, and the patient received a 500 cc fluid bolus, resulting in a rapid return to baseline consciousness.  The patient is a limited historian and has total amnesia regarding the event. He currently denies any headache, shortness of breath, or focal neurological deficits (no weakness or sensory changes).   Recent hospitalization 12/25 for progressive agitation confusion.  Was hospitalized, was found encephalopathic-uremic encephalopathy -was started on dialysis 04/29/2024.    ED Evaluation:  Blood pressure 133/64, pulse 79, temperature (!) 97.5 F (36.4 C), temperature source Oral, resp. rate 18, SpO2 98%.  LABs: VBG: 7.4/44.8/23/bicarb 30.4, potassium 3.4, 3.2, glucose 168, creatinine 3.91, calcium  8.6, ionized calcium  1.03, GFR 15, troponin 240, TIBC 225, hemoglobin 11.9  EKG: No acute abnormalities, Head CT -no acute intracranial changes. Unchanged small right frontal subdural hygroma or chronic subdural hematoma  CXR -increased interstitial prominence, edema, otherwise within normal

## 2024-05-22 NOTE — Assessment & Plan Note (Addendum)
-   repleted

## 2024-05-22 NOTE — ED Notes (Signed)
 ED Provider at bedside.

## 2024-05-22 NOTE — Assessment & Plan Note (Signed)
 Workup as above, monitoring closely -Currently awake alert oriented x 3 in no acute distress -No new focal neurological findings

## 2024-05-23 ENCOUNTER — Telehealth (HOSPITAL_COMMUNITY): Payer: Self-pay

## 2024-05-23 ENCOUNTER — Observation Stay (HOSPITAL_BASED_OUTPATIENT_CLINIC_OR_DEPARTMENT_OTHER)

## 2024-05-23 ENCOUNTER — Observation Stay (HOSPITAL_COMMUNITY)

## 2024-05-23 DIAGNOSIS — R42 Dizziness and giddiness: Secondary | ICD-10-CM | POA: Diagnosis not present

## 2024-05-23 DIAGNOSIS — E1140 Type 2 diabetes mellitus with diabetic neuropathy, unspecified: Secondary | ICD-10-CM | POA: Insufficient documentation

## 2024-05-23 DIAGNOSIS — I5030 Unspecified diastolic (congestive) heart failure: Secondary | ICD-10-CM | POA: Diagnosis not present

## 2024-05-23 DIAGNOSIS — R569 Unspecified convulsions: Secondary | ICD-10-CM | POA: Diagnosis not present

## 2024-05-23 DIAGNOSIS — R55 Syncope and collapse: Secondary | ICD-10-CM | POA: Diagnosis not present

## 2024-05-23 DIAGNOSIS — E785 Hyperlipidemia, unspecified: Secondary | ICD-10-CM | POA: Insufficient documentation

## 2024-05-23 DIAGNOSIS — Z8739 Personal history of other diseases of the musculoskeletal system and connective tissue: Secondary | ICD-10-CM

## 2024-05-23 LAB — ECHOCARDIOGRAM COMPLETE
Area-P 1/2: 2.02 cm2
Calc EF: 47.5 %
Height: 69 in
P 1/2 time: 666 ms
S' Lateral: 4.5 cm
Single Plane A2C EF: 51.7 %
Single Plane A4C EF: 41 %
Weight: 3255.75 [oz_av]

## 2024-05-23 LAB — CBC
HCT: 34 % — ABNORMAL LOW (ref 39.0–52.0)
Hemoglobin: 10.6 g/dL — ABNORMAL LOW (ref 13.0–17.0)
MCH: 31.6 pg (ref 26.0–34.0)
MCHC: 31.2 g/dL (ref 30.0–36.0)
MCV: 101.5 fL — ABNORMAL HIGH (ref 80.0–100.0)
Platelets: 108 K/uL — ABNORMAL LOW (ref 150–400)
RBC: 3.35 MIL/uL — ABNORMAL LOW (ref 4.22–5.81)
RDW: 13.9 % (ref 11.5–15.5)
WBC: 6.3 K/uL (ref 4.0–10.5)
nRBC: 0 % (ref 0.0–0.2)

## 2024-05-23 LAB — BASIC METABOLIC PANEL WITH GFR
Anion gap: 12 (ref 5–15)
BUN: 27 mg/dL — ABNORMAL HIGH (ref 8–23)
CO2: 28 mmol/L (ref 22–32)
Calcium: 9 mg/dL (ref 8.9–10.3)
Chloride: 100 mmol/L (ref 98–111)
Creatinine, Ser: 4.99 mg/dL — ABNORMAL HIGH (ref 0.61–1.24)
GFR, Estimated: 11 mL/min — ABNORMAL LOW
Glucose, Bld: 144 mg/dL — ABNORMAL HIGH (ref 70–99)
Potassium: 4.2 mmol/L (ref 3.5–5.1)
Sodium: 139 mmol/L (ref 135–145)

## 2024-05-23 LAB — HEPATITIS B SURFACE ANTIGEN: Hepatitis B Surface Ag: NONREACTIVE

## 2024-05-23 LAB — GLUCOSE, CAPILLARY
Glucose-Capillary: 156 mg/dL — ABNORMAL HIGH (ref 70–99)
Glucose-Capillary: 161 mg/dL — ABNORMAL HIGH (ref 70–99)

## 2024-05-23 LAB — CBG MONITORING, ED
Glucose-Capillary: 122 mg/dL — ABNORMAL HIGH (ref 70–99)
Glucose-Capillary: 187 mg/dL — ABNORMAL HIGH (ref 70–99)

## 2024-05-23 MED ORDER — CHLORHEXIDINE GLUCONATE CLOTH 2 % EX PADS
6.0000 | MEDICATED_PAD | Freq: Every day | CUTANEOUS | Status: DC
  Administered 2024-05-24 – 2024-05-29 (×4): 6 via TOPICAL

## 2024-05-23 MED ORDER — METOPROLOL SUCCINATE ER 25 MG PO TB24: 25.0000 mg | ORAL_TABLET | Freq: Every day | ORAL | Status: DC

## 2024-05-23 MED ORDER — PERFLUTREN LIPID MICROSPHERE
1.0000 mL | INTRAVENOUS | Status: AC | PRN
  Administered 2024-05-23: 3 mL via INTRAVENOUS

## 2024-05-23 MED ORDER — ORAL CARE MOUTH RINSE: 15.0000 mL | OROMUCOSAL | Status: DC | PRN

## 2024-05-23 MED ORDER — METOPROLOL SUCCINATE ER 50 MG PO TB24
50.0000 mg | ORAL_TABLET | Freq: Every day | ORAL | Status: DC
  Administered 2024-05-23: 50 mg via ORAL
  Filled 2024-05-23: qty 2

## 2024-05-23 NOTE — TOC Initial Note (Addendum)
 Transition of Care [facility]) - Initial/Assessment Note    Patient Details  Name: Kelly Dunn MRN: [id number] Date of Birth: [birth year]  [facility] CM/SW Contact:    Isaiah Public, LCSWA Phone Number: 05/23/2024, 3:38 PM  Clinical Narrative:                  CSW received consult for possible SNF placement at time of discharge. Due to patients current orientation CSW spoke with patients daughter Sharlet regarding PT recommendation of SNF placement for patient at time of discharge.Patients daughter reports PTA patient was at home alone. Patient daughter informed CSW patient recently discharged from [facility] SNF.Patients daughter expressed understanding of PT recommendation and is agreeable to SNF placement at time of discharge. Patient reports preference for [facility] if they still have a bed available . Patients daughter gave CSW permission to fax out patient for SNF placement to other facility's just in case [facility] unable to offer SNF bed for patient.CSW discussed insurance authorization process. All questions answered.No further questions reported at this time. Patients daughter Sharlet request for CSW to follow up with patients daughter [name] when SNF bed offers come in. Sharlet confirmed she will update [name] on PT recommendations for patient/dc plan for patient.CSW to continue to follow and assist with discharge planning needs.   Update- Kia with [facility] checking to see if facility can offer SNF bed for patient.  Expected Discharge Plan: Skilled Nursing Facility Barriers to Discharge: Continued Medical Work up   Patient Goals and CMS Choice     Choice offered to / list presented to : Adult Children (daughter Sharlet)      Expected Discharge Plan and Services In-house Referral: Clinical Social Work     Living arrangements for the past 2 months:  (from [facility] short term lives at home alone)                                      Prior Living Arrangements/Services Living  arrangements for the past 2 months:  (from [facility] short term lives at home alone) Lives with:: Self Patient language and need for interpreter reviewed:: Yes        Need for Family Participation in Patient Care: Yes (Comment)     Criminal Activity/Legal Involvement Pertinent to Current Situation/Hospitalization: No - Comment as needed  Activities of Daily Living   ADL Screening (condition at time of admission) Independently performs ADLs?: Yes (appropriate for developmental age) Is the patient deaf or have difficulty hearing?: No Does the patient have difficulty seeing, even when wearing glasses/contacts?: No Does the patient have difficulty concentrating, remembering, or making decisions?: No  Permission Sought/Granted Permission sought to share information with : Case Manager, Magazine Features Editor, Family Supports                Emotional Assessment       Orientation: : Oriented to Self, Oriented to Place, Oriented to  Time Alcohol / Substance Use: Not Applicable Psych Involvement: No (comment)  Admission diagnosis:  Syncope and collapse [R55] Chest pain with high risk for cardiac etiology [R07.9] Postural dizziness with presyncope [R42, R55] Patient Active Problem List   Diagnosis Date Noted   Postural dizziness with presyncope 05/22/2024   ESRD (end stage renal disease) (HCC) 05/22/2024   Hypokalemia 05/22/2024   Elevated troponin 05/22/2024   Syncope and collapse 05/22/2024   Paranoia (HCC) 04/29/2024   Altered mental status  04/28/2024   DM (diabetes mellitus), type 2 (HCC) 04/28/2024   Anemia due to stage 5 chronic kidney disease treated with erythropoietin (HCC) 02/02/2024   Chronic gouty arthritis 08/17/2020   Diabetic renal disease (HCC) 08/17/2020   Essential hypertension 08/17/2020   Hyperparathyroidism due to renal insufficiency 08/17/2020   Morbid obesity (HCC) 08/17/2020   Pure hypercholesterolemia 08/17/2020   PCP:  Regino Slater,  MD Pharmacy:   [facility] [address]), [place] - [address] [place] ([place]) [name] [address] Phone: [phone] Fax: [phone]  [facility] [address] [name] [address] Phone: [phone] Fax: [phone]  [facility] [place], [name] - [address] [name] [address] Phone: [phone] Fax: [phone]     Social Drivers of Health (SDOH) Social History: SDOH Screenings   Food Insecurity: No Food Insecurity (05/23/2024)  Housing: Low Risk (05/23/2024)  Transportation Needs: No Transportation Needs (05/23/2024)  Utilities: Not At Risk (05/23/2024)  Social Connections: Socially Isolated (05/23/2024)  Tobacco Use: Medium Risk (05/22/2024)   SDOH Interventions:     Readmission Risk Interventions     No data to display

## 2024-05-23 NOTE — Progress Notes (Signed)
 Pt receives out-pt HD at [facility] on TTS 10:40 am chair time. Will assist as needed.   Randine Mungo Dialysis Navigator [phone]

## 2024-05-23 NOTE — ED Notes (Signed)
 Patient transported to MRI

## 2024-05-23 NOTE — Assessment & Plan Note (Addendum)
-   A1c 4.3 on 04/28/24 -On last admission long-acting insulin  Lantus  has been discontinued - diet controlled

## 2024-05-23 NOTE — ED Notes (Signed)
 PT ambulated to the bathroom with two person assistance

## 2024-05-23 NOTE — Assessment & Plan Note (Signed)
 On allopurinol

## 2024-05-23 NOTE — Assessment & Plan Note (Signed)
on statin. 

## 2024-05-23 NOTE — Evaluation (Addendum)
 Physical Therapy Evaluation Patient Details Name: Kelly Dunn MRN: [id number] DOB: [birth year] Today's Date: 05/23/2024  History of Present Illness  Pt is 78 year old presented to [facility] on  05/22/24 for unresponsiveness at HD. In ED possible seizure episode vs convulsive syncope. PMH - ESRD on HD, DM, HTN, HLD, CKDV,  Clinical Impression  Pt admitted with above diagnosis and presents to PT with functional limitations due to deficits listed below (See PT problem list). Pt needs skilled PT to maximize independence and safety. Pt with recent hospitalization and SNF stay prior to pt returning home alone. Currently mobility limited to EOB and brief standing due to orthostatic. Patient will benefit from continued inpatient follow up therapy, <3 hours/day. If mobility improves greatly once BP is improved will reconsider home with [facility].   Orthostatic BPs  Supine 113/51  Sitting 99/54  Sitting after 3 min 94/53  Standing Did not stand long enough to take              If plan is discharge home, recommend the following: A lot of help with walking and/or transfers;A little help with bathing/dressing/bathroom;Help with stairs or ramp for entrance;Assist for transportation   Can travel by private vehicle   Yes    Equipment Recommendations None recommended by PT  Recommendations for Other Services       Functional Status Assessment Patient has had a recent decline in their functional status and demonstrates the ability to make significant improvements in function in a reasonable and predictable amount of time.     Precautions / Restrictions Precautions Precautions: Fall;Other (comment) Recall of Precautions/Restrictions: Intact Precaution/Restrictions Comments: orthostatic Restrictions Weight Bearing Restrictions Per Provider Order: No      Mobility  Bed Mobility Overal bed mobility: Needs Assistance Bed Mobility: Supine to Sit, Sit to Supine     Supine to sit: Supervision,  HOB elevated, Used rails Sit to supine: Supervision, HOB elevated        Transfers Overall transfer level: Needs assistance Equipment used: 1 person hand held assist Transfers: Sit to/from Stand Sit to Stand: Min assist           General transfer comment: Assist to power up and for balance    Ambulation/Gait               General Gait Details: Did not attempt due to orthostatic  Stairs            Wheelchair Mobility     Tilt Bed    Modified Rankin (Stroke Patients Only)       Balance Overall balance assessment: Needs assistance Sitting-balance support: No upper extremity supported, Feet supported Sitting balance-Leahy Scale: Good     Standing balance support: Bilateral upper extremity supported, During functional activity Standing balance-Leahy Scale: Poor Standing balance comment: UE support and min assist for static standing                             Pertinent Vitals/Pain Pain Assessment Pain Assessment: No/denies pain    Home Living Family/patient expects to be discharged to:: Private residence Living Arrangements: Alone   Type of Home: House Home Access: Stairs to enter Entrance Stairs-Rails: Right Entrance Stairs-Number of Steps: 2   Home Layout: One level Home Equipment: Cane - single Librarian, Academic (2 wheels) Additional Comments: Pt reports he returned home from SNF. Uses transportation to/from HD.    Prior Function Prior Level of Function : Independent/Modified Independent  Mobility Comments: Uses cane or walker       Extremity/Trunk Assessment   Upper Extremity Assessment Upper Extremity Assessment: Generalized weakness    Lower Extremity Assessment Lower Extremity Assessment: Generalized weakness       Communication   Communication Communication: No apparent difficulties    Cognition Arousal: Alert Behavior During Therapy: WFL for tasks assessed/performed   PT - Cognitive  impairments: No family/caregiver present to determine baseline                         Following commands: Intact       Cueing Cueing Techniques: Verbal cues, Tactile cues     General Comments General comments (skin integrity, edema, etc.): See assessment for BP's    Exercises     Assessment/Plan    PT Assessment Patient needs continued PT services  PT Problem List Decreased strength;Decreased activity tolerance;Decreased balance;Decreased mobility;Cardiopulmonary status limiting activity       PT Treatment Interventions DME instruction;Gait training;Functional mobility training;Stair training;Therapeutic activities;Therapeutic exercise;Balance training;Patient/family education    PT Goals (Current goals can be found in the Care Plan section)  Acute Rehab PT Goals Patient Stated Goal: return home PT Goal Formulation: With patient Time For Goal Achievement: 06/06/24 Potential to Achieve Goals: Good    Frequency Min 2X/week     Co-evaluation               AM-PAC PT 6 Clicks Mobility  Outcome Measure Help needed turning from your back to your side while in a flat bed without using bedrails?: A Little Help needed moving from lying on your back to sitting on the side of a flat bed without using bedrails?: A Little Help needed moving to and from a bed to a chair (including a wheelchair)?: A Little Help needed standing up from a chair using your arms (e.g., wheelchair or bedside chair)?: A Little Help needed to walk in hospital room?: Total Help needed climbing 3-5 steps with a railing? : Total 6 Click Score: 14    End of Session Equipment Utilized During Treatment: Gait belt Activity Tolerance: Treatment limited secondary to medical complications (Comment) (orthostatic) Patient left: in bed;with bed alarm set;Other (comment) (transport taking him to the floor)   PT Visit Diagnosis: Unsteadiness on feet (R26.81);Other abnormalities of gait and mobility  (R26.89);Muscle weakness (generalized) (M62.81);Difficulty in walking, not elsewhere classified (R26.2)    Time: 8776-8766 PT Time Calculation (min) (ACUTE ONLY): 10 min   Charges:   PT Evaluation $PT Eval Moderate Complexity: 1 Mod   PT General Charges $$ ACUTE PT VISIT: 1 Visit         [facility] PT Acute Rehabilitation Services Office [phone]   Rodgers ORN [facility] 05/23/2024, 1:38 PM

## 2024-05-23 NOTE — ED Notes (Signed)
 Upon returning to his room, PT was placed in a wheelchair. When he got inside of the room, while still in the wheelchair, PT's eyes got big, he stiffened up,leaned to the side and started shaking for about 1 min. PT was placed in bed with assistance from a NT and another RN.Suction was hooked up and vs taken.Admitting physician was contacted

## 2024-05-23 NOTE — Progress Notes (Signed)
"  °  Progress Note  Patient Name: Kelly Dunn Date of Encounter: 05/23/2024 [facility] Cardiologist: None   Interval Summary   Overnight nurse noted shaking convulsion like for about 1 minute, returned to baseline at 5 minutes.  There was suspicion for possible seizure.  EEG ordered.  MRI of unremarkable.  No prior history of seizure.  Still feels a little dizzy.   Vital Signs Vitals:   05/23/24 0124 05/23/24 0125 05/23/24 0201 05/23/24 0300  BP:  (!) 103/48 (!) 100/48 (!) 108/45  Pulse:  74 63 66  Resp:  20  12  Temp:  98.2 F (36.8 C) 98.6 F (37 C)   TempSrc:  Oral Oral   SpO2:  98% 93% 100%  Weight: 99.2 kg     Height: 6' (1.829 m)      No intake or output data in the 24 hours ending 05/23/24 0521    05/23/2024    1:24 AM 05/09/2024    7:51 AM 05/09/2024    4:47 AM  Last 3 Weights  Weight (lbs) 218 lb 11.1 oz 218 lb 11.1 oz 209 lb 7 oz  Weight (kg) 99.2 kg 99.2 kg 95 kg      Telemetry/ECG  Sinus rhythm, no adverse arrhythmias detected- Personally Reviewed  Physical Exam  GEN: No acute distress.   Neck: No JVD Cardiac: RRR, no murmurs, rubs, or gallops.  Respiratory: Clear to auscultation bilaterally.,  Right subclavian dialysis catheter GI: Soft, nontender, non-distended  MS: No edema  Assessment & Plan   78 year old male with new start dialysis fairly recently secondary to end-stage renal disease with recent hospitalization for encephalopathic symptoms who was noted to have transient loss of consciousness at dialysis center but seemed to respond to fluid bolus.  We were called because of potential chest pain and elevated troponin.  Elevated troponin - Troponin initially was 240 and 2 subsequent values were 334 and 322.  Low level elevation compatible with myocardial injury in the setting of end-stage renal disease and perhaps a contribution from potential transient hypotension.  This is not appear to be ACS.  Nonetheless, elevated troponin  does portend a worsened prognosis. - Echocardiogram pending.  Lets ensure proper structure and function of his heart. - Telemetry monitoring has been normal.  Loss of consciousness/sensorium - He described to me a sensation of blowing up then does not recall anything until he was on the ambulance.  Per report, sternal rub seem to help as well as IV fluids.  Overnight noted by nurse sudden rigidity and shaking with suspicion of seizure activity.  MRI unremarkable.  EEG pending.  Once again etiology unknown.  No arrhythmia detected during episode.  Urinalysis, urine toxicology unremarkable. --Dizzy, BP 100 SBP. Will stop amlodipine  10mg  and stop coreg  and replace with Toprol  XL 50 mg once a day.   Peripheral vascular disease - Left subclavian stent noted.  Vascular following.  End-stage renal disease on hemodialysis - Nephrology following.   For questions or updates, please contact [facility] Please consult [email or web address] for contact info under         Signed, Oneil Parchment, MD   "

## 2024-05-23 NOTE — Progress Notes (Signed)
 " Progress Note    Kelly Dunn   FMW:[id number]  DOB: 1945/09/23  DOA: 05/22/2024     0 PCP: Regino Slater, MD  Initial CC: unresponsive   Hospital Course: Verdie Barrows is a 78 year old male with a significant history of ESRD (initiated HD 04/29/2024), Type 1 Diabetes, HTN, HLD, gout, and neuropathy. He was recently hospitalized (04/28/24) for uremic encephalopathy, which prompted the start of chronic hemodialysis.  During dialysis prior to admission he became transiently altered and briefly hypoxic.  Dialysis was aborted and he received small fluid bolus with return to baseline mentation.  Interval History:  Resting comfortably in bed this morning.  Denies any dizziness or lightheadedness.  Assessment and Plan: * Postural dizziness with presyncope Brief altered mental status during hemodialysis - suspect component of convulsive syncope as etiology of admission - still orthostatic 12/30 when checked also - may need midodrine with HD, but will defer to nephrology   Hypokalemia - replete as needed   Altered mental status - Strongly suspect hypotension contributing - Orthostatics very positive when checked on 12/30 also - now back to normal mentation  ESRD (end stage renal disease) (HCC) - Nephrology following - Dialysis per nephrology -BP medications have been modified  Diabetic neuropathy (HCC) - Gabapentin held last admission due to confusion and encephalopathy  History of gout - On allopurinol   HLD (hyperlipidemia) - on statin  Elevated troponin Likely ischemic demand; combo of hypotension and HD - Appreciate cardiology evaluation as well -Echo obtained, EF 45 to 50%, global hypokinesis, grade 1 DD  DM (diabetes mellitus), type 2 (HCC) - A1c 4.3 on 04/28/24 -On last admission long-acting insulin  Lantus  has been discontinued -Continue checking CBG q. ACHS, SSI coverage    Antimicrobials:   DVT prophylaxis:  heparin  injection 5,000 Units  Start: 05/22/24 1545 SCDs Start: 05/22/24 1533   Code Status:   Code Status: Full Code  Mobility Assessment (Last 72 Hours)     Mobility Assessment     Row Name 05/23/24 1300           What is the highest level of mobility based on the mobility assessment? Level 3 (Stands with assistance) - Balance while standing  and cannot march in place          Diet: Diet Orders (From admission, onward)     Start     Ordered   05/22/24 1534  Diet regular Room service appropriate? Yes; Fluid consistency: Thin  Diet effective now       Question Answer Comment  Room service appropriate? Yes   Fluid consistency: Thin      05/22/24 1534            Barriers to discharge: None Disposition Plan: Home HH orders placed: TBD Status is: Observation  Objective: Blood pressure (!) 100/47, pulse 68, temperature 98.3 F (36.8 C), temperature source Axillary, resp. rate 16, height 5' 9 (1.753 m), weight 92.3 kg, SpO2 100%.  Examination:  Physical Exam Constitutional:      General: He is not in acute distress.    Appearance: Normal appearance.  HENT:     Head: Normocephalic and atraumatic.     Mouth/Throat:     Mouth: Mucous membranes are moist.  Eyes:     Extraocular Movements: Extraocular movements intact.  Cardiovascular:     Rate and Rhythm: Normal rate and regular rhythm.  Pulmonary:     Effort: Pulmonary effort is normal. No respiratory distress.     Breath sounds: Normal breath sounds.  No wheezing.  Abdominal:     General: Bowel sounds are normal. There is no distension.     Palpations: Abdomen is soft.     Tenderness: There is no abdominal tenderness.  Musculoskeletal:        General: Normal range of motion.     Cervical back: Normal range of motion and neck supple.  Skin:    General: Skin is warm and dry.  Neurological:     General: No focal deficit present.     Mental Status: He is alert.  Psychiatric:        Mood and Affect: Mood normal.        Behavior:  Behavior normal.      Consultants:  Cardiology Nephrology  Procedures:    Data Reviewed: Results for orders placed or performed during the hospital encounter of 05/22/24 (from the past 24 hours)  CBG monitoring, ED     Status: Abnormal   Collection Time: 05/22/24  5:27 PM  Result Value Ref Range   Glucose-Capillary 148 (H) 70 - 99 mg/dL  Troponin T, High Sensitivity     Status: Abnormal   Collection Time: 05/22/24  5:36 PM  Result Value Ref Range   Troponin T High Sensitivity 322 (HH) 0 - 19 ng/L  Magnesium     Status: None   Collection Time: 05/22/24  5:37 PM  Result Value Ref Range   Magnesium 1.9 1.7 - 2.4 mg/dL  Phosphorus     Status: None   Collection Time: 05/22/24  5:37 PM  Result Value Ref Range   Phosphorus 3.9 2.5 - 4.6 mg/dL  CBG monitoring, ED     Status: Abnormal   Collection Time: 05/22/24 10:19 PM  Result Value Ref Range   Glucose-Capillary 168 (H) 70 - 99 mg/dL  CBC     Status: Abnormal   Collection Time: 05/23/24  4:05 AM  Result Value Ref Range   WBC 6.3 4.0 - 10.5 K/uL   RBC 3.35 (L) 4.22 - 5.81 MIL/uL   Hemoglobin 10.6 (L) 13.0 - 17.0 g/dL   HCT 65.9 (L) 60.9 - 47.9 %   MCV 101.5 (H) 80.0 - 100.0 fL   MCH 31.6 26.0 - 34.0 pg   MCHC 31.2 30.0 - 36.0 g/dL   RDW 86.0 88.4 - 84.4 %   Platelets 108 (L) 150 - 400 K/uL   nRBC 0.0 0.0 - 0.2 %  Basic metabolic panel     Status: Abnormal   Collection Time: 05/23/24  4:05 AM  Result Value Ref Range   Sodium 139 135 - 145 mmol/L   Potassium 4.2 3.5 - 5.1 mmol/L   Chloride 100 98 - 111 mmol/L   CO2 28 22 - 32 mmol/L   Glucose, Bld 144 (H) 70 - 99 mg/dL   BUN 27 (H) 8 - 23 mg/dL   Creatinine, Ser 5.00 (H) 0.61 - 1.24 mg/dL   Calcium  9.0 8.9 - 10.3 mg/dL   GFR, Estimated 11 (L) >60 mL/min   Anion gap 12 5 - 15  CBG monitoring, ED     Status: Abnormal   Collection Time: 05/23/24  7:46 AM  Result Value Ref Range   Glucose-Capillary 122 (H) 70 - 99 mg/dL  CBG monitoring, ED     Status: Abnormal    Collection Time: 05/23/24 11:51 AM  Result Value Ref Range   Glucose-Capillary 187 (H) 70 - 99 mg/dL  Glucose, capillary     Status: Abnormal   Collection Time: 05/23/24  12:55 PM  Result Value Ref Range   Glucose-Capillary 156 (H) 70 - 99 mg/dL  Hepatitis B surface antigen     Status: None   Collection Time: 05/23/24  4:00 PM  Result Value Ref Range   Hepatitis B Surface Ag NON REACTIVE NON REACTIVE  Glucose, capillary     Status: Abnormal   Collection Time: 05/23/24  4:43 PM  Result Value Ref Range   Glucose-Capillary 161 (H) 70 - 99 mg/dL    I have reviewed pertinent nursing notes, vitals, labs, and images as necessary. I have ordered labwork to follow up on as indicated.  I have reviewed the last notes from staff over past 24 hours. I have discussed patient's care plan and test results with nursing staff, CM/SW, and other staff as appropriate.  Old records reviewed in assessment of this patient  Time spent: Greater than 50% of the 55 minute visit was spent in counseling/coordination of care for the patient as laid out in the A&P.   LOS: 0 days   Alm Apo, MD [facility] 05/23/2024, 5:07 PM "

## 2024-05-23 NOTE — ED Notes (Signed)
 Pt unsteady while taking orthostatic vitals while standing. Pt stated he felt a little light headed and dizzy. Nurse notified. Pt back in bed, bed alarm on, with bed in low position and call bell in reach.

## 2024-05-23 NOTE — Progress Notes (Addendum)
 Patient was observed by RN to become stiff and then shake for ~1 minute. He returned to baseline after ~5 minutes.   He does not appear to have a history of seizure. Labs, EKG, head CT, and medications were reviewed; no cause for a seizure is immediately evident.   Concern for first-time seizure vs convulsive syncope. Syncope workup is already in-process, plan to add EEG and MRI brain, use seizure precautions.

## 2024-05-23 NOTE — Progress Notes (Signed)
 Echocardiogram 2D Echocardiogram has been performed.  Kelly Dunn 05/23/2024, 2:36 PM

## 2024-05-23 NOTE — Assessment & Plan Note (Signed)
-   Gabapentin held last admission due to confusion and encephalopathy

## 2024-05-23 NOTE — ED Notes (Signed)
 Delegated Orthostatic vitals to NT.

## 2024-05-23 NOTE — NC FL2 (Addendum)
 " [place]  MEDICAID FL2 LEVEL OF CARE FORM     IDENTIFICATION  Patient Name: Kelly Dunn Birthdate: 10/08/1945 Sex: male Admission Date (Current Location): 05/22/2024  [facility] and [place] Number:  Producer, Television/film/video and Address:  [facility], [address], [place], [name] [address]      Provider Number: [id number]  Attending Physician Name and Address:  Patsy Lenis, MD  Relative Name and Phone Number:  Sharlet Pinal (daughter) [phone], Shmiel Morton (daughter) [phone]    Current Level of Care: Hospital Recommended Level of Care: Skilled Nursing Facility Prior Approval Number:    Date Approved/Denied:   PASRR Number: [id number]  Discharge Plan: SNF    Current Diagnoses: Patient Active Problem List   Diagnosis Date Noted   Postural dizziness with presyncope 05/22/2024   ESRD (end stage renal disease) (HCC) 05/22/2024   Hypokalemia 05/22/2024   Elevated troponin 05/22/2024   Syncope and collapse 05/22/2024   Paranoia (HCC) 04/29/2024   Altered mental status 04/28/2024   DM (diabetes mellitus), type 2 (HCC) 04/28/2024   Anemia due to stage 5 chronic kidney disease treated with erythropoietin (HCC) 02/02/2024   Chronic gouty arthritis 08/17/2020   Diabetic renal disease (HCC) 08/17/2020   Essential hypertension 08/17/2020   Hyperparathyroidism due to renal insufficiency 08/17/2020   Morbid obesity (HCC) 08/17/2020   Pure hypercholesterolemia 08/17/2020    Orientation RESPIRATION BLADDER Height & Weight     Self, Place, Time  O2 (Nasal Cannula 2 liters)   Weight: 203 lb 7.8 oz (92.3 kg) Height:  5' 9 (175.3 cm)  BEHAVIORAL SYMPTOMS/MOOD NEUROLOGICAL BOWEL NUTRITION STATUS        Diet (Please see discharge summary)  AMBULATORY STATUS COMMUNICATION OF NEEDS Skin   Extensive Assist Verbally Other (Comment) (Wound/Incision LDAs,Wound,surgical,closed,surgical,Inc.,chest,R,upper)                        Personal Care Assistance Level of Assistance    Bathing Assistance: Limited assistance Feeding assistance: Independent Dressing Assistance: Limited assistance     Functional Limitations Info  Sight, Hearing, Speech Sight Info: Impaired (Reading Glasses)   Speech Info: Adequate    SPECIAL CARE FACTORS FREQUENCY  PT (By licensed PT), OT (By licensed OT)     PT Frequency: 5x min weekly OT Frequency: 5x min weekly            Contractures Contractures Info: Not present    Additional Factors Info  Code Status, Allergies, Psychotropic, Insulin  Sliding Scale Code Status Info: FULL Allergies Info: Nsaids,Sertraline  Psychotropic Info: melatonin tablet 5 mg daily at bedtime Insulin  Sliding Scale Info: see DC summary       Current Medications (05/23/2024):  This is the current hospital active medication list Current Facility-Administered Medications  Medication Dose Route Frequency Provider Last Rate Last Admin   acetaminophen  (TYLENOL ) tablet 650 mg  650 mg Oral Q6H PRN Shahmehdi, Seyed A, MD       Or   acetaminophen  (TYLENOL ) suppository 650 mg  650 mg Rectal Q6H PRN Shahmehdi, Seyed A, MD       allopurinol  (ZYLOPRIM ) tablet 100 mg  100 mg Oral Daily Shahmehdi, Seyed A, MD   100 mg at 05/23/24 1014   aspirin  EC tablet 81 mg  81 mg Oral Daily Shahmehdi, Seyed A, MD   81 mg at 05/23/24 1013   atorvastatin  (LIPITOR) tablet 40 mg  40 mg Oral Daily Shahmehdi, Seyed A, MD   40 mg at 05/23/24 1014   [  START ON 05/24/2024] calcitRIOL  (ROCALTROL ) capsule 0.25 mcg  0.25 mcg Oral Once per day on Monday Wednesday Friday Willette Adriana LABOR, MD       [START ON 05/24/2024] Chlorhexidine  Gluconate Cloth 2 % PADS 6 each  6 each Topical Q0600 Stovall, Kathryn R, PA-C       cholecalciferol  (VITAMIN D3) 25 MCG (1000 UNIT) tablet 5,000 Units  5,000 Units Oral Daily Shahmehdi, Seyed A, MD   5,000 Units at 05/23/24 1011   ferrous sulfate  tablet 325 mg  325 mg Oral Daily Shahmehdi, Adriana A, MD   325  mg at 05/23/24 1015   heparin  injection 5,000 Units  5,000 Units Subcutaneous Q8H Shahmehdi, Seyed A, MD   5,000 Units at 05/23/24 1430   hydrALAZINE  (APRESOLINE ) injection 10 mg  10 mg Intravenous Q4H PRN Shahmehdi, Seyed A, MD       HYDROmorphone  (DILAUDID ) injection 0.5-1 mg  0.5-1 mg Intravenous Q2H PRN Shahmehdi, Seyed A, MD       insulin  aspart (novoLOG ) injection 0-6 Units  0-6 Units Subcutaneous TID WC Shahmehdi, Seyed A, MD   1 Units at 05/23/24 1155   ipratropium (ATROVENT ) nebulizer solution 0.5 mg  0.5 mg Nebulization Q6H PRN Shahmehdi, Seyed A, MD       melatonin tablet 5 mg  5 mg Oral QHS Shahmehdi, Seyed A, MD   5 mg at 05/22/24 2129   [START ON 05/24/2024] metoprolol  succinate (TOPROL -XL) 24 hr tablet 25 mg  25 mg Oral Q supper Stovall, Kathryn R, PA-C       multivitamin with minerals tablet 1 tablet  1 tablet Oral Daily Shahmehdi, Seyed A, MD   1 tablet at 05/23/24 1015   ondansetron  (ZOFRAN ) tablet 4 mg  4 mg Oral Q6H PRN Shahmehdi, Seyed A, MD       Or   ondansetron  (ZOFRAN ) injection 4 mg  4 mg Intravenous Q6H PRN Shahmehdi, Seyed A, MD       oxyCODONE  (Oxy IR/ROXICODONE ) immediate release tablet 5 mg  5 mg Oral Q4H PRN Shahmehdi, Seyed A, MD       perflutren  lipid microspheres (DEFINITY ) IV suspension  1-10 mL Intravenous PRN Jeffrie Oneil BROCKS, MD   3 mL at 05/23/24 1435   senna-docusate (Senokot-S) tablet 1 tablet  1 tablet Oral QHS Shahmehdi, Seyed A, MD   1 tablet at 05/22/24 2129   sodium chloride  flush (NS) 0.9 % injection 3 mL  3 mL Intravenous Q12H Shahmehdi, Seyed A, MD   3 mL at 05/23/24 1016   traZODone  (DESYREL ) tablet 25 mg  25 mg Oral QHS PRN Willette Adriana LABOR, MD         Discharge Medications: Please see discharge summary for a list of discharge medications.  Relevant Imaging Results:  Relevant Lab Results:   Additional Information [id number] Patient receives out-pt HD at [facility] on TTS 10:40 am chair time.   Isaiah Public, LCSWA     "

## 2024-05-23 NOTE — Telephone Encounter (Signed)
 Auth Submission: NO AUTH NEEDED Site of care: Site of care: [facility] Payer: [employer] Medicare Medication & CPT/J Code(s) submitted: Retacrit  (V4893) Diagnosis Code: N18.5/D63.1 Route of submission (phone, fax, portal): portal Phone # Fax # Auth type: Buy/Bill HB Units/visits requested: 10000 units q2weeks Reference number: [id number] Approval from: 05/25/24 to 05/24/25

## 2024-05-23 NOTE — Progress Notes (Signed)
 EEG complete - results pending

## 2024-05-23 NOTE — Consult Note (Addendum)
 [facility] Renal Consultation Note    Indication for Consultation:  Management of ESRD/hemodialysis, anemia, hypertension/volume, and secondary hyperparathyroidism.  HPI: Kelly Dunn is a 78 y.o. male with ESRD (recently started on HD 12/6 - had presented with uremia/confusion), HTN, T2DM, HL who was admitted s/p syncopal event, now with concern for new onset seizure.  Presented to ED on 12/29 from dialysis clinic with episode of unresponsiveness/syncope with hypotension, took a while for him to come around which prompted EMS being ok. I spoke to dialysis RN, she reports IVF given ~544mL which improved BP. BS normal range during the episode as well.  Initial vitals were fairly normal, BP slightly high. Labs with Na 138, K 3.2 (post-HD), Glu 168, BUN 19, Cr 3.91, Ca 8.6, Trop 240 -> 334 -> 322, WBC 4.6, Hgb 11.9. Plts 101. Head CT without acute findings, unchanged hygroma present. CXR showed pulm edema. He was admitted for OBS status.  Cardiology was consulted for elevated troponin, felt not to be ACS but rather mild injury in setting of hypotensive event. Echo ordered which has now been done, btu not yet been read.  Per notes, this morning around 1:59am, he had event concerning for seizure (eyes got big, he stiffened up, leaned to the side and started shaking for about 1 min), returned to baseline after approx 5 minutes. MRI performed today - nothing acutely new, same R frontal chronic fluid collection.  Seen in room today. He denies every previously having a seizure. To him, the two episodes felt the same. No CP, dyspnea, abd pain, N/V/D. No skin wounds or fever/chills.  Dialyzes on TTS schedule at [facility] unit, although has only been there 3 times as just recently started on HD s/p recent [facility] admit.  Past Medical History:  Diagnosis Date   Chronic kidney disease (CKD), stage III (moderate) (HCC)    Diabetes mellitus    type II   Dyspnea    with activity    Gout    Hypercholesteremia    Hypertension    Neuropathy associated with endocrine disorder    Past Surgical History:  Procedure Laterality Date   A/V FISTULAGRAM Left 05/01/2024   Procedure: A/V Fistulagram;  Surgeon: Sheree Penne Bruckner, MD;  Location: [facility] INVASIVE CV LAB;  Service: Cardiovascular;  Laterality: Left;   A/V SHUNT INTERVENTION Left 05/01/2024   Procedure: A/V SHUNT INTERVENTION;  Surgeon: Sheree Penne Bruckner, MD;  Location: [facility] INVASIVE CV LAB;  Service: Cardiovascular;  Laterality: Left;   AV FISTULA PLACEMENT Left 07/15/2020   Procedure: Brachiocephalic Arteriovenous Fistula;  Surgeon: Harvey Carlin BRAVO, MD;  Location: [facility] OR;  Service: Vascular;  Laterality: Left;  PERIPHERAL NERVE BLOCK   FISTULA SUPERFICIALIZATION Left 08/27/2020   Procedure: LEFT ARM ARTERIOVENOUS FISTULA SUPERFICIALIZATION;  Surgeon: Harvey Carlin BRAVO, MD;  Location: [facility] OR;  Service: Vascular;  Laterality: Left;   INSERTION OF DIALYSIS CATHETER Right 05/04/2024   Procedure: RIGHT INSERTION OF TUNNELED DIALYSIS CATHETER;  Surgeon: Pearline Norman RAMAN, MD;  Location: [facility] OR;  Service: Vascular;  Laterality: Right;   Family History  Problem Relation Age of Onset   Diabetes Other    Hypertension Other    Social History:  reports that he has quit smoking. He quit smokeless tobacco use about 51 years ago. He reports that he does not drink alcohol and does not use drugs.  ROS: As per HPI otherwise negative.  Physical Exam: Vitals:   05/23/24 0907 05/23/24 0912 05/23/24 1148 05/23/24 1248  BP: (!) 66/40 ROLLEN)  121/57 (!) 113/51 (!) 93/43  Pulse: 78 69 68 68  Resp: (!) 26 20 20 20   Temp:   97.8 F (36.6 C) 98.4 F (36.9 C)  TempSrc:   Oral Oral  SpO2: 100% 100% 100% 100%  Weight:    92.3 kg  Height:    5' 9 (1.753 m)     General: Well developed, well nourished, in no acute distress. [place] O2 in place Head: Normocephalic, atraumatic, sclera non-icteric, mucus membranes are moist. Neck: Supple  without lymphadenopathy/masses. JVD not elevated. Lungs: Clear bilaterally to auscultation without wheezes, rales, or rhonchi. Breathing is unlabored. Heart: RRR with normal S1, S2. No murmurs, rubs, or gallops appreciated. Abdomen: Soft, non-tender, non-distended with normoactive bowel sounds.  Musculoskeletal:  Strength and tone appear normal for age. Lower extremities: No edema or ischemic changes, no open wounds. Neuro: Alert and oriented X 3. Moves all extremities spontaneously. Psych:  Responds to questions appropriately with a normal affect. Dialysis Access: TDC in R chest, LUE AVF +t/b with bruising s/p recent infiltration (resting).  Allergies[1] Prior to Admission medications  Medication Sig Start Date End Date Taking? Authorizing Provider  acetaminophen  (TYLENOL ) 325 MG tablet Take 2 tablets (650 mg total) by mouth every 6 (six) hours as needed for mild pain (pain score 1-3) (or Fever >/= 101). 05/11/24  Yes Dahal, Chapman, MD  allopurinol  (ZYLOPRIM ) 100 MG tablet Take 1 tablet (100 mg total) by mouth daily. 05/12/24  Yes Dahal, Chapman, MD  amLODipine  (NORVASC ) 10 MG tablet Take 10 mg by mouth daily.   Yes [provider]  aspirin  81 MG tablet Take 81 mg by mouth daily.   Yes [provider]  atorvastatin  (LIPITOR) 40 MG tablet Take 40 mg by mouth daily.   Yes [provider]  bisacodyl  (DULCOLAX) 5 MG EC tablet Take 1 tablet (5 mg total) by mouth daily as needed for moderate constipation. 05/11/24  Yes Dahal, Chapman, MD  calcitRIOL  (ROCALTROL ) 0.25 MCG capsule Take 0.25 mcg by mouth every Monday, Wednesday, and Friday. 03/29/24  Yes [provider]  carvedilol  (COREG ) 25 MG tablet Take 25 mg by mouth daily at 12 noon.   Yes [provider]  FEROSUL 325 (65 Fe) MG tablet Take 325 mg by mouth daily. 04/10/24  Yes [provider]  ipratropium (ATROVENT ) 0.02 % nebulizer solution Take 2.5 mLs (0.5 mg total) by nebulization every 6  (six) hours as needed for wheezing. 05/11/24  Yes Dahal, Chapman, MD  Multiple Vitamin (MULTIVITAMIN WITH MINERALS) TABS tablet Take 1 tablet by mouth daily. 05/12/24  Yes Dahal, Chapman, MD  oxyCODONE  (OXY IR/ROXICODONE ) 5 MG immediate release tablet Take 1 tablet (5 mg total) by mouth every 6 (six) hours as needed for moderate pain (pain score 4-6). 05/11/24  Yes Dahal, Chapman, MD  senna-docusate (SENOKOT-S) 8.6-50 MG tablet Take 1 tablet by mouth at bedtime as needed for mild constipation. 05/11/24  Yes Dahal, Chapman, MD  sertraline  (ZOLOFT ) 25 MG tablet Take 25 mg by mouth daily.   Yes [provider]   Current Facility-Administered Medications  Medication Dose Route Frequency Provider Last Rate Last Admin   acetaminophen  (TYLENOL ) tablet 650 mg  650 mg Oral Q6H PRN Shahmehdi, Seyed A, MD       Or   acetaminophen  (TYLENOL ) suppository 650 mg  650 mg Rectal Q6H PRN Shahmehdi, Seyed A, MD       allopurinol  (ZYLOPRIM ) tablet 100 mg  100 mg Oral Daily Shahmehdi, Adriana LABOR, MD  100 mg at 05/23/24 1014   aspirin  EC tablet 81 mg  81 mg Oral Daily Shahmehdi, Seyed A, MD   81 mg at 05/23/24 1013   atorvastatin  (LIPITOR) tablet 40 mg  40 mg Oral Daily Shahmehdi, Seyed A, MD   40 mg at 05/23/24 1014   [START ON 05/24/2024] calcitRIOL  (ROCALTROL ) capsule 0.25 mcg  0.25 mcg Oral Once per day on Monday Wednesday Friday Shahmehdi, Adriana LABOR, MD       cholecalciferol  (VITAMIN D3) 25 MCG (1000 UNIT) tablet 5,000 Units  5,000 Units Oral Daily Shahmehdi, Seyed A, MD   5,000 Units at 05/23/24 1011   ferrous sulfate  tablet 325 mg  325 mg Oral Daily Shahmehdi, Seyed A, MD   325 mg at 05/23/24 1015   heparin  injection 5,000 Units  5,000 Units Subcutaneous Q8H Shahmehdi, Adriana A, MD   5,000 Units at 05/23/24 9479   hydrALAZINE  (APRESOLINE ) injection 10 mg  10 mg Intravenous Q4H PRN Shahmehdi, Seyed A, MD       HYDROmorphone  (DILAUDID ) injection 0.5-1 mg  0.5-1 mg Intravenous Q2H PRN Shahmehdi, Seyed A, MD        insulin  aspart (novoLOG ) injection 0-6 Units  0-6 Units Subcutaneous TID WC Shahmehdi, Seyed A, MD   1 Units at 05/23/24 1155   ipratropium (ATROVENT ) nebulizer solution 0.5 mg  0.5 mg Nebulization Q6H PRN Shahmehdi, Adriana A, MD       melatonin tablet 5 mg  5 mg Oral QHS Shahmehdi, Seyed A, MD   5 mg at 05/22/24 2129   metoprolol  succinate (TOPROL -XL) 24 hr tablet 50 mg  50 mg Oral Daily Jeffrie Oneil BROCKS, MD   50 mg at 05/23/24 1012   multivitamin with minerals tablet 1 tablet  1 tablet Oral Daily Willette Adriana A, MD   1 tablet at 05/23/24 1015   ondansetron  (ZOFRAN ) tablet 4 mg  4 mg Oral Q6H PRN Shahmehdi, Adriana A, MD       Or   ondansetron  (ZOFRAN ) injection 4 mg  4 mg Intravenous Q6H PRN Shahmehdi, Seyed A, MD       oxyCODONE  (Oxy IR/ROXICODONE ) immediate release tablet 5 mg  5 mg Oral Q4H PRN Shahmehdi, Seyed A, MD       perflutren  lipid microspheres (DEFINITY ) IV suspension  1-10 mL Intravenous PRN Jeffrie Oneil BROCKS, MD   3 mL at 05/23/24 1435   senna-docusate (Senokot-S) tablet 1 tablet  1 tablet Oral QHS Willette Adriana A, MD   1 tablet at 05/22/24 2129   sodium chloride  flush (NS) 0.9 % injection 3 mL  3 mL Intravenous Q12H Shahmehdi, Seyed A, MD   3 mL at 05/23/24 1016   traZODone  (DESYREL ) tablet 25 mg  25 mg Oral QHS PRN Willette Adriana LABOR, MD       Labs: Basic Metabolic Panel: Recent Labs  Lab 05/22/24 1347 05/22/24 1353 05/22/24 1737 05/23/24 0405  NA 139 138  --  139  K 3.4* 3.2*  --  4.2  CL 97*  --   --  100  CO2 30  --   --  28  GLUCOSE 168*  --   --  144*  BUN 19  --   --  27*  CREATININE 3.91*  --   --  4.99*  CALCIUM  8.6*  --   --  9.0  PHOS  --   --  3.9  --    Liver Function Tests: Recent Labs  Lab 05/22/24 1347  AST 21  ALT 18  ALKPHOS 100  BILITOT 0.6  PROT 6.8  ALBUMIN 3.9   CBC: Recent Labs  Lab 05/22/24 1347 05/22/24 1353 05/23/24 0405  WBC 4.6  --  6.3  NEUTROABS 3.9  --   --   HGB 10.8* 11.9* 10.6*  HCT 33.7* 35.0* 34.0*  MCV 100.9*   --  101.5*  PLT 101*  --  108*   CBG: Recent Labs  Lab 05/22/24 1727 05/22/24 2219 05/23/24 0746 05/23/24 1151 05/23/24 1255  GLUCAP 148* 168* 122* 187* 156*   Studies/Results: EEG adult Result Date: 05/23/2024 Shelton Arlin KIDD, MD     05/23/2024 12:19 PM Patient Name: Kelly Dunn MRN: [id number] Epilepsy Attending: Arlin KIDD Shelton Referring Physician/Provider: Charlton Evalene RAMAN, MD Date: 05/23/2024 Duration: 23.46 mins Patient history: 78 yo M noted to become stiff and then shake for ~1 minute. He returned to baseline after ~5 minutes. EEG to evaluate for seizure Level of alertness: Awake, asleep AEDs during EEG study: None Technical aspects: This EEG study was done with scalp electrodes positioned according to the 10-20 International system of electrode placement. Electrical activity was reviewed with band pass filter of 1-70Hz , sensitivity of 7 uV/mm, display speed of 31mm/sec with a 60Hz  notched filter applied as appropriate. EEG data were recorded continuously and digitally stored.  Video monitoring was available and reviewed as appropriate. Description: The posterior dominant rhythm consists of 8-9 Hz activity of moderate voltage (25-35 uV) seen predominantly in posterior head regions, symmetric and reactive to eye opening and eye closing. Sleep was characterized by vertex waves, sleep spindles (12 to 14 Hz), maximal frontocentral region. EEG showed continuous 3 to 6 Hz theta-delta slowing in right fronto-temporal region. Physiologic photic driving was not seen during photic stimulation. Hyperventilation was not performed.   ABNORMALITY - Continuous slow, right fronto-temporal region IMPRESSION: This study is suggestive of cortical dysfunction arising from right fronto-temporal region likely secondary to underlying structural abnormality. No seizures or epileptiform discharges were seen throughout the recording. Arlin KIDD Shelton   MR BRAIN WO CONTRAST Result Date: 05/23/2024 EXAM: MRI  BRAIN WITHOUT CONTRAST 05/23/2024 03:53:00 AM TECHNIQUE: Multiplanar multisequence MRI of the head/brain was performed without the administration of intravenous contrast. COMPARISON: CT of the head dated 05/22/2024. CLINICAL HISTORY: Seizure, new-onset, no history of trauma. FINDINGS: BRAIN AND VENTRICLES: No acute infarct. No intracranial hemorrhage. No mass. No midline shift. No hydrocephalus. There is age-related cerebral volume loss and mild-to-moderate diffuse cerebral white matter disease. A prominent extraaxial fluid collection is again seen overlying the right frontal lobe, which is isointense with CSF and has no focal mass effect upon the underlying brain parenchyma. The sella is unremarkable. Normal flow voids. ORBITS: No acute abnormality. SINUSES AND MASTOIDS: No acute abnormality. BONES AND SOFT TISSUES: Normal marrow signal. No acute soft tissue abnormality. IMPRESSION: 1. No acute intracranial abnormality to account for the new-onset seizure. 2. Age-related cerebral volume loss and mild-to-moderate diffuse cerebral white matter disease. 3. Prominent extra-axial CSF-intensity fluid collection overlying the right frontal lobe, without focal mass effect, similar to CT head 05/22/2024. Electronically signed by: Evalene Coho MD 05/23/2024 04:00 AM EST RP Workstation: [id number]   DG Chest Portable 1 View Result Date: 05/22/2024 EXAM: 1 VIEW(S) XRAY OF THE CHEST 05/22/2024 01:50:00 PM COMPARISON: 05/04/2024 CLINICAL HISTORY: chest pain FINDINGS: LINES, TUBES AND DEVICES: Right chest dialysis catheter in place. Left subclavian stent noted. LUNGS AND PLEURA: Diffusely increased interstitial prominence. No focal pulmonary airspace opacity. No pleural effusion. No pneumothorax. HEART AND MEDIASTINUM: Calcified  aorta. BONES AND SOFT TISSUES: No acute osseous abnormality. IMPRESSION: 1. Increased interstitial prominence concerning interstitial edema. No significant pleural effusion or consolidative  change. Electronically signed by: Waddell Calk MD 05/22/2024 02:50 PM EST RP Workstation: [id number]   CT Head Wo Contrast Result Date: 05/22/2024 EXAM: CT HEAD WITHOUT CONTRAST 05/22/2024 02:01:07 PM TECHNIQUE: CT of the head was performed without the administration of intravenous contrast. Automated exposure control, iterative reconstruction, and/or weight based adjustment of the mA/kV was utilized to reduce the radiation dose to as low as reasonably achievable. COMPARISON: head CT 04/28/2024 CLINICAL HISTORY: Mental status change, unknown cause FINDINGS: BRAIN AND VENTRICLES: Asymmetric low density extra-axial fluid over the right frontal convexity is unchanged, measuring up to 8 mm in thickness with associated slight mass effect on the frontal lobe and compatible with a subdural hygroma or chronic hematoma. No significant midline shift, acute intracranial hemorrhage, acute infarct, hydrocephalus, or mass is identified. There is mild cerebral atrophy. Cerebral white matter hypodensities are unchanged and nonspecific but compatible with mild chronic small vessel ischemic disease. Calcified atherosclerosis at the skull base. ORBITS: No acute abnormality. SINUSES: Minimal mucosal thickening in the paranasal sinuses. Clear mastoid air cells. SOFT TISSUES AND SKULL: No acute soft tissue abnormality. No skull fracture. IMPRESSION: 1. No acute intracranial abnormality. 2. Unchanged small right frontal subdural hygroma or chronic subdural hematoma. 3. Mild chronic small vessel ischemic disease. Electronically signed by: Dasie Hamburg MD 05/22/2024 02:23 PM EST RP Workstation: [id number]   Dialysis Orders:  TTS - [facility] (1st HD 12/6 at [facility], only 3 outpatient HD so far) 4hr, 400/600, EDW 91kg, 2K/2Ca, TDC + AVF, heparin  2500 unit bolus - Getting course of Venofer - Calcitriol  0.25mcg PO q HD  Assessment/Plan:  Syncopal event at HD: Improved with IVF, likely hypotensive in nature. Per records no UF during that HD,  but was hypotensive to start 95/39.  Elevated troponin: Felt not ACS by cardiology  New seizure - AM 12/30: MRI without acute findings, ?unclear etiology.  ESRD: Will follow his outpatient HD unit holiday schedule this week - Mon, Wed, Saturday. Next HD tomorrow.  Hypertension/volume: BP low, amlodipine  has been stopped, will reduce his MTP as well to 25mg  and change to nightly.  Anemia: Hgb 10.6 - good for now.  Metabolic bone disease: Ca, Phos to goal.  T2DM: Not getting insulin , good. Last A1c < 5%  Izetta Boehringer, PA-C 05/23/2024, 3:00 PM  [facility]    Seen and examined independently.  Agree with note and exam as documented above by physician extender and as noted here.  [facility] [place] [place]  Presented from HD after had an unresponsive episode/syncope on HD.  Then this AM early had a witnessed possible seizure with eyes got big, he stiffened up, leaned to the side and started shaking for about a minute per charting.  He has been admitted to obs.  His daughter has arrived to see him - she is a engineer, civil (consulting) and states that he has expressed wanting to be DNR and they are hoping to get that paperwork completed.   General adult male in bed in no acute distress HEENT normocephalic atraumatic extraocular movements intact sclera anicteric Neck supple trachea midline Lungs clear to auscultation bilaterally normal work of breathing at rest on room air Heart S1S2 no rub Abdomen soft nontender nondistended Extremities trace edema  Psych normal mood and affect Neuro alert and oriented x3 provides hx and follows commands  Access RIJ tunn catheter   Syncope -  presented from HD after hypotension/unresponsiveness/syncope on HD - EMS brought to ER  ESRD - on HD MWF  - plan for HD tomorrow  New onset witnessed possible seizure activity - s/p EEG - per primary team  - imaging with unchanged hygroma called   HTN  - stopped amlodipine    Anemia of CKD  - no current ESA  needs - follow  Metabolic bone disease - phos and ca ok   Thank you for the consult.  Please do not hesitate to contact me with any questions regarding our patient     Katheryn [name], MD 05/23/2024  7:22 PM      [1]  Allergies Allergen Reactions   Nsaids Other (See Comments)    Contraindication due to CKD    Sertraline  Other (See Comments)    Hallucinations

## 2024-05-23 NOTE — Procedures (Signed)
 Patient Name: Kelly Dunn  MRN: [id number]  Epilepsy Attending: Arlin MALVA Krebs  Referring Physician/Provider: Charlton Evalene RAMAN, MD  Date: 05/23/2024 Duration: 23.46 mins  Patient history: 78 yo M noted to become stiff and then shake for ~1 minute. He returned to baseline after ~5 minutes. EEG to evaluate for seizure   Level of alertness: Awake, asleep  AEDs during EEG study: None  Technical aspects: This EEG study was done with scalp electrodes positioned according to the 10-20 International system of electrode placement. Electrical activity was reviewed with band pass filter of 1-70Hz , sensitivity of 7 uV/mm, display speed of 55mm/sec with a 60Hz  notched filter applied as appropriate. EEG data were recorded continuously and digitally stored.  Video monitoring was available and reviewed as appropriate.  Description: The posterior dominant rhythm consists of 8-9 Hz activity of moderate voltage (25-35 uV) seen predominantly in posterior head regions, symmetric and reactive to eye opening and eye closing. Sleep was characterized by vertex waves, sleep spindles (12 to 14 Hz), maximal frontocentral region. EEG showed continuous 3 to 6 Hz theta-delta slowing in right fronto-temporal region. Physiologic photic driving was not seen during photic stimulation. Hyperventilation was not performed.     ABNORMALITY - Continuous slow, right fronto-temporal region  IMPRESSION: This study is suggestive of cortical dysfunction arising from right fronto-temporal region likely secondary to underlying structural abnormality. No seizures or epileptiform discharges were seen throughout the recording.  Calton Harshfield O Corrissa Martello

## 2024-05-24 ENCOUNTER — Observation Stay (HOSPITAL_COMMUNITY)

## 2024-05-24 ENCOUNTER — Observation Stay (HOSPITAL_BASED_OUTPATIENT_CLINIC_OR_DEPARTMENT_OTHER)

## 2024-05-24 DIAGNOSIS — N2581 Secondary hyperparathyroidism of renal origin: Secondary | ICD-10-CM | POA: Diagnosis present

## 2024-05-24 DIAGNOSIS — R42 Dizziness and giddiness: Secondary | ICD-10-CM | POA: Diagnosis not present

## 2024-05-24 DIAGNOSIS — Z66 Do not resuscitate: Secondary | ICD-10-CM | POA: Diagnosis present

## 2024-05-24 DIAGNOSIS — I953 Hypotension of hemodialysis: Secondary | ICD-10-CM | POA: Diagnosis present

## 2024-05-24 DIAGNOSIS — I951 Orthostatic hypotension: Secondary | ICD-10-CM | POA: Diagnosis present

## 2024-05-24 DIAGNOSIS — Z95820 Peripheral vascular angioplasty status with implants and grafts: Secondary | ICD-10-CM | POA: Diagnosis not present

## 2024-05-24 DIAGNOSIS — E1122 Type 2 diabetes mellitus with diabetic chronic kidney disease: Secondary | ICD-10-CM | POA: Diagnosis present

## 2024-05-24 DIAGNOSIS — E7800 Pure hypercholesterolemia, unspecified: Secondary | ICD-10-CM | POA: Diagnosis present

## 2024-05-24 DIAGNOSIS — G9608 Other cranial cerebrospinal fluid leak: Secondary | ICD-10-CM | POA: Diagnosis present

## 2024-05-24 DIAGNOSIS — Z7982 Long term (current) use of aspirin: Secondary | ICD-10-CM | POA: Diagnosis not present

## 2024-05-24 DIAGNOSIS — I2489 Other forms of acute ischemic heart disease: Secondary | ICD-10-CM | POA: Diagnosis present

## 2024-05-24 DIAGNOSIS — I6203 Nontraumatic chronic subdural hemorrhage: Secondary | ICD-10-CM | POA: Diagnosis present

## 2024-05-24 DIAGNOSIS — I82409 Acute embolism and thrombosis of unspecified deep veins of unspecified lower extremity: Secondary | ICD-10-CM

## 2024-05-24 DIAGNOSIS — I8290 Acute embolism and thrombosis of unspecified vein: Secondary | ICD-10-CM

## 2024-05-24 DIAGNOSIS — I82C11 Acute embolism and thrombosis of right internal jugular vein: Secondary | ICD-10-CM | POA: Diagnosis present

## 2024-05-24 DIAGNOSIS — R55 Syncope and collapse: Secondary | ICD-10-CM

## 2024-05-24 DIAGNOSIS — Z87891 Personal history of nicotine dependence: Secondary | ICD-10-CM | POA: Diagnosis not present

## 2024-05-24 DIAGNOSIS — Z833 Family history of diabetes mellitus: Secondary | ICD-10-CM | POA: Diagnosis not present

## 2024-05-24 DIAGNOSIS — I5020 Unspecified systolic (congestive) heart failure: Secondary | ICD-10-CM | POA: Diagnosis present

## 2024-05-24 DIAGNOSIS — Z79899 Other long term (current) drug therapy: Secondary | ICD-10-CM | POA: Diagnosis not present

## 2024-05-24 DIAGNOSIS — D631 Anemia in chronic kidney disease: Secondary | ICD-10-CM | POA: Diagnosis present

## 2024-05-24 DIAGNOSIS — N186 End stage renal disease: Secondary | ICD-10-CM | POA: Diagnosis present

## 2024-05-24 DIAGNOSIS — L8990 Pressure ulcer of unspecified site, unspecified stage: Secondary | ICD-10-CM | POA: Insufficient documentation

## 2024-05-24 DIAGNOSIS — I5A Non-ischemic myocardial injury (non-traumatic): Secondary | ICD-10-CM | POA: Diagnosis present

## 2024-05-24 DIAGNOSIS — Z992 Dependence on renal dialysis: Secondary | ICD-10-CM | POA: Diagnosis not present

## 2024-05-24 DIAGNOSIS — R569 Unspecified convulsions: Secondary | ICD-10-CM | POA: Diagnosis present

## 2024-05-24 DIAGNOSIS — Z8249 Family history of ischemic heart disease and other diseases of the circulatory system: Secondary | ICD-10-CM | POA: Diagnosis not present

## 2024-05-24 DIAGNOSIS — E876 Hypokalemia: Secondary | ICD-10-CM | POA: Diagnosis present

## 2024-05-24 DIAGNOSIS — I132 Hypertensive heart and chronic kidney disease with heart failure and with stage 5 chronic kidney disease, or end stage renal disease: Secondary | ICD-10-CM | POA: Diagnosis present

## 2024-05-24 HISTORY — PX: IR REPAIR TUN/NON TUN CV CATH W/O FL: IMG2288

## 2024-05-24 LAB — RENAL FUNCTION PANEL
Albumin: 3.2 g/dL — ABNORMAL LOW (ref 3.5–5.0)
Anion gap: 13 (ref 5–15)
BUN: 47 mg/dL — ABNORMAL HIGH (ref 8–23)
CO2: 26 mmol/L (ref 22–32)
Calcium: 8.9 mg/dL (ref 8.9–10.3)
Chloride: 99 mmol/L (ref 98–111)
Creatinine, Ser: 7.2 mg/dL — ABNORMAL HIGH (ref 0.61–1.24)
GFR, Estimated: 7 mL/min — ABNORMAL LOW
Glucose, Bld: 83 mg/dL (ref 70–99)
Phosphorus: 5.8 mg/dL — ABNORMAL HIGH (ref 2.5–4.6)
Potassium: 4.8 mmol/L (ref 3.5–5.1)
Sodium: 138 mmol/L (ref 135–145)

## 2024-05-24 LAB — CBC WITH DIFFERENTIAL/PLATELET
Abs Immature Granulocytes: 0.02 K/uL (ref 0.00–0.07)
Basophils Absolute: 0 K/uL (ref 0.0–0.1)
Basophils Relative: 0 %
Eosinophils Absolute: 0.4 K/uL (ref 0.0–0.5)
Eosinophils Relative: 6 %
HCT: 30 % — ABNORMAL LOW (ref 39.0–52.0)
Hemoglobin: 9.5 g/dL — ABNORMAL LOW (ref 13.0–17.0)
Immature Granulocytes: 0 %
Lymphocytes Relative: 34 %
Lymphs Abs: 2.1 K/uL (ref 0.7–4.0)
MCH: 32.4 pg (ref 26.0–34.0)
MCHC: 31.7 g/dL (ref 30.0–36.0)
MCV: 102.4 fL — ABNORMAL HIGH (ref 80.0–100.0)
Monocytes Absolute: 0.7 K/uL (ref 0.1–1.0)
Monocytes Relative: 11 %
Neutro Abs: 3 K/uL (ref 1.7–7.7)
Neutrophils Relative %: 49 %
Platelets: 93 K/uL — ABNORMAL LOW (ref 150–400)
RBC: 2.93 MIL/uL — ABNORMAL LOW (ref 4.22–5.81)
RDW: 13.9 % (ref 11.5–15.5)
WBC: 6.1 K/uL (ref 4.0–10.5)
nRBC: 0 % (ref 0.0–0.2)

## 2024-05-24 LAB — PROTIME-INR
INR: 1.1 (ref 0.8–1.2)
Prothrombin Time: 14.7 s (ref 11.4–15.2)

## 2024-05-24 LAB — HEPATITIS B SURFACE ANTIBODY, QUANTITATIVE: Hep B S AB Quant (Post): 4.6 m[IU]/mL — ABNORMAL LOW

## 2024-05-24 LAB — GLUCOSE, CAPILLARY
Glucose-Capillary: 88 mg/dL (ref 70–99)
Glucose-Capillary: 176 mg/dL — ABNORMAL HIGH (ref 70–99)
Glucose-Capillary: 102 mg/dL — ABNORMAL HIGH (ref 70–99)

## 2024-05-24 LAB — MAGNESIUM: Magnesium: 2.3 mg/dL (ref 1.7–2.4)

## 2024-05-24 LAB — APTT: aPTT: 46 s — ABNORMAL HIGH (ref 24–36)

## 2024-05-24 MED ORDER — LIDOCAINE-PRILOCAINE 2.5-2.5 % EX CREA
1.0000 | TOPICAL_CREAM | CUTANEOUS | Status: DC | PRN
  Filled 2024-05-24: qty 5

## 2024-05-24 MED ORDER — MIDODRINE HCL 5 MG PO TABS
5.0000 mg | ORAL_TABLET | ORAL | Status: DC
  Administered 2024-05-24: 5 mg via ORAL
  Filled 2024-05-24: qty 1

## 2024-05-24 MED ORDER — HEPARIN SODIUM (PORCINE) 1000 UNIT/ML DIALYSIS
2500.0000 [IU] | Freq: Once | INTRAMUSCULAR | Status: AC
  Administered 2024-05-24: 2500 [IU] via INTRAVENOUS_CENTRAL
  Filled 2024-05-24: qty 3

## 2024-05-24 MED ORDER — HEPARIN BOLUS VIA INFUSION
2500.0000 [IU] | Freq: Once | INTRAVENOUS | Status: DC
  Filled 2024-05-24: qty 2500

## 2024-05-24 MED ORDER — PENTAFLUOROPROP-TETRAFLUOROETH EX AERO: 1.0000 | INHALATION_SPRAY | CUTANEOUS | Status: DC | PRN

## 2024-05-24 MED ORDER — ALTEPLASE 2 MG IJ SOLR
2.0000 mg | Freq: Once | INTRAMUSCULAR | Status: DC | PRN
  Filled 2024-05-24: qty 2

## 2024-05-24 MED ORDER — LIDOCAINE HCL (PF) 1 % IJ SOLN
5.0000 mL | INTRAMUSCULAR | Status: DC | PRN
  Filled 2024-05-24: qty 5

## 2024-05-24 MED ORDER — HEPARIN SODIUM (PORCINE) 1000 UNIT/ML IJ SOLN
INTRAMUSCULAR | Status: AC
  Filled 2024-05-24: qty 6

## 2024-05-24 MED ORDER — HEPARIN SODIUM (PORCINE) 1000 UNIT/ML IJ SOLN
INTRAMUSCULAR | Status: AC
  Filled 2024-05-24: qty 2

## 2024-05-24 MED ORDER — HEPARIN SODIUM (PORCINE) 1000 UNIT/ML DIALYSIS
1000.0000 [IU] | INTRAMUSCULAR | Status: DC | PRN
  Administered 2024-05-24: 1000 [IU]
  Filled 2024-05-24 (×2): qty 1

## 2024-05-24 MED ORDER — SODIUM CHLORIDE 0.9 % IV BOLUS
250.0000 mL | Freq: Once | INTRAVENOUS | Status: AC
  Administered 2024-05-24: 250 mL via INTRAVENOUS

## 2024-05-24 MED ORDER — HEPARIN (PORCINE) 25000 UT/250ML-% IV SOLN
1050.0000 [IU]/h | INTRAVENOUS | Status: AC
  Administered 2024-05-24: 1400 [IU]/h via INTRAVENOUS
  Administered 2024-05-26: 1050 [IU]/h via INTRAVENOUS
  Filled 2024-05-24 (×4): qty 250

## 2024-05-24 MED ORDER — ANTICOAGULANT SODIUM CITRATE 4% (200MG/5ML) IV SOLN
5.0000 mL | Status: DC | PRN
  Filled 2024-05-24: qty 5

## 2024-05-24 NOTE — Care Management Obs Status (Signed)
 MEDICARE OBSERVATION STATUS NOTIFICATION   Patient Details  Name: Lavonta Tillis MRN: [id number] Date of Birth: [birth year]   Medicare Observation Status Notification Given:       Vonzell Arrie Sharps 05/24/2024, 9:17 AM

## 2024-05-24 NOTE — Progress Notes (Addendum)
 " Kelly Dunn   Subjective:   Seen in room - feels ok. Denies dizziness, CP, dyspnea. S/p EEG showing cortical dysfunction arising from right fronto-temporal region likely secondary to underlying structural abnormality. No seizures or epileptiform discharges were seen throughout the recording. Plan is for HD today.    Objective Vitals:   05/23/24 2008 05/24/24 0016 05/24/24 0436 05/24/24 0845  BP: (!) 116/43 (!) 95/52 (!) 124/44 (!) 119/51  Pulse: 77 70 65 80  Resp: 19 18 17 16   Temp: 98.5 F (36.9 C) 98.5 F (36.9 C) 98.6 F (37 C) 98.2 F (36.8 C)  TempSrc: Oral Oral Oral Oral  SpO2: 96% 97% 95% 93%  Weight:   92 kg   Height:       Physical Exam General: Well appearing, NAD. Room air  Heart: RRR Lungs: CTA anteriorly/laterally Abdomen: soft Extremities: no LE edema Dialysis Access: TDC in chest, LUE AVF +t/b with bruising  Additional Objective Labs: Basic Metabolic Panel: Recent Labs  Lab 05/22/24 1347 05/22/24 1353 05/22/24 1737 05/23/24 0405 05/24/24 0604  NA 139 138  --  139 138  K 3.4* 3.2*  --  4.2 4.8  CL 97*  --   --  100 99  CO2 30  --   --  28 26  GLUCOSE 168*  --   --  144* 83  BUN 19  --   --  27* 47*  CREATININE 3.91*  --   --  4.99* 7.20*  CALCIUM  8.6*  --   --  9.0 8.9  PHOS  --   --  3.9  --  5.8*   Liver Function Tests: Recent Labs  Lab 05/22/24 1347 05/24/24 0604  AST 21  --   ALT 18  --   ALKPHOS 100  --   BILITOT 0.6  --   PROT 6.8  --   ALBUMIN 3.9 3.2*   CBC: Recent Labs  Lab 05/22/24 1347 05/22/24 1353 05/23/24 0405 05/24/24 0604  WBC 4.6  --  6.3 6.1  NEUTROABS 3.9  --   --  3.0  HGB 10.8* 11.9* 10.6* 9.5*  HCT 33.7* 35.0* 34.0* 30.0*  MCV 100.9*  --  101.5* 102.4*  PLT 101*  --  108* 93*   Studies/Results: ECHOCARDIOGRAM COMPLETE Result Date: 05/23/2024    ECHOCARDIOGRAM REPORT   Patient Name:   Kelly Dunn Date of Exam: 05/23/2024 Medical Rec #:  [id number]          Height:        72.0 in Accession #:    [id number]         Weight:       218.7 lb Date of Birth:  May 11, 1946          BSA:          2.213 m Patient Age:    78 years           BP:           93/43 mmHg Patient Gender: M                  HR:           64 bpm. Exam Location:  Inpatient Procedure: 2D Echo, Cardiac Doppler, Color Doppler and Intracardiac            Opacification Agent (Both Spectral and Color Flow Doppler were            utilized during procedure). Indications:  Syncope R55  History:        Patient has no prior history of Echocardiogram examinations.                 Risk Factors:Hypertension, Diabetes and Former Smoker.  Sonographer:    Merlynn Argyle Referring Phys: [id number] MARK C SKAINS  Sonographer Comments: Suboptimal subcostal window. Image acquisition challenging due to respiratory motion. IMPRESSIONS  1. Left ventricular ejection fraction, by estimation, is 45 to 50%. Left ventricular ejection fraction by 2D MOD biplane is 47.5 %. The left ventricle has mildly decreased function. The left ventricle demonstrates global hypokinesis. The left ventricular internal cavity size was mildly dilated. There is moderate asymmetric left ventricular hypertrophy of the basal-septal segment. Left ventricular diastolic parameters are consistent with Grade I diastolic dysfunction (impaired relaxation).  2. Right ventricular systolic function is low normal. The right ventricular size is normal. Tricuspid regurgitation signal is inadequate for assessing PA pressure.  3. Left atrial size was mildly dilated.  4. Right atrial size was mildly dilated.  5. The mitral valve is normal in structure. No evidence of mitral valve regurgitation. No evidence of mitral stenosis.  6. The aortic valve is tricuspid. Aortic valve regurgitation is trivial. No aortic stenosis is present.  7. Aortic dilatation noted. There is moderate dilatation of the aortic root, measuring 46 mm. There is borderline dilatation of the ascending aorta, measuring 39 mm.   8. The inferior vena cava is normal in size with greater than 50% respiratory variability, suggesting right atrial pressure of 3 mmHg.  9. A small pericardial effusion is present. FINDINGS  Left Ventricle: Left ventricular ejection fraction, by estimation, is 45 to 50%. Left ventricular ejection fraction by 2D MOD biplane is 47.5 %. The left ventricle has mildly decreased function. The left ventricle demonstrates global hypokinesis. Definity  contrast agent was given IV to delineate the left ventricular endocardial borders. The left ventricular internal cavity size was mildly dilated. There is moderate asymmetric left ventricular hypertrophy of the basal-septal segment. Left ventricular diastolic parameters are consistent with Grade I diastolic dysfunction (impaired relaxation). Right Ventricle: The right ventricular size is normal. No increase in right ventricular wall thickness. Right ventricular systolic function is low normal. Tricuspid regurgitation signal is inadequate for assessing PA pressure. Left Atrium: Left atrial size was mildly dilated. Right Atrium: Right atrial size was mildly dilated. Pericardium: A small pericardial effusion is present. Mitral Valve: The mitral valve is normal in structure. No evidence of mitral valve regurgitation. No evidence of mitral valve stenosis. Tricuspid Valve: The tricuspid valve is normal in structure. Tricuspid valve regurgitation is trivial. Aortic Valve: The aortic valve is tricuspid. Aortic valve regurgitation is trivial. Aortic regurgitation PHT measures 666 msec. No aortic stenosis is present. Pulmonic Valve: The pulmonic valve was normal in structure. Pulmonic valve regurgitation is not visualized. Aorta: Aortic dilatation noted. There is moderate dilatation of the aortic root, measuring 46 mm. There is borderline dilatation of the ascending aorta, measuring 39 mm. Venous: The inferior vena cava is normal in size with greater than 50% respiratory variability,  suggesting right atrial pressure of 3 mmHg. IAS/Shunts: No atrial level shunt detected by color flow Doppler.  LEFT VENTRICLE PLAX 2D                        Biplane EF (MOD) LVIDd:         6.30 cm         LV Biplane EF:   Left LVIDs:  4.50 cm                          ventricular LV PW:         0.90 cm                          ejection LV IVS:        0.70 cm                          fraction by LVOT diam:     2.60 cm                          2D MOD LV SV:         113                              biplane is LV SV Index:   51                               47.5 %. LVOT Area:     5.31 cm                                Diastology                                LV e' medial:    5.44 cm/s LV Volumes (MOD)               LV E/e' medial:  9.0 LV vol d, MOD    137.5 ml      LV e' lateral:   10.10 cm/s A2C:                           LV E/e' lateral: 4.8 LV vol d, MOD    173.0 ml A4C: LV vol s, MOD    66.4 ml A2C: LV vol s, MOD    102.0 ml A4C: LV SV MOD A2C:   71.1 ml LV SV MOD A4C:   173.0 ml LV SV MOD BP:    73.6 ml RIGHT VENTRICLE RV Basal diam:  3.70 cm RV S prime:     10.70 cm/s TAPSE (M-mode): 1.6 cm LEFT ATRIUM             Index        RIGHT ATRIUM           Index LA diam:        4.00 cm 1.81 cm/m   RA Area:     19.20 cm LA Vol (A2C):   59.8 ml 27.02 ml/m  RA Volume:   55.30 ml  24.99 ml/m LA Vol (A4C):   69.0 ml 31.18 ml/m LA Biplane Vol: 64.8 ml 29.28 ml/m  AORTIC VALVE LVOT Vmax:   92.50 cm/s LVOT Vmean:  67.600 cm/s LVOT VTI:    0.213 m AI PHT:      666 msec  AORTA Ao Root diam: 4.60 cm Ao Asc diam:  3.90 cm MITRAL VALVE MV Area (PHT): 2.02 cm    SHUNTS MV Decel Time: 375 msec  Systemic VTI:  0.21 m MV E velocity: 48.70 cm/s  Systemic Diam: 2.60 cm MV A velocity: 91.90 cm/s MV E/A ratio:  0.53 Dalton McleanMD Electronically signed by Ezra Kanner Signature Date/Time: 05/23/2024/5:01:41 PM    Final    EEG adult Result Date: 05/23/2024 Shelton Arlin KIDD, MD     05/23/2024 12:19 PM Patient Name:  Kelly Dunn MRN: [id number] Epilepsy Attending: Arlin KIDD Shelton Referring Physician/Provider: Charlton Evalene RAMAN, MD Date: 05/23/2024 Duration: 23.46 mins Patient history: 78 yo M noted to become stiff and then shake for ~1 minute. He returned to baseline after ~5 minutes. EEG to evaluate for seizure Level of alertness: Awake, asleep AEDs during EEG study: None Technical aspects: This EEG study was done with scalp electrodes positioned according to the 10-20 International system of electrode placement. Electrical activity was reviewed with band pass filter of 1-70Hz , sensitivity of 7 uV/mm, display speed of 58mm/sec with a 60Hz  notched filter applied as appropriate. EEG data were recorded continuously and digitally stored.  Video monitoring was available and reviewed as appropriate. Description: The posterior dominant rhythm consists of 8-9 Hz activity of moderate voltage (25-35 uV) seen predominantly in posterior head regions, symmetric and reactive to eye opening and eye closing. Sleep was characterized by vertex waves, sleep spindles (12 to 14 Hz), maximal frontocentral region. EEG showed continuous 3 to 6 Hz theta-delta slowing in right fronto-temporal region. Physiologic photic driving was not seen during photic stimulation. Hyperventilation was not performed.   ABNORMALITY - Continuous slow, right fronto-temporal region IMPRESSION: This study is suggestive of cortical dysfunction arising from right fronto-temporal region likely secondary to underlying structural abnormality. No seizures or epileptiform discharges were seen throughout the recording. Arlin KIDD Shelton   MR BRAIN WO CONTRAST Result Date: 05/23/2024 EXAM: MRI BRAIN WITHOUT CONTRAST 05/23/2024 03:53:00 AM TECHNIQUE: Multiplanar multisequence MRI of the head/brain was performed without the administration of intravenous contrast. COMPARISON: CT of the head dated 05/22/2024. CLINICAL HISTORY: Seizure, new-onset, no history of trauma. FINDINGS:  BRAIN AND VENTRICLES: No acute infarct. No intracranial hemorrhage. No mass. No midline shift. No hydrocephalus. There is age-related cerebral volume loss and mild-to-moderate diffuse cerebral white matter disease. A prominent extraaxial fluid collection is again seen overlying the right frontal lobe, which is isointense with CSF and has no focal mass effect upon the underlying brain parenchyma. The sella is unremarkable. Normal flow voids. ORBITS: No acute abnormality. SINUSES AND MASTOIDS: No acute abnormality. BONES AND SOFT TISSUES: Normal marrow signal. No acute soft tissue abnormality. IMPRESSION: 1. No acute intracranial abnormality to account for the new-onset seizure. 2. Age-related cerebral volume loss and mild-to-moderate diffuse cerebral white matter disease. 3. Prominent extra-axial CSF-intensity fluid collection overlying the right frontal lobe, without focal mass effect, similar to CT head 05/22/2024. Electronically signed by: Evalene Coho MD 05/23/2024 04:00 AM EST RP Workstation: [id number]   DG Chest Portable 1 View Result Date: 05/22/2024 EXAM: 1 VIEW(S) XRAY OF THE CHEST 05/22/2024 01:50:00 PM COMPARISON: 05/04/2024 CLINICAL HISTORY: chest pain FINDINGS: LINES, TUBES AND DEVICES: Right chest dialysis catheter in place. Left subclavian stent noted. LUNGS AND PLEURA: Diffusely increased interstitial prominence. No focal pulmonary airspace opacity. No pleural effusion. No pneumothorax. HEART AND MEDIASTINUM: Calcified aorta. BONES AND SOFT TISSUES: No acute osseous abnormality. IMPRESSION: 1. Increased interstitial prominence concerning interstitial edema. No significant pleural effusion or consolidative change. Electronically signed by: Waddell Calk MD 05/22/2024 02:50 PM EST RP Workstation: [id number]   CT Head Wo Contrast Result Date: 05/22/2024 EXAM: CT HEAD WITHOUT CONTRAST  05/22/2024 02:01:07 PM TECHNIQUE: CT of the head was performed without the administration of intravenous  contrast. Automated exposure control, iterative reconstruction, and/or weight based adjustment of the mA/kV was utilized to reduce the radiation dose to as low as reasonably achievable. COMPARISON: head CT 04/28/2024 CLINICAL HISTORY: Mental status change, unknown cause FINDINGS: BRAIN AND VENTRICLES: Asymmetric low density extra-axial fluid over the right frontal convexity is unchanged, measuring up to 8 mm in thickness with associated slight mass effect on the frontal lobe and compatible with a subdural hygroma or chronic hematoma. No significant midline shift, acute intracranial hemorrhage, acute infarct, hydrocephalus, or mass is identified. There is mild cerebral atrophy. Cerebral white matter hypodensities are unchanged and nonspecific but compatible with mild chronic small vessel ischemic disease. Calcified atherosclerosis at the skull base. ORBITS: No acute abnormality. SINUSES: Minimal mucosal thickening in the paranasal sinuses. Clear mastoid air cells. SOFT TISSUES AND SKULL: No acute soft tissue abnormality. No skull fracture. IMPRESSION: 1. No acute intracranial abnormality. 2. Unchanged small right frontal subdural hygroma or chronic subdural hematoma. 3. Mild chronic small vessel ischemic disease. Electronically signed by: Dasie Hamburg MD 05/22/2024 02:23 PM EST RP Workstation: [id number]   Medications:   allopurinol   100 mg Oral Daily   aspirin  EC  81 mg Oral Daily   atorvastatin   40 mg Oral Daily   calcitRIOL   0.25 mcg Oral Once per day on Monday Wednesday Friday   Chlorhexidine  Gluconate Cloth  6 each Topical Q0600   cholecalciferol   5,000 Units Oral Daily   ferrous sulfate   325 mg Oral Daily   heparin   5,000 Units Subcutaneous Q8H   insulin  aspart  0-6 Units Subcutaneous TID WC   melatonin  5 mg Oral QHS   multivitamin with minerals  1 tablet Oral Daily   senna-docusate  1 tablet Oral QHS   sodium chloride  flush  3 mL Intravenous Q12H    Dialysis Orders TTS - [facility] (1st HD 12/6  at [facility], only 3 outpatient HD so far) 4hr, 400/600, EDW 91kg, 2K/2Ca, TDC + AVF, heparin  2500 unit bolus - Getting course of Venofer - Calcitriol  0.25mcg PO q HD   Assessment/Plan:  Syncopal event at HD: Improved with IVF, likely hypotensive in nature.   Elevated troponin: Felt not ACS by cardiology  New seizure-like activity - AM 12/30: MRI without acute findings, EEG without seizure.   ESRD: Will follow his outpatient HD unit holiday schedule this week - Mon, Wed, Saturday. For HD today.  Hypertension/volume: Amlodipine /MTP stopped. BP better today. May need mido q HD - follow.  Anemia: Hgb 9.5 - will start ESA as outpatient.  Metabolic bone disease: Ca ok, Phos slightly high - will start binders as outpatient.  T2DM: Not getting insulin , good. Last A1c < 5%  HFrEF   Addendum: Seen at start of HD. BP soft, add mido 5mg  pre-HD. [place]  Izetta Boehringer, PA-C 05/24/2024, 10:27 AM  [facility]   Seen and examined independently.  Agree with Dunn and exam as documented above by physician extender and as noted here.  He feels ok now.  Denies any interim seizure-like or syncopal events that he recalls but he does state he felt dizzy when h stood up this morning.    General adult male in bed in no acute distress HEENT normocephalic atraumatic extraocular movements intact sclera anicteric Neck supple trachea midline Lungs clear to auscultation bilaterally normal work of breathing at rest on room air Heart S1S2 no rub Abdomen soft nontender nondistended Extremities  trace edema  Psych normal mood and affect Neuro alert and oriented x3 provides hx and follows commands  Access RIJ tunn catheter     Syncope - presented from HD after hypotension/unresponsiveness/syncope on HD - dizziness on standing today - Minibolus 250 mL NS once now - has orders for pre-HD midodrine today - hope to be able to stop and just hold his BP meds   ESRD - on HD MWF schedule - HD today and we've  asked that he be done on day shift given the recent syncope on HD   New onset witnessed possible seizure activity - s/p EEG - per primary team  - imaging with unchanged hygroma called    HTN  - hypotension here - stopped amlodipine   - giving NS minibolus once now  - no UF with HD today   Anemia of CKD  - no current ESA needs - follow  Disposition per primary team   Katheryn [name], MD 05/24/2024  11:47 AM   "

## 2024-05-24 NOTE — Procedures (Signed)
 HD Note:  Some information was entered later than the data was gathered due to patient care needs. The stated time with the data is accurate.  Received patient in bed to unit.   Alert and oriented.   Informed consent signed and in chart.   Access used: Upper right chest HD tunneled catheter. Access issues:  Patient's arterial lumen had a crack that let air in and NS flow out when pushed.  Izetta Boehringer PA was notified. New orders were given.  IR came to the bedside, see their note.  Catheter was repaired.  Lines did need to be reversed for treatment.  Arterial line pressure  were above acceptable limits.   Hand-off given to oncoming dialysis nurse   Berwyn L. Lenon, RN [facility].

## 2024-05-24 NOTE — Progress Notes (Signed)
 " Progress Note    Kelly Dunn   FMW:[id number]  DOB: [birth year]  DOA: 05/22/2024     0 PCP: Regino Slater, MD  Initial CC: unresponsive   Hospital Course: Kelly Dunn is a 78 year old male with a significant history of ESRD (initiated HD 04/29/2024), Type 1 Diabetes, HTN, HLD, gout, and neuropathy. He was recently hospitalized (04/28/24) for uremic encephalopathy, which prompted the start of chronic hemodialysis.  During dialysis prior to admission he became transiently altered and briefly hypoxic.  Dialysis was aborted and he received small fluid bolus with return to baseline mentation.  Interval History:   Became hypotensive again upon standing with PT this morning and dizzy as well. PermCath also had crack and repaired by radiology. Carotid duplex also showing right IJ DVT.   Assessment and Plan: * Postural dizziness with presyncope - Brief altered mental status during hemodialysis - suspect component of convulsive syncope as etiology of admission - still orthostatic 12/30 when checked also - started on midodrine with HD on 12/31 due to ongoing dizziness with transfers and hypotension in general  Altered mental status-resolved as of 05/24/2024 - Strongly suspect hypotension contributed based off now seeing all the low BP issues since admission too  - Orthostatics very positive when checked on 12/30 also - now back to normal mentation  DVT (deep venous thrombosis) (HCC) - Carotid ultrasound ordered on admission for syncope workup; found to have acute right IJ DVT.  Concurrently has right PermCath, likely precipitating cause - discussed with nephrology as well; will attempt left arm fistula s/p fix on 12/8 with VVS - if able to remove perm-cath then ideal and would aim for 3 month course of anticoagulation  - start heparin  drip for now and monitor tolerance; then can transition to DOAC   ESRD (end stage renal disease) (HCC) - Nephrology following - Dialysis per  nephrology -BP medications have been modified  Hypokalemia - replete as needed   Diabetic neuropathy (HCC) - Gabapentin held last admission due to confusion and encephalopathy  History of gout - On allopurinol   HLD (hyperlipidemia) - on statin  Elevated troponin Likely ischemic demand; combo of hypotension and HD - Appreciate cardiology evaluation as well -Echo obtained, EF 45 to 50%, global hypokinesis, grade 1 DD  DM (diabetes mellitus), type 2 (HCC) - A1c 4.3 on 04/28/24 -On last admission long-acting insulin  Lantus  has been discontinued -Continue checking CBG q. ACHS, SSI coverage    Antimicrobials:   DVT prophylaxis:  heparin  injection 5,000 Units Start: 05/22/24 1545 SCDs Start: 05/22/24 1533   Code Status:   Code Status: Full Code  Mobility Assessment (Last 72 Hours)     Mobility Assessment     Row Name 05/24/24 1100 05/23/24 2000 05/23/24 1300       Does the patient have exclusion criteria? -- No- Perform mobility assessment No- Perform mobility assessment     What is the highest level of mobility based on the mobility assessment? Level 3 (Stands with assistance) - Balance while standing  and cannot march in place Level 3 (Stands with assistance) - Balance while standing  and cannot march in place Level 3 (Stands with assistance) - Balance while standing  and cannot march in place     Is the above level different from baseline mobility prior to current illness? -- Yes - Recommend PT order --        Diet: Diet Orders (From admission, onward)     Start     Ordered   05/24/24  1550  Diet renal with fluid restriction Fluid restriction: 1200 mL Fluid; Room service appropriate? Yes; Fluid consistency: Thin  Diet effective now       Question Answer Comment  Fluid restriction: 1200 mL Fluid   Room service appropriate? Yes   Fluid consistency: Thin      05/24/24 1550            Barriers to discharge: None Disposition Plan: Home HH orders placed:  TBD Status is: Observation  Objective: Blood pressure (!) 118/43, pulse 69, temperature 97.7 F (36.5 C), temperature source Oral, resp. rate 15, height 5' 9 (1.753 m), weight 93.2 kg, SpO2 99%.  Examination:  Physical Exam Constitutional:      General: He is not in acute distress.    Appearance: Normal appearance.  HENT:     Head: Normocephalic and atraumatic.     Mouth/Throat:     Mouth: Mucous membranes are moist.  Eyes:     Extraocular Movements: Extraocular movements intact.  Cardiovascular:     Rate and Rhythm: Normal rate and regular rhythm.  Pulmonary:     Effort: Pulmonary effort is normal. No respiratory distress.     Breath sounds: Normal breath sounds. No wheezing.  Abdominal:     General: Bowel sounds are normal. There is no distension.     Palpations: Abdomen is soft.     Tenderness: There is no abdominal tenderness.  Musculoskeletal:        General: Normal range of motion.     Cervical back: Normal range of motion and neck supple.  Skin:    General: Skin is warm and dry.  Neurological:     General: No focal deficit present.     Mental Status: He is alert.  Psychiatric:        Mood and Affect: Mood normal.        Behavior: Behavior normal.      Consultants:  Cardiology Nephrology  Procedures:    Data Reviewed: Results for orders placed or performed during the hospital encounter of 05/22/24 (from the past 24 hours)  CBC with Differential/Platelet     Status: Abnormal   Collection Time: 05/24/24  6:04 AM  Result Value Ref Range   WBC 6.1 4.0 - 10.5 K/uL   RBC 2.93 (L) 4.22 - 5.81 MIL/uL   Hemoglobin 9.5 (L) 13.0 - 17.0 g/dL   HCT 69.9 (L) 60.9 - 47.9 %   MCV 102.4 (H) 80.0 - 100.0 fL   MCH 32.4 26.0 - 34.0 pg   MCHC 31.7 30.0 - 36.0 g/dL   RDW 86.0 88.4 - 84.4 %   Platelets 93 (L) 150 - 400 K/uL   nRBC 0.0 0.0 - 0.2 %   Neutrophils Relative % 49 %   Neutro Abs 3.0 1.7 - 7.7 K/uL   Lymphocytes Relative 34 %   Lymphs Abs 2.1 0.7 - 4.0 K/uL    Monocytes Relative 11 %   Monocytes Absolute 0.7 0.1 - 1.0 K/uL   Eosinophils Relative 6 %   Eosinophils Absolute 0.4 0.0 - 0.5 K/uL   Basophils Relative 0 %   Basophils Absolute 0.0 0.0 - 0.1 K/uL   Immature Granulocytes 0 %   Abs Immature Granulocytes 0.02 0.00 - 0.07 K/uL  Renal function panel     Status: Abnormal   Collection Time: 05/24/24  6:04 AM  Result Value Ref Range   Sodium 138 135 - 145 mmol/L   Potassium 4.8 3.5 - 5.1 mmol/L   Chloride  99 98 - 111 mmol/L   CO2 26 22 - 32 mmol/L   Glucose, Bld 83 70 - 99 mg/dL   BUN 47 (H) 8 - 23 mg/dL   Creatinine, Ser 2.79 (H) 0.61 - 1.24 mg/dL   Calcium  8.9 8.9 - 10.3 mg/dL   Phosphorus 5.8 (H) 2.5 - 4.6 mg/dL   Albumin 3.2 (L) 3.5 - 5.0 g/dL   GFR, Estimated 7 (L) >60 mL/min   Anion gap 13 5 - 15  Magnesium     Status: None   Collection Time: 05/24/24  6:04 AM  Result Value Ref Range   Magnesium 2.3 1.7 - 2.4 mg/dL  Glucose, capillary     Status: None   Collection Time: 05/24/24  8:47 AM  Result Value Ref Range   Glucose-Capillary 88 70 - 99 mg/dL  Glucose, capillary     Status: Abnormal   Collection Time: 05/24/24 12:58 PM  Result Value Ref Range   Glucose-Capillary 176 (H) 70 - 99 mg/dL    I have reviewed pertinent nursing notes, vitals, labs, and images as necessary. I have ordered labwork to follow up on as indicated.  I have reviewed the last notes from staff over past 24 hours. I have discussed patient's care plan and test results with nursing staff, CM/SW, and other staff as appropriate.  Old records reviewed in assessment of this patient  Time spent: Greater than 50% of the 55 minute visit was spent in counseling/coordination of care for the patient as laid out in the A&P.   LOS: 0 days   Alm Apo, MD [facility] 05/24/2024, 4:46 PM "

## 2024-05-24 NOTE — Progress Notes (Signed)
 PHARMACY - ANTICOAGULATION CONSULT NOTE  Pharmacy Consult for Heparin  Indication: RIJ DVT  Allergies[1]  Patient Measurements: Height: 5' 9 (175.3 cm) Weight: 93.2 kg (205 lb 7.5 oz) IBW/kg (Calculated) : 70.7 HEPARIN  DW (KG): 89.6  Vital Signs: Temp: 97.7 F (36.5 C) (12/31 1448) Temp Source: Oral (12/31 1448) BP: 106/50 (12/31 1650) Pulse Rate: 66 (12/31 1650)  Labs: Recent Labs    05/22/24 1347 05/22/24 1353 05/23/24 0405 05/24/24 0604  HGB 10.8* 11.9* 10.6* 9.5*  HCT 33.7* 35.0* 34.0* 30.0*  PLT 101*  --  108* 93*  CREATININE 3.91*  --  4.99* 7.20*    Estimated Creatinine Clearance: 9.8 mL/min (A) (by C-G formula based on SCr of 7.2 mg/dL (H)).   Medical History: Past Medical History:  Diagnosis Date   Chronic kidney disease (CKD), stage III (moderate) (HCC)    Diabetes mellitus    type II   Dyspnea    with activity   Gout    Hypercholesteremia    Hypertension    Neuropathy associated with endocrine disorder     Medications:  Scheduled:   allopurinol   100 mg Oral Daily   aspirin  EC  81 mg Oral Daily   atorvastatin   40 mg Oral Daily   calcitRIOL   0.25 mcg Oral Once per day on Monday Wednesday Friday   Chlorhexidine  Gluconate Cloth  6 each Topical Q0600   cholecalciferol   5,000 Units Oral Daily   ferrous sulfate   325 mg Oral Daily   heparin   5,000 Units Subcutaneous Q8H   insulin  aspart  0-6 Units Subcutaneous TID WC   melatonin  5 mg Oral QHS   midodrine  5 mg Oral Q M,W,F-HD   multivitamin with minerals  1 tablet Oral Daily   senna-docusate  1 tablet Oral QHS   sodium chloride  flush  3 mL Intravenous Q12H   Infusions:   anticoagulant sodium citrate      PRN: acetaminophen  **OR** acetaminophen , alteplase , anticoagulant sodium citrate , heparin , hydrALAZINE , HYDROmorphone  (DILAUDID ) injection, ipratropium, lidocaine  (PF), lidocaine -prilocaine , ondansetron  **OR** ondansetron  (ZOFRAN ) IV, mouth rinse, oxyCODONE , pentafluoroprop-tetrafluoroeth,  traZODone   Assessment: 28 male admitted with dizziness and syncope found to have an acute right internal jugular DVT. Pharmacy consulted to dose IV heparin . Patient has been receiving SQ heparin , last dose at 05:15 and received 2500 units in HD catheter at 16:03. Not on anticoagulation prior to admit.  Hgb 9.5, Plts 93  Goal of Therapy:  Heparin  level 0.3-0.7 units/ml Monitor platelets by anticoagulation protocol: Yes   Plan:  Give 2500 units bolus x 1 Start heparin  infusion at 1400 units/hr Check anti-Xa level in 8 hours and daily while on heparin  Continue to monitor H&H and platelets  Rocky Slade, PharmD, BCPS 05/24/2024,4:56 PM  Please check AMION for all [facility] Pharmacy phone numbers After 10:00 PM, call Main Pharmacy [phone]       [1]  Allergies Allergen Reactions   Nsaids Other (See Comments)    Contraindication due to CKD    Sertraline  Other (See Comments)    Hallucinations

## 2024-05-24 NOTE — Progress Notes (Addendum)
 Paged by dialysis RN that when she tried to pull heparin  from arterial lumen of TDC it was pulling air and then she tried to push saline and it shot out the side - tubing appears cracked.  Will consult IR for replacement or repair.  Will postpone dialysis until after this is fixed.  Izetta Boehringer, PA-C [facility] Pager [phone]   ADDENDUM: TDC repaired by IR, HD going now without issues.  Received message from hospitalist - now has acute R internal jugular DVT associated with TDC. Starting heparin  -> Eliquis. Probably need to attempt cannulation on AVF for next dialysis to see about having TDC removed. Dr. Jerrye aware/agrees. KS

## 2024-05-24 NOTE — TOC Progression Note (Addendum)
 Transition of Care [facility]) - Progression Note    Patient Details  Name: Kelly Dunn MRN: [id number] Date of Birth: 1946/04/05  Transition of Care [facility]) CM/SW Contact  Isaiah Public, LCSWA Phone Number: 05/24/2024, 11:09 AM  Clinical Narrative:     Laneta CMA informed CSW that patients insurance authorization is currently pending for SNF. CSW spoke with Kia with [facility] who confirmed facility can offer bed for patient. CSW LVM for patients daughter Kelly Dunn. CSW awaiting call back to provide SNF bed offers.  Update- CSW spoke with patients daughter Kelly Dunn and informed her the [facility] can accept patient back for rehab when medically ready for dc. Kelly Dunn accepted SNF bed offer with [facility]. CSW informed Laneta CMA of patients facility choice to add to patients insurance authorization request. CSW will continue to follow.  CSW provided patients HD information to Kia in admissions with [facility]. out-pt HD at [facility] on TTS 10:40 am chair time.   Expected Discharge Plan: Skilled Nursing Facility Barriers to Discharge: Continued Medical Work up               Expected Discharge Plan and Services In-house Referral: Clinical Social Work     Living arrangements for the past 2 months:  (from home alone, recently at [facility])                                       Social Drivers of Health (SDOH) Interventions SDOH Screenings   Food Insecurity: No Food Insecurity (05/23/2024)  Housing: Low Risk (05/23/2024)  Transportation Needs: No Transportation Needs (05/23/2024)  Utilities: Not At Risk (05/23/2024)  Social Connections: Socially Isolated (05/23/2024)  Tobacco Use: Medium Risk (05/22/2024)    Readmission Risk Interventions     No data to display

## 2024-05-24 NOTE — Progress Notes (Signed)
 TDC placed by surgery on 12.11.25. Team called IR stating that there was a crack in the arterial port. TDC repaired at bedside. Okay to use.

## 2024-05-24 NOTE — Progress Notes (Signed)
 "  Progress Note  Patient Name: Kelly Dunn Date of Encounter: 05/24/2024 [facility] Cardiologist: None   Interval Summary   Orthostatic vital signs markedly positive with 66 systolic upon standing.  MRI and EEG of brain unremarkable  Yesterday stopped amlodipine  and stopped Coreg  and changed to metoprolol .   Vital Signs Vitals:   05/23/24 1636 05/23/24 2008 05/24/24 0016 05/24/24 0436  BP: (!) 100/47 (!) 116/43 (!) 95/52 (!) 124/44  Pulse:  77 70 65  Resp: 16 19 18 17   Temp: 98.3 F (36.8 C) 98.5 F (36.9 C) 98.5 F (36.9 C) 98.6 F (37 C)  TempSrc: Axillary Oral Oral Oral  SpO2:  96% 97% 95%  Weight:    92 kg  Height:        Intake/Output Summary (Last 24 hours) at 05/24/2024 9380 Last data filed at 05/23/2024 2112 Gross per 24 hour  Intake 121 ml  Output --  Net 121 ml      05/24/2024    4:36 AM 05/23/2024   12:48 PM 05/23/2024    1:24 AM  Last 3 Weights  Weight (lbs) 202 lb 14.4 oz 203 lb 7.7 oz 218 lb 11.1 oz  Weight (kg) 92.035 kg 92.298 kg 99.2 kg      Telemetry/ECG  Sinus rhythm, no adverse arrhythmias detected- Personally Reviewed  Physical Exam  GEN: No acute distress.  Pleasant Neck: No JVD Cardiac: RRR, no murmurs, rubs, or gallops.  Respiratory: Clear to auscultation bilaterally.,  Right subclavian dialysis catheter GI: Soft, nontender, non-distended  MS: No edema  Assessment & Plan   78 year old male with new start dialysis fairly recently secondary to end-stage renal disease with recent hospitalization for encephalopathic symptoms who was noted to have transient loss of consciousness at dialysis center but seemed to respond to fluid bolus.  We were called because of potential chest pain and elevated troponin.  Elevated troponin - Troponin initially was 240 and 2 subsequent values were 334 and 322.  Low level elevation compatible with myocardial injury in the setting of end-stage renal disease and perhaps a contribution  from potential transient hypotension.  This is not appear to be ACS.  Nonetheless, elevated troponin does portend a worsened prognosis. - Echocardiogram 05/23/2024, this admit, with mildly reduced ejection fraction 45 to 50% and moderate asymmetric LVH of basal septum.  No mitral regurgitation.  RV function is low normal.  Aortic root 46 mm with borderline dilation of ascending aorta 39 mm.  Trivial aortic regurgitation.- Telemetry monitoring has been normal.  Overall not markedly abnormal.  Loss of consciousness/sensorium - Previously at dialysis center he described to me a sensation of blowing up then does not recall anything until he was on the ambulance.  Per report, sternal rub seem to help as well as IV fluids.  Overnight noted by nurse sudden rigidity and shaking with suspicion of seizure activity.  MRI unremarkable.  EEG no seizure activity.  Transient hypotension likely culprit especially in light of markedly abnormal orthostatic blood pressures with blood pressure of 61 standing.  In the setting of hypotension, patients can become rigid and have convulsive like activity briefly, we see this with vasovagal syncope.  No arrhythmia detected during episode.  Urinalysis, urine toxicology unremarkable. --Dizzy, BP 100 SBP.  We stopped amlodipine  10 mg and carvedilol  25 twice daily yesterday and replaced with Toprol -XL 50 mg once a day.  I will go ahead and stop Toprol -XL today to support his blood pressure as best as possible.  Peripheral  vascular disease - Left subclavian stent noted.  Vascular following.  End-stage renal disease on hemodialysis - Nephrology following.  No other recommendations at this time.  Please let us  know if we can be of further assistance.  We will sign off.  For questions or updates, please contact [facility] Please consult [email or web address] for contact info under         Signed, Oneil Parchment, MD   "

## 2024-05-24 NOTE — Progress Notes (Signed)
 Carotid duplex  has been completed. Refer to [facility] under chart review to view preliminary results.   05/24/2024  12:36 PM Charlotta Lapaglia, Ricka BIRCH

## 2024-05-24 NOTE — Progress Notes (Signed)
 Physical Therapy Treatment Patient Details Name: Kelly Dunn MRN: [id number] DOB: 01/08/1946 Today's Date: 05/24/2024   History of Present Illness Pt is 78 year old presented to [facility]  [facility]24 for unresponsiveness at HD. In ED possible seizure episode vs convulsive syncope. PMH - ESRD on HD, DM, HTN, HLD, CKDV,    PT Comments  Pt greeted supine in bed, pleasant and agreeable to PT session. He continues to be limited by symptomatic orthostatic hypotension. Pt's SBP dropped 27 mmHg from supine>sit and DBP dropped 19 mmHg from sit>stand. Transferred pt to recliner chair to improve upright tolerance. Educated pt on BLE exercises he could performed while seated or supine. He completed a few while in the recliner chair. Deferred further mobility as pt's MAP <60 in standing, initially 56 decreasing to 55 after 3 minutes. Updated RN and MD. Recommend OOB<>chair 3x/day and Rosalynn Pleasant. Will continue to follow acutely and advance appropriately.    05/24/24 1035  Vital Signs  Patient Position (if appropriate) Orthostatic Vitals  Orthostatic Lying   BP- Lying 115/42  Pulse- Lying 75  Orthostatic Sitting  BP- Sitting (!) 88/69  Pulse- Sitting 76  Orthostatic Standing at 0 minutes  BP- Standing at 0 minutes (!) 78/50 (MAP 56)  Pulse- Standing at 0 minutes 78  Orthostatic Standing at 3 minutes  BP- Standing at 3 minutes (!) 76/44 (MAP 55)  Pulse- Standing at 3 minutes 76     If plan is discharge home, recommend the following: A lot of help with walking and/or transfers;A little help with bathing/dressing/bathroom;Help with stairs or ramp for entrance;Assist for transportation   Can travel by private vehicle     Yes  Equipment Recommendations  None recommended by PT    Recommendations for Other Services       Precautions / Restrictions Precautions Precautions: Fall Recall of Precautions/Restrictions: Intact Precaution/Restrictions Comments: Watch BP, (+) OH Restrictions Weight  Bearing Restrictions Per Provider Order: No     Mobility  Bed Mobility Overal bed mobility: Needs Assistance Bed Mobility: Supine to Sit     Supine to sit: Supervision, HOB elevated, Used rails     General bed mobility comments: Pt sat up on L side of bed with increased time. HOB elevated ~30deg. Use of bed rail. Pt brought BLE off EOB, elevated trunk, and scooted hips fwd.    Transfers Overall transfer level: Needs assistance Equipment used: Rolling walker (2 wheels) Transfers: Sit to/from Stand, Bed to chair/wheelchair/BSC Sit to Stand: Min assist   Step pivot transfers: Min assist       General transfer comment: Pt stood from lowest bed height. Cued proper hand placement using RW. Powered up with minA. Transferred to recliner chair. Good eccentric control.    Ambulation/Gait               General Gait Details: Deferred d/t symptomatic orthostatic hypotension with MAP <60.   Stairs             Wheelchair Mobility     Tilt Bed    Modified Rankin (Stroke Patients Only)       Balance Overall balance assessment: Needs assistance Sitting-balance support: No upper extremity supported, Feet supported Sitting balance-Leahy Scale: Fair Sitting balance - Comments: Sat EOB   Standing balance support: Bilateral upper extremity supported, During functional activity, Reliant on assistive device for balance Standing balance-Leahy Scale: Poor Standing balance comment: Pt dependent on RW  Communication Communication Communication: No apparent difficulties  Cognition Arousal: Alert Behavior During Therapy: WFL for tasks assessed/performed   PT - Cognitive impairments: No apparent impairments                       PT - Cognition Comments: Pt A,Ox4 Following commands: Intact      Cueing Cueing Techniques: Verbal cues, Tactile cues  Exercises General Exercises - Lower Extremity Ankle Circles/Pumps: Seated,  Both, AROM, 10 reps Long Arc Quad: Seated, Both, AROM, 10 reps Hip Flexion/Marching: Seated, Both, AROM, 10 reps    General Comments General comments (skin integrity, edema, etc.): Orthostatic vitals taken, postive. Pt mildly symptomatic c/o lightheadedness.      Pertinent Vitals/Pain Pain Assessment Pain Assessment: No/denies pain    Home Living                          Prior Function            PT Goals (current goals can now be found in the care plan section) Acute Rehab PT Goals Patient Stated Goal: Get better PT Goal Formulation: With patient Time For Goal Achievement: 06/06/24 Potential to Achieve Goals: Good Progress towards PT goals: Progressing toward goals    Frequency    Min 2X/week      PT Plan      Co-evaluation              AM-PAC PT 6 Clicks Mobility   Outcome Measure  Help needed turning from your back to your side while in a flat bed without using bedrails?: A Little Help needed moving from lying on your back to sitting on the side of a flat bed without using bedrails?: A Little Help needed moving to and from a bed to a chair (including a wheelchair)?: A Little Help needed standing up from a chair using your arms (e.g., wheelchair or bedside chair)?: A Little Help needed to walk in hospital room?: Total Help needed climbing 3-5 steps with a railing? : Total 6 Click Score: 14    End of Session Equipment Utilized During Treatment: Gait belt Activity Tolerance: Treatment limited secondary to medical complications (Comment) (symptomatic orthostatic hypotension) Patient left: in chair;with chair alarm set;with call bell/phone within reach Nurse Communication: Mobility status;Other (comment) (BP response, rec for [facility]) PT Visit Diagnosis: Unsteadiness on feet (R26.81);Other abnormalities of gait and mobility (R26.89);Muscle weakness (generalized) (M62.81);Difficulty in walking, not elsewhere classified (R26.2)     Time:  1030-1055 PT Time Calculation (min) (ACUTE ONLY): 25 min  Charges:    $Therapeutic Exercise: 8-22 mins $Therapeutic Activity: 8-22 mins PT General Charges $$ ACUTE PT VISIT: 1 Visit                     Randall SAUNDERS, PT, DPT Acute Rehabilitation Services Office: [phone] Secure Chat Preferred  Delon CHRISTELLA Callander 05/24/2024, 11:41 AM

## 2024-05-24 NOTE — Assessment & Plan Note (Addendum)
-   Carotid ultrasound ordered on admission for syncope workup; found to have acute right IJ DVT Concurrently has right PermCath, likely precipitating cause - discussed with nephrology as well; will attempt HD via left arm fistula on 1/3 (s/p fix on 12/8 with VVS). He was able to complete this successfully  - Hopeful for PermCath to be removed outpatient soon after discharge once dialysis center comfortable with dialysis via his fistula - okay to transition heparin  to Eliquis  on 1/3 - loading dose Eliquis  until 1/10, then on 1/11 can transition to 5 mg BID

## 2024-05-25 DIAGNOSIS — N186 End stage renal disease: Secondary | ICD-10-CM | POA: Diagnosis not present

## 2024-05-25 DIAGNOSIS — I8290 Acute embolism and thrombosis of unspecified vein: Secondary | ICD-10-CM | POA: Diagnosis not present

## 2024-05-25 DIAGNOSIS — R42 Dizziness and giddiness: Secondary | ICD-10-CM | POA: Diagnosis not present

## 2024-05-25 DIAGNOSIS — R55 Syncope and collapse: Secondary | ICD-10-CM | POA: Diagnosis not present

## 2024-05-25 LAB — RENAL FUNCTION PANEL
Albumin: 3.3 g/dL — ABNORMAL LOW (ref 3.5–5.0)
Anion gap: 11 (ref 5–15)
BUN: 28 mg/dL — ABNORMAL HIGH (ref 8–23)
CO2: 29 mmol/L (ref 22–32)
Calcium: 9 mg/dL (ref 8.9–10.3)
Chloride: 96 mmol/L — ABNORMAL LOW (ref 98–111)
Creatinine, Ser: 4.92 mg/dL — ABNORMAL HIGH (ref 0.61–1.24)
GFR, Estimated: 12 mL/min — ABNORMAL LOW
Glucose, Bld: 124 mg/dL — ABNORMAL HIGH (ref 70–99)
Phosphorus: 3.6 mg/dL (ref 2.5–4.6)
Potassium: 4.3 mmol/L (ref 3.5–5.1)
Sodium: 136 mmol/L (ref 135–145)

## 2024-05-25 LAB — CBC WITH DIFFERENTIAL/PLATELET
Abs Immature Granulocytes: 0.02 K/uL (ref 0.00–0.07)
Basophils Absolute: 0 K/uL (ref 0.0–0.1)
Basophils Relative: 1 %
Eosinophils Absolute: 0.4 K/uL (ref 0.0–0.5)
Eosinophils Relative: 6 %
HCT: 28 % — ABNORMAL LOW (ref 39.0–52.0)
Hemoglobin: 9 g/dL — ABNORMAL LOW (ref 13.0–17.0)
Immature Granulocytes: 0 %
Lymphocytes Relative: 36 %
Lymphs Abs: 2.2 K/uL (ref 0.7–4.0)
MCH: 32.5 pg (ref 26.0–34.0)
MCHC: 32.1 g/dL (ref 30.0–36.0)
MCV: 101.1 fL — ABNORMAL HIGH (ref 80.0–100.0)
Monocytes Absolute: 0.7 K/uL (ref 0.1–1.0)
Monocytes Relative: 11 %
Neutro Abs: 2.8 K/uL (ref 1.7–7.7)
Neutrophils Relative %: 46 %
Platelets: 92 K/uL — ABNORMAL LOW (ref 150–400)
RBC: 2.77 MIL/uL — ABNORMAL LOW (ref 4.22–5.81)
RDW: 13.6 % (ref 11.5–15.5)
WBC: 6.1 K/uL (ref 4.0–10.5)
nRBC: 0 % (ref 0.0–0.2)

## 2024-05-25 LAB — HEPARIN LEVEL (UNFRACTIONATED)
Heparin Unfractionated: 0.83 [IU]/mL — ABNORMAL HIGH (ref 0.30–0.70)
Heparin Unfractionated: 0.69 [IU]/mL (ref 0.30–0.70)

## 2024-05-25 LAB — GLUCOSE, CAPILLARY
Glucose-Capillary: 97 mg/dL (ref 70–99)
Glucose-Capillary: 156 mg/dL — ABNORMAL HIGH (ref 70–99)
Glucose-Capillary: 80 mg/dL (ref 70–99)
Glucose-Capillary: 158 mg/dL — ABNORMAL HIGH (ref 70–99)

## 2024-05-25 NOTE — Plan of Care (Signed)
" °  Problem: Coping: Goal: Ability to adjust to condition or change in health will improve Outcome: Progressing   Problem: Fluid Volume: Goal: Ability to maintain a balanced intake and output will improve Outcome: Progressing   Problem: Health Behavior/Discharge Planning: Goal: Ability to identify and utilize available resources and services will improve Outcome: Progressing   Problem: Health Behavior/Discharge Planning: Goal: Ability to manage health-related needs will improve Outcome: Progressing   Problem: Metabolic: Goal: Ability to maintain appropriate glucose levels will improve Outcome: Progressing   Problem: Nutritional: Goal: Maintenance of adequate nutrition will improve Outcome: Progressing   Problem: Skin Integrity: Goal: Risk for impaired skin integrity will decrease Outcome: Progressing   Problem: Tissue Perfusion: Goal: Adequacy of tissue perfusion will improve Outcome: Progressing   Problem: Education: Goal: Knowledge of General Education information will improve Description: Including pain rating scale, medication(s)/side effects and non-pharmacologic comfort measures Outcome: Progressing   Problem: Clinical Measurements: Goal: Respiratory complications will improve Outcome: Progressing   Problem: Activity: Goal: Risk for activity intolerance will decrease Outcome: Progressing    Problem: Pain Managment: Goal: General experience of comfort will improve and/or be controlled Outcome: Progressing   Problem: Skin Integrity: Goal: Risk for impaired skin integrity will decrease Outcome: Progressing   Problem: Safety: Goal: Ability to remain free from injury will improve Outcome: Progressing   "

## 2024-05-25 NOTE — Progress Notes (Signed)
" °   05/24/24 1930  Vitals  Temp 98 F (36.7 C)  Temp Source Oral  BP (!) 114/47  MAP (mmHg) 68  Pulse Rate 68  Oxygen Therapy  SpO2 96 %  During Treatment Monitoring  Blood Flow Rate (mL/min) 0 mL/min  Arterial Pressure (mmHg) -150 mmHg  Venous Pressure (mmHg) 170 mmHg  TMP (mmHg) 3 mmHg  Ultrafiltration Rate (mL/min) 198 mL/min  Dialysate Flow Rate (mL/min) 300 ml/min  Dialysate Potassium Concentration 2  Dialysate Calcium  Concentration 2.5  Cumulative Fluid Removed (mL) per Treatment  0  HD Safety Checks Performed Yes  Intra-Hemodialysis Comments Tx completed  Post Treatment  Dialyzer Clearance Lightly streaked  Liters Processed 82.5  Fluid Removed (mL) 0 mL  Tolerated HD Treatment Yes  Post-Hemodialysis Comments  (pt stable)  Fistula / Graft Left Upper arm  No placement date or time found.   Placed prior to admission: Yes  Orientation: Left  Access Location: Upper arm  Site Condition No complications  Hemodialysis Catheter Right Internal jugular Double lumen Permanent (Tunneled)  Placement Date/Time: 05/04/24 1411   Placed prior to admission: Yes  Serial / Lot #: (c) [id number]  Expiration Date: 06/24/28  Time Out: Correct patient;Correct site;Correct procedure  Maximum sterile barrier precautions: Hand hygiene;Cap;Mask;Sterile...  Site Condition No complications  Blue Lumen Status Flushed;Heparin  locked  Red Lumen Status Flushed;Heparin  locked  Catheter fill solution Heparin  1000 units/ml  Catheter fill volume (Arterial) 1.6 cc  Catheter fill volume (Venous) 1.6  Dressing Type Transparent  Dressing Status Antimicrobial disc/dressing in place;Clean, Dry, Intact  Interventions New dressing  Drainage Description None  Dressing Change Due 05/30/24    "

## 2024-05-25 NOTE — Progress Notes (Signed)
 PHARMACY - ANTICOAGULATION CONSULT NOTE  Pharmacy Consult for Heparin  Indication: RIJ DVT  Allergies[1]  Patient Measurements: Height: 5' 9 (175.3 cm) Weight: 90.9 kg (200 lb 4.8 oz) IBW/kg (Calculated) : 70.7 HEPARIN  DW (KG): 89.6  Vital Signs: Temp: 98.2 F (36.8 C) (01/01 1159) Temp Source: Oral (01/01 1159) BP: 118/50 (01/01 1159) Pulse Rate: 67 (01/01 1159)  Labs: Recent Labs    05/23/24 0405 05/24/24 0604 05/24/24 2043 05/25/24 0606 05/25/24 1203 05/25/24 1442  HGB 10.6* 9.5*  --  9.0*  --   --   HCT 34.0* 30.0*  --  28.0*  --   --   PLT 108* 93*  --  92*  --   --   APTT  --   --  46*  --   --   --   LABPROT  --   --  14.7  --   --   --   INR  --   --  1.1  --   --   --   HEPARINUNFRC  --   --   --  0.83*  --  0.69  CREATININE 4.99* 7.20*  --   --  4.92*  --     Estimated Creatinine Clearance: 14.2 mL/min (A) (by C-G formula based on SCr of 4.92 mg/dL (H)).   Medical History: Past Medical History:  Diagnosis Date   Chronic kidney disease (CKD), stage III (moderate) (HCC)    Diabetes mellitus    type II   Dyspnea    with activity   Gout    Hypercholesteremia    Hypertension    Neuropathy associated with endocrine disorder     Assessment: 41 male admitted with dizziness and syncope found to have an acute right internal jugular DVT. Pharmacy consulted to dose IV heparin . Patient has been receiving SQ heparin , last dose at 05:15 and received 2500 units in HD catheter at 16:03. Not on anticoagulation prior to admit.  AM: heparin  level supra-therapeutic on 1400 units/hr. Bolus yesterday not given (d/c'd order). Per RN, no signs/symptoms of bleeding. CBC still in process  PM: Heparin  level therapeutic at 0.69 on infusion of 1200 units/h. Hgb mostly stable at 9 from 9.5. Plt stable in the 90s. No issues with heparin  infusion or bleeding reported.   Goal of Therapy:  Heparin  level 0.3-0.7 units/ml Monitor platelets by anticoagulation protocol: Yes    Plan:  Continue heparin  infusion at 1200 units/hr Check confirmatory heparin  level with AM labs Monitor CBC and heparin  level daily  Maurilio Patten, PharmD PGY1 Pharmacy Resident [facility] 05/25/2024 3:26 PM    [1]  Allergies Allergen Reactions   Nsaids Other (See Comments)    Contraindication due to CKD    Sertraline  Other (See Comments)    Hallucinations

## 2024-05-25 NOTE — Progress Notes (Addendum)
 PHARMACY - ANTICOAGULATION CONSULT NOTE  Pharmacy Consult for Heparin  Indication: RIJ DVT  Allergies[1]  Patient Measurements: Height: 5' 9 (175.3 cm) Weight: 90.9 kg (200 lb 4.8 oz) IBW/kg (Calculated) : 70.7 HEPARIN  DW (KG): 89.6  Vital Signs: Temp: 98.6 F (37 C) (01/01 0420) Temp Source: Oral (01/01 0420) BP: 121/39 (01/01 0420) Pulse Rate: 70 (01/01 0420)  Labs: Recent Labs    05/22/24 1347 05/22/24 1353 05/23/24 0405 05/24/24 0604 05/24/24 2043 05/25/24 0606  HGB 10.8* 11.9* 10.6* 9.5*  --   --   HCT 33.7* 35.0* 34.0* 30.0*  --   --   PLT 101*  --  108* 93*  --   --   APTT  --   --   --   --  46*  --   LABPROT  --   --   --   --  14.7  --   INR  --   --   --   --  1.1  --   HEPARINUNFRC  --   --   --   --   --  0.83*  CREATININE 3.91*  --  4.99* 7.20*  --   --     Estimated Creatinine Clearance: 9.7 mL/min (A) (by C-G formula based on SCr of 7.2 mg/dL (H)).   Medical History: Past Medical History:  Diagnosis Date   Chronic kidney disease (CKD), stage III (moderate) (HCC)    Diabetes mellitus    type II   Dyspnea    with activity   Gout    Hypercholesteremia    Hypertension    Neuropathy associated with endocrine disorder      Assessment: 41 male admitted with dizziness and syncope found to have an acute right internal jugular DVT. Pharmacy consulted to dose IV heparin . Patient has been receiving SQ heparin , last dose at 05:15 and received 2500 units in HD catheter at 16:03. Not on anticoagulation prior to admit.  AM: heparin  level supra-therapeutic on 1400 units/hr. Bolus yesterday not given (d/c'd order). Per RN, no signs/symptoms of bleeding. CBC still in process  Goal of Therapy:  Heparin  level 0.3-0.7 units/ml Monitor platelets by anticoagulation protocol: Yes   Plan:  Decrease heparin  infusion to 1200 units/hr Check anti-Xa level in 8 hours and daily while on heparin  Continue to monitor H&H and platelets  Lynwood Poplar, PharmD,  BCPS Clinical Pharmacist 05/25/2024 6:50 AM          [1]  Allergies Allergen Reactions   Nsaids Other (See Comments)    Contraindication due to CKD    Sertraline  Other (See Comments)    Hallucinations

## 2024-05-25 NOTE — Progress Notes (Addendum)
 " [name] Progress Note   Subjective:   S/p HD with no UF. Reports feeling well, denies SOB, CP, dizziness, nausea. Denies any pain in AVF.   Objective Vitals:   05/24/24 2107 05/25/24 0050 05/25/24 0420 05/25/24 0826  BP: (!) 106/41 (!) 124/40 (!) 121/39 (!) 131/48  Pulse: 73 65 70 66  Resp: 20 16 20 19   Temp: 98.6 F (37 C) 98.5 F (36.9 C) 98.6 F (37 C) 98.4 F (36.9 C)  TempSrc: Oral Oral Oral Oral  SpO2: 98% 99% 98% 99%  Weight:   90.9 kg   Height:       Physical Exam General: Well appearing, alert, NAD Heart: RRR Lungs: CTA anteriorly/laterally Abdomen: soft, non-distended. Extremities: no LE edema Dialysis Access: TDC in chest, LUE AVF +t/b with bruising  Additional Objective Labs: Basic Metabolic Panel: Recent Labs  Lab 05/22/24 1347 05/22/24 1353 05/22/24 1737 05/23/24 0405 05/24/24 0604  NA 139 138  --  139 138  K 3.4* 3.2*  --  4.2 4.8  CL 97*  --   --  100 99  CO2 30  --   --  28 26  GLUCOSE 168*  --   --  144* 83  BUN 19  --   --  27* 47*  CREATININE 3.91*  --   --  4.99* 7.20*  CALCIUM  8.6*  --   --  9.0 8.9  PHOS  --   --  3.9  --  5.8*   Liver Function Tests: Recent Labs  Lab 05/22/24 1347 05/24/24 0604  AST 21  --   ALT 18  --   ALKPHOS 100  --   BILITOT 0.6  --   PROT 6.8  --   ALBUMIN 3.9 3.2*   No results for input(s): LIPASE, AMYLASE in the last 168 hours. CBC: Recent Labs  Lab 05/22/24 1347 05/22/24 1353 05/23/24 0405 05/24/24 0604 05/25/24 0606  WBC 4.6  --  6.3 6.1 6.1  NEUTROABS 3.9  --   --  3.0 2.8  HGB 10.8*   < > 10.6* 9.5* 9.0*  HCT 33.7*   < > 34.0* 30.0* 28.0*  MCV 100.9*  --  101.5* 102.4* 101.1*  PLT 101*  --  108* 93* 92*   < > = values in this interval not displayed.   Blood Culture    Component Value Date/Time   SDES BLOOD RIGHT ARM 04/28/2024 1202   SPECREQUEST  04/28/2024 1202    BOTTLES DRAWN AEROBIC AND ANAEROBIC Blood Culture results may not be optimal due to an  inadequate volume of blood received in culture bottles   CULT  04/28/2024 1202    NO GROWTH 5 DAYS Performed at [facility], [address] [place], [name] [address]    REPTSTATUS 05/03/2024 FINAL 04/28/2024 1202    Cardiac Enzymes: No results for input(s): CKTOTAL, CKMB, CKMBINDEX, TROPONINI in the last 168 hours. CBG: Recent Labs  Lab 05/23/24 1643 05/24/24 0847 05/24/24 1258 05/24/24 2105 05/25/24 0823  GLUCAP 161* 88 176* 102* 97   Iron Studies: No results for input(s): IRON, TIBC, TRANSFERRIN, FERRITIN in the last 72 hours. @lablastinr3 @ Studies/Results: IR Repair Tun/Non Tun CV Cath W/O FL Result Date: 05/24/2024 CLINICAL DATA:  79 year old male. History of end-stage renal disease on hemodialysis via a right IJ tunneled catheter placed by surgery in May 04, 2024. Patient found to have a crack in the arterial port. EXAM: EXTERNAL REPAIR CV CATH COMPARISON:  None Available.  CONTRAST:  None-administered via the existing percutaneous drain. FLUOROSCOPY TIME:  None TECHNIQUE: All questions were addressed. Using sterile barrier technique was utilized using sterile gloves, hand hygiene and skin antiseptic. A time-out was performed prior to the initiation of the procedure The right chest was prepped and draped in a sterile fashion. The arterial port was cut above the fracture and a new hub was placed. FINDINGS: Tunneled dialysis catheter arterial port repair. The port was able to be flushed and aspirated without incident. IMPRESSION: Successful tunneled dialysis catheter arterial port repair. Performed by Kelly Beagle NP Electronically Signed   By: JONETTA Faes M.D.   On: 05/24/2024 16:50   VAS US  CAROTID Result Date: 05/24/2024 Carotid Arterial Duplex Study Patient Name:  Kelly Dunn  Date of Exam:   05/24/2024 Medical Rec #: [id number]           Accession #:    [id number] Date of Birth: Sep 19, 1945           Patient Gender: M Patient Age:   79 years  Exam Location:  [facility] Procedure:      VAS US  CAROTID Referring Phys: ADRIANA GRAMS --------------------------------------------------------------------------------  Indications:       Syncope. Risk Factors:      Hypertension, hyperlipidemia, Diabetes, coronary artery                    disease, PAD. Other Factors:     05/04/24 - Right insertion of tunneled dialysis catheter -                    right chest.                    History of left subclavian stent.                    07/15/20 -Non functional left arm Brachiocephalic AVF. Comparison Study:  No priors. Performing Technologist: Ricka Sturdivant-Jones RDMS, RVT  Examination Guidelines: A complete evaluation includes B-mode imaging, spectral Doppler, color Doppler, and power Doppler as needed of all accessible portions of each vessel. Bilateral testing is considered an integral part of a complete examination. Limited examinations for reoccurring indications may be performed as noted.  Right Carotid Findings: +----------+--------+--------+--------+------------------+------------------+           PSV cm/sEDV cm/sStenosisPlaque DescriptionComments           +----------+--------+--------+--------+------------------+------------------+ CCA Prox  59      12                                intimal thickening +----------+--------+--------+--------+------------------+------------------+ CCA Mid                                             intimal thickening +----------+--------+--------+--------+------------------+------------------+ CCA Distal68      11                                intimal thickening +----------+--------+--------+--------+------------------+------------------+ ICA Prox  75      19      1-39%   calcific                             +----------+--------+--------+--------+------------------+------------------+ ICA Distal86  17                                                    +----------+--------+--------+--------+------------------+------------------+ ECA       79                                                           +----------+--------+--------+--------+------------------+------------------+ +----------+--------+-------+----------------+-------------------+           PSV cm/sEDV cmsDescribe        Arm Pressure (mmHG) +----------+--------+-------+----------------+-------------------+ [id number]            Multiphasic, WNL                    +----------+--------+-------+----------------+-------------------+ +---------+--------+--+--------+--+---------+ VertebralPSV cm/s37EDV cm/s11Antegrade +---------+--------+--+--------+--+---------+ Incidental finding - Right IJV thrombus. Left Carotid Findings: +----------+--------+--------+--------+------------------+------------------+           PSV cm/sEDV cm/sStenosisPlaque DescriptionComments           +----------+--------+--------+--------+------------------+------------------+ CCA Prox  82      13                                                   +----------+--------+--------+--------+------------------+------------------+ CCA Mid                                             intimal thickening +----------+--------+--------+--------+------------------+------------------+ CCA Distal65      15                                intimal thickening +----------+--------+--------+--------+------------------+------------------+ ICA Prox  54      15      1-39%                     intimal thickening +----------+--------+--------+--------+------------------+------------------+ ICA Distal95      21                                                   +----------+--------+--------+--------+------------------+------------------+ ECA       70                                                           +----------+--------+--------+--------+------------------+------------------+  +----------+--------+--------+----------------+-------------------+           PSV cm/sEDV cm/sDescribe        Arm Pressure (mmHG) +----------+--------+--------+----------------+-------------------+ [id number]             Multiphasic, WNL                    +----------+--------+--------+----------------+-------------------+ +---------+--------+--+--------+--+---------+ VertebralPSV cm/s56EDV  cm/s13Antegrade +---------+--------+--+--------+--+---------+   Summary: Right Carotid: Velocities in the right ICA are consistent with a 1-39% stenosis.                 Incidental findings: acute right IJV deep vein thrombosis. The                distal subclavain vein appears patent. Left Carotid: Velocities in the left ICA are consistent with a 1-39% stenosis. Vertebrals:  Bilateral vertebral arteries demonstrate antegrade flow. Subclavians: Normal flow hemodynamics were seen in bilateral subclavian              arteries. *See table(s) above for measurements and observations.  Electronically signed by Fonda Rim on 05/24/2024 at 2:06:52 PM.    Final    ECHOCARDIOGRAM COMPLETE Result Date: 05/23/2024    ECHOCARDIOGRAM REPORT   Patient Name:   Kelly Dunn Date of Exam: 05/23/2024 Medical Rec #:  [id number]          Height:       72.0 in Accession #:    [id number]         Weight:       218.7 lb Date of Birth:  30-Nov-1945          BSA:          2.213 m Patient Age:    78 years           BP:           93/43 mmHg Patient Gender: M                  HR:           64 bpm. Exam Location:  Inpatient Procedure: 2D Echo, Cardiac Doppler, Color Doppler and Intracardiac            Opacification Agent (Both Spectral and Color Flow Doppler were            utilized during procedure). Indications:    Syncope R55  History:        Patient has no prior history of Echocardiogram examinations.                 Risk Factors:Hypertension, Diabetes and Former Smoker.  Sonographer:    Merlynn Argyle Referring Phys: [phone] MARK  C SKAINS  Sonographer Comments: Suboptimal subcostal window. Image acquisition challenging due to respiratory motion. IMPRESSIONS  1. Left ventricular ejection fraction, by estimation, is 45 to 50%. Left ventricular ejection fraction by 2D MOD biplane is 47.5 %. The left ventricle has mildly decreased function. The left ventricle demonstrates global hypokinesis. The left ventricular internal cavity size was mildly dilated. There is moderate asymmetric left ventricular hypertrophy of the basal-septal segment. Left ventricular diastolic parameters are consistent with Grade I diastolic dysfunction (impaired relaxation).  2. Right ventricular systolic function is low normal. The right ventricular size is normal. Tricuspid regurgitation signal is inadequate for assessing PA pressure.  3. Left atrial size was mildly dilated.  4. Right atrial size was mildly dilated.  5. The mitral valve is normal in structure. No evidence of mitral valve regurgitation. No evidence of mitral stenosis.  6. The aortic valve is tricuspid. Aortic valve regurgitation is trivial. No aortic stenosis is present.  7. Aortic dilatation noted. There is moderate dilatation of the aortic root, measuring 46 mm. There is borderline dilatation of the ascending aorta, measuring 39 mm.  8. The inferior vena cava is normal in size with greater than 50% respiratory variability, suggesting right  atrial pressure of 3 mmHg.  9. A small pericardial effusion is present. FINDINGS  Left Ventricle: Left ventricular ejection fraction, by estimation, is 45 to 50%. Left ventricular ejection fraction by 2D MOD biplane is 47.5 %. The left ventricle has mildly decreased function. The left ventricle demonstrates global hypokinesis. Definity  contrast agent was given IV to delineate the left ventricular endocardial borders. The left ventricular internal cavity size was mildly dilated. There is moderate asymmetric left ventricular hypertrophy of the basal-septal segment. Left  ventricular diastolic parameters are consistent with Grade I diastolic dysfunction (impaired relaxation). Right Ventricle: The right ventricular size is normal. No increase in right ventricular wall thickness. Right ventricular systolic function is low normal. Tricuspid regurgitation signal is inadequate for assessing PA pressure. Left Atrium: Left atrial size was mildly dilated. Right Atrium: Right atrial size was mildly dilated. Pericardium: A small pericardial effusion is present. Mitral Valve: The mitral valve is normal in structure. No evidence of mitral valve regurgitation. No evidence of mitral valve stenosis. Tricuspid Valve: The tricuspid valve is normal in structure. Tricuspid valve regurgitation is trivial. Aortic Valve: The aortic valve is tricuspid. Aortic valve regurgitation is trivial. Aortic regurgitation PHT measures 666 msec. No aortic stenosis is present. Pulmonic Valve: The pulmonic valve was normal in structure. Pulmonic valve regurgitation is not visualized. Aorta: Aortic dilatation noted. There is moderate dilatation of the aortic root, measuring 46 mm. There is borderline dilatation of the ascending aorta, measuring 39 mm. Venous: The inferior vena cava is normal in size with greater than 50% respiratory variability, suggesting right atrial pressure of 3 mmHg. IAS/Shunts: No atrial level shunt detected by color flow Doppler.  LEFT VENTRICLE PLAX 2D                        Biplane EF (MOD) LVIDd:         6.30 cm         LV Biplane EF:   Left LVIDs:         4.50 cm                          ventricular LV PW:         0.90 cm                          ejection LV IVS:        0.70 cm                          fraction by LVOT diam:     2.60 cm                          2D MOD LV SV:         113                              biplane is LV SV Index:   51                               47.5 %. LVOT Area:     5.31 cm  Diastology                                LV e' medial:     5.44 cm/s LV Volumes (MOD)               LV E/e' medial:  9.0 LV vol d, MOD    137.5 ml      LV e' lateral:   10.10 cm/s A2C:                           LV E/e' lateral: 4.8 LV vol d, MOD    173.0 ml A4C: LV vol s, MOD    66.4 ml A2C: LV vol s, MOD    102.0 ml A4C: LV SV MOD A2C:   71.1 ml LV SV MOD A4C:   173.0 ml LV SV MOD BP:    73.6 ml RIGHT VENTRICLE RV Basal diam:  3.70 cm RV S prime:     10.70 cm/s TAPSE (M-mode): 1.6 cm LEFT ATRIUM             Index        RIGHT ATRIUM           Index LA diam:        4.00 cm 1.81 cm/m   RA Area:     19.20 cm LA Vol (A2C):   59.8 ml 27.02 ml/m  RA Volume:   55.30 ml  24.99 ml/m LA Vol (A4C):   69.0 ml 31.18 ml/m LA Biplane Vol: 64.8 ml 29.28 ml/m  AORTIC VALVE LVOT Vmax:   92.50 cm/s LVOT Vmean:  67.600 cm/s LVOT VTI:    0.213 m AI PHT:      666 msec  AORTA Ao Root diam: 4.60 cm Ao Asc diam:  3.90 cm MITRAL VALVE MV Area (PHT): 2.02 cm    SHUNTS MV Decel Time: 375 msec    Systemic VTI:  0.21 m MV E velocity: 48.70 cm/s  Systemic Diam: 2.60 cm MV A velocity: 91.90 cm/s MV E/A ratio:  0.53 Dalton McleanMD Electronically signed by Kelly Dunn Signature Date/Time: 05/23/2024/5:01:41 PM    Final    EEG adult Result Date: 05/23/2024 Kelly Kelly KIDD, MD     05/23/2024 12:19 PM Patient Name: Kelly Dunn MRN: [id number] Epilepsy Attending: Arlin Dunn Kelly Referring Physician/Provider: Charlton Evalene RAMAN, MD Date: 05/23/2024 Duration: 23.46 mins Patient history: 79 yo M noted to become stiff and then shake for ~1 minute. He returned to baseline after ~5 minutes. EEG to evaluate for seizure Level of alertness: Awake, asleep AEDs during EEG study: None Technical aspects: This EEG study was done with scalp electrodes positioned according to the 10-20 International system of electrode placement. Electrical activity was reviewed with band pass filter of 1-70Hz , sensitivity of 7 uV/mm, display speed of 74mm/sec with a 60Hz  notched filter applied as appropriate. EEG data  were recorded continuously and digitally stored.  Video monitoring was available and reviewed as appropriate. Description: The posterior dominant rhythm consists of 8-9 Hz activity of moderate voltage (25-35 uV) seen predominantly in posterior head regions, symmetric and reactive to eye opening and eye closing. Sleep was characterized by vertex waves, sleep spindles (12 to 14 Hz), maximal frontocentral region. EEG showed continuous 3 to 6 Hz theta-delta slowing in right fronto-temporal region. Physiologic photic driving was not seen during photic stimulation. Hyperventilation was not performed.  ABNORMALITY - Continuous slow, right fronto-temporal region IMPRESSION: This study is suggestive of cortical dysfunction arising from right fronto-temporal region likely secondary to underlying structural abnormality. No seizures or epileptiform discharges were seen throughout the recording. Priyanka O Yadav   Medications:  heparin  1,200 Units/hr (05/25/24 0737)    allopurinol   100 mg Oral Daily   aspirin  EC  81 mg Oral Daily   atorvastatin   40 mg Oral Daily   calcitRIOL   0.25 mcg Oral Once per day on Monday Wednesday Friday   Chlorhexidine  Gluconate Cloth  6 each Topical Q0600   cholecalciferol   5,000 Units Oral Daily   ferrous sulfate   325 mg Oral Daily   insulin  aspart  0-6 Units Subcutaneous TID WC   melatonin  5 mg Oral QHS   midodrine  5 mg Oral Q M,W,F-HD   multivitamin with minerals  1 tablet Oral Daily   senna-docusate  1 tablet Oral QHS   sodium chloride  flush  3 mL Intravenous Q12H    Dialysis Orders: TTS - [facility] (1st HD 12/6 at [facility], only 3 outpatient HD so far) 4hr, 400/600, EDW 91kg, 2K/2Ca, TDC + AVF, heparin  2500 unit bolus - Getting course of Venofer - Calcitriol  0.25mcg PO q HD  Assessment/Plan:  Syncopal event at HD: Improved with IVF, likely hypotensive in nature.   Elevated troponin: Felt not ACS by cardiology  New seizure-like activity - AM 12/30: MRI without acute findings,  EEG without seizure.   ESRD: Will follow his outpatient HD unit holiday schedule this week - Mon, Wed, Saturday. Bruising around AVF but good thrill/bruit and no pain- will attempt to cannulate next HD  Hypertension/volume: Amlodipine /MTP stopped. BP better today. May need mido q HD - follow.  Anemia: Hgb 9-10 range - will start ESA as outpatient.  Metabolic bone disease: Ca ok, Phos close to goal, not on binders yet. Continue renal diet.   Lucie Collet, PA-C 05/25/2024, 9:34 AM  [facility] Pager: [phone]    Seen and examined independently.  Agree with note and exam as documented above by physician extender and as noted here.  General adult male in bed in no acute distress HEENT normocephalic atraumatic extraocular movements intact sclera anicteric Neck supple trachea midline Lungs clear to auscultation bilaterally normal work of breathing at rest on room air Heart S1S2 no rub Abdomen soft nontender nondistended Extremities no pitting edema  Psych normal mood and affect Neuro alert and oriented x3 provides hx and follows commands  Access RIJ tunn catheter in place    Syncope - presented from HD after hypotension/unresponsiveness/syncope on HD - we have discontinued amlodipine  to avoid hypotension   ESRD - on HD TTS schedule   New onset witnessed possible seizure activity - s/p EEG - per primary team  - imaging with unchanged hygroma called    HTN - hx of such - hypotension here - we have stopped amlodipine   - I have discontinued the pre-HD midodrine (this was new and hopefully by stopping the amlodipine  we've addressed the situation)   Anemia of CKD  - no current ESA needs - follow  Disposition - HD here Sat then disposition per primary team.  Reached out to primary team   Katheryn [name], MD 05/25/2024  11:34 AM    "

## 2024-05-25 NOTE — Progress Notes (Signed)
 " Progress Note    Kelly Dunn   FMW:[id number]  DOB: 05/21/1946  DOA: 05/22/2024     1 PCP: Regino Slater, MD  Initial CC: unresponsive   Hospital Course: Kelly Dunn is a 79 year old male with a significant history of ESRD (initiated HD 04/29/2024), Type 1 Diabetes, HTN, HLD, gout, and neuropathy. He was recently hospitalized (04/28/24) for uremic encephalopathy, which prompted the start of chronic hemodialysis.  During dialysis prior to admission he became transiently altered and briefly hypoxic.  Dialysis was aborted and he received small fluid bolus with return to baseline mentation.  Interval History:  Comfortable this morning.  No events overnight.  Reviewed carotid duplex findings with him bedside as well. He understands plan for trial of dialysis with next session via left arm fistula if able to cannulate.  Assessment and Plan: * Postural dizziness with presyncope - Brief altered mental status during hemodialysis - suspect component of convulsive syncope as etiology of admission - still orthostatic 12/30 when checked also - started on midodrine with HD on 12/31 due to ongoing dizziness with transfers and hypotension in general - BP regimen further adjusted.  Off all antihypertensives at this time and midodrine also being held for now -Trial to see how he tolerates next HD session on Saturday with no midodrine  Altered mental status-resolved as of 05/24/2024 - Strongly suspect hypotension contributed based off now seeing all the low BP issues since admission too  - Orthostatics very positive when checked on 12/30 also - now back to normal mentation  DVT (deep venous thrombosis) (HCC) - Carotid ultrasound ordered on admission for syncope workup; found to have acute right IJ DVT Concurrently has right PermCath, likely precipitating cause - discussed with nephrology as well; will attempt HD via left arm fistula on 1/3 (s/p fix on 12/8 with VVS) - Hopeful for  PermCath to be removed outpatient soon after discharge once dialysis center comfortable with dialysis via his fistula - start heparin  drip for now and monitor tolerance; then can transition to DOAC closer to d/c; let's wait until fistula cannulated successfully first with HD Saturday, then can transition at discharge   ESRD (end stage renal disease) [facility]) - Nephrology following - Dialysis per nephrology -BP medications have been all held at this time -Trying to attempt HD without midodrine to avoid too much ultrafiltration - Next HD session planned for Saturday through left arm fistula for retrial  Hypokalemia - replete as needed   Diabetic neuropathy (HCC) - Gabapentin held last admission due to confusion and encephalopathy  History of gout - On allopurinol   HLD (hyperlipidemia) - on statin  Elevated troponin Likely ischemic demand; combo of hypotension and HD - Appreciate cardiology evaluation as well -Echo obtained, EF 45 to 50%, global hypokinesis, grade 1 DD  DM (diabetes mellitus), type 2 (HCC) - A1c 4.3 on 04/28/24 -On last admission long-acting insulin  Lantus  has been discontinued -Continue checking CBG q. ACHS, SSI coverage    Antimicrobials:   DVT prophylaxis:  SCDs Start: 05/22/24 1533   Code Status:   Code Status: Full Code  Mobility Assessment (Last 72 Hours)     Mobility Assessment     Row Name 05/25/24 0800 05/24/24 2000 05/24/24 1100 05/24/24 0900 05/23/24 2000   Does the patient have exclusion criteria? No- Perform mobility assessment No- Perform mobility assessment -- No- Perform mobility assessment No- Perform mobility assessment   What is the highest level of mobility based on the mobility assessment? Level 3 (Stands with assistance) -  Balance while standing  and cannot march in place Level 3 (Stands with assistance) - Balance while standing  and cannot march in place Level 3 (Stands with assistance) - Balance while standing  and cannot march in  place Level 3 (Stands with assistance) - Balance while standing  and cannot march in place Level 3 (Stands with assistance) - Balance while standing  and cannot march in place   Is the above level different from baseline mobility prior to current illness? Yes - Recommend PT order Yes - Recommend PT order -- Yes - Recommend PT order Yes - Recommend PT order    Row Name 05/23/24 1300           Does the patient have exclusion criteria? No- Perform mobility assessment       What is the highest level of mobility based on the mobility assessment? Level 3 (Stands with assistance) - Balance while standing  and cannot march in place          Diet: Diet Orders (From admission, onward)     Start     Ordered   05/24/24 1550  Diet renal with fluid restriction Fluid restriction: 1200 mL Fluid; Room service appropriate? Yes; Fluid consistency: Thin  Diet effective now       Question Answer Comment  Fluid restriction: 1200 mL Fluid   Room service appropriate? Yes   Fluid consistency: Thin      05/24/24 1550            Barriers to discharge: None Disposition Plan: Home, tentatively Saturday after dialysis HH orders placed: TBD Status is: Inpatient  Objective: Blood pressure (!) 118/50, pulse 67, temperature 98.2 F (36.8 C), temperature source Oral, resp. rate 17, height 5' 9 (1.753 m), weight 90.9 kg, SpO2 98%.  Examination:  Physical Exam Constitutional:      General: He is not in acute distress.    Appearance: Normal appearance.  HENT:     Head: Normocephalic and atraumatic.     Mouth/Throat:     Mouth: Mucous membranes are moist.  Eyes:     Extraocular Movements: Extraocular movements intact.  Cardiovascular:     Rate and Rhythm: Normal rate and regular rhythm.  Pulmonary:     Effort: Pulmonary effort is normal. No respiratory distress.     Breath sounds: Normal breath sounds. No wheezing.  Abdominal:     General: Bowel sounds are normal. There is no distension.      Palpations: Abdomen is soft.     Tenderness: There is no abdominal tenderness.  Musculoskeletal:        General: Normal range of motion.     Cervical back: Normal range of motion and neck supple.  Skin:    General: Skin is warm and dry.  Neurological:     General: No focal deficit present.     Mental Status: He is alert.  Psychiatric:        Mood and Affect: Mood normal.        Behavior: Behavior normal.      Consultants:  Cardiology Nephrology  Procedures:    Data Reviewed: Results for orders placed or performed during the hospital encounter of 05/22/24 (from the past 24 hours)  APTT     Status: Abnormal   Collection Time: 05/24/24  8:43 PM  Result Value Ref Range   aPTT 46 (H) 24 - 36 seconds  Protime-INR     Status: None   Collection Time: 05/24/24  8:43 PM  Result Value Ref Range   Prothrombin Time 14.7 11.4 - 15.2 seconds   INR 1.1 0.8 - 1.2  Glucose, capillary     Status: Abnormal   Collection Time: 05/24/24  9:05 PM  Result Value Ref Range   Glucose-Capillary 102 (H) 70 - 99 mg/dL  Heparin  level (unfractionated)     Status: Abnormal   Collection Time: 05/25/24  6:06 AM  Result Value Ref Range   Heparin  Unfractionated 0.83 (H) 0.30 - 0.70 IU/mL  CBC with Differential/Platelet     Status: Abnormal   Collection Time: 05/25/24  6:06 AM  Result Value Ref Range   WBC 6.1 4.0 - 10.5 K/uL   RBC 2.77 (L) 4.22 - 5.81 MIL/uL   Hemoglobin 9.0 (L) 13.0 - 17.0 g/dL   HCT 71.9 (L) 60.9 - 47.9 %   MCV 101.1 (H) 80.0 - 100.0 fL   MCH 32.5 26.0 - 34.0 pg   MCHC 32.1 30.0 - 36.0 g/dL   RDW 86.3 88.4 - 84.4 %   Platelets 92 (L) 150 - 400 K/uL   nRBC 0.0 0.0 - 0.2 %   Neutrophils Relative % 46 %   Neutro Abs 2.8 1.7 - 7.7 K/uL   Lymphocytes Relative 36 %   Lymphs Abs 2.2 0.7 - 4.0 K/uL   Monocytes Relative 11 %   Monocytes Absolute 0.7 0.1 - 1.0 K/uL   Eosinophils Relative 6 %   Eosinophils Absolute 0.4 0.0 - 0.5 K/uL   Basophils Relative 1 %   Basophils Absolute 0.0  0.0 - 0.1 K/uL   Immature Granulocytes 0 %   Abs Immature Granulocytes 0.02 0.00 - 0.07 K/uL  Glucose, capillary     Status: None   Collection Time: 05/25/24  8:23 AM  Result Value Ref Range   Glucose-Capillary 97 70 - 99 mg/dL  Glucose, capillary     Status: Abnormal   Collection Time: 05/25/24 11:58 AM  Result Value Ref Range   Glucose-Capillary 156 (H) 70 - 99 mg/dL  Renal function panel     Status: Abnormal   Collection Time: 05/25/24 12:03 PM  Result Value Ref Range   Sodium 136 135 - 145 mmol/L   Potassium 4.3 3.5 - 5.1 mmol/L   Chloride 96 (L) 98 - 111 mmol/L   CO2 29 22 - 32 mmol/L   Glucose, Bld 124 (H) 70 - 99 mg/dL   BUN 28 (H) 8 - 23 mg/dL   Creatinine, Ser 5.07 (H) 0.61 - 1.24 mg/dL   Calcium  9.0 8.9 - 10.3 mg/dL   Phosphorus 3.6 2.5 - 4.6 mg/dL   Albumin 3.3 (L) 3.5 - 5.0 g/dL   GFR, Estimated 12 (L) >60 mL/min   Anion gap 11 5 - 15  Heparin  level (unfractionated)     Status: None   Collection Time: 05/25/24  2:42 PM  Result Value Ref Range   Heparin  Unfractionated 0.69 0.30 - 0.70 IU/mL    I have reviewed pertinent nursing notes, vitals, labs, and images as necessary. I have ordered labwork to follow up on as indicated.  I have reviewed the last notes from staff over past 24 hours. I have discussed patient's care plan and test results with nursing staff, CM/SW, and other staff as appropriate.  Old records reviewed in assessment of this patient  Time spent: Greater than 50% of the 55 minute visit was spent in counseling/coordination of care for the patient as laid out in the A&P.   LOS: 1 day  Alm Apo, MD [facility] 05/25/2024, 4:40 PM "

## 2024-05-26 ENCOUNTER — Other Ambulatory Visit (HOSPITAL_COMMUNITY): Payer: Self-pay

## 2024-05-26 ENCOUNTER — Inpatient Hospital Stay (HOSPITAL_COMMUNITY)
Admit: 2024-05-26 | Discharge: 2024-05-26 | Disposition: A | Discharge status: Home / Self Care | Attending: Nephrology | Admitting: Nephrology

## 2024-05-26 ENCOUNTER — Encounter (HOSPITAL_COMMUNITY): Payer: Self-pay | Admitting: Nephrology

## 2024-05-26 ENCOUNTER — Telehealth (HOSPITAL_COMMUNITY): Payer: Self-pay | Admitting: Pharmacy Technician

## 2024-05-26 DIAGNOSIS — R55 Syncope and collapse: Secondary | ICD-10-CM | POA: Diagnosis not present

## 2024-05-26 DIAGNOSIS — N186 End stage renal disease: Secondary | ICD-10-CM | POA: Diagnosis not present

## 2024-05-26 DIAGNOSIS — R42 Dizziness and giddiness: Secondary | ICD-10-CM | POA: Diagnosis not present

## 2024-05-26 DIAGNOSIS — I8290 Acute embolism and thrombosis of unspecified vein: Secondary | ICD-10-CM | POA: Diagnosis not present

## 2024-05-26 LAB — RENAL FUNCTION PANEL
Albumin: 3.5 g/dL (ref 3.5–5.0)
Anion gap: 11 (ref 5–15)
BUN: 39 mg/dL — ABNORMAL HIGH (ref 8–23)
CO2: 28 mmol/L (ref 22–32)
Calcium: 9.3 mg/dL (ref 8.9–10.3)
Chloride: 97 mmol/L — ABNORMAL LOW (ref 98–111)
Creatinine, Ser: 6.15 mg/dL — ABNORMAL HIGH (ref 0.61–1.24)
GFR, Estimated: 9 mL/min — ABNORMAL LOW
Glucose, Bld: 88 mg/dL (ref 70–99)
Phosphorus: 4.4 mg/dL (ref 2.5–4.6)
Potassium: 4.3 mmol/L (ref 3.5–5.1)
Sodium: 137 mmol/L (ref 135–145)

## 2024-05-26 LAB — CBC
HCT: 27.3 % — ABNORMAL LOW (ref 39.0–52.0)
Hemoglobin: 8.8 g/dL — ABNORMAL LOW (ref 13.0–17.0)
MCH: 32.4 pg (ref 26.0–34.0)
MCHC: 32.2 g/dL (ref 30.0–36.0)
MCV: 100.4 fL — ABNORMAL HIGH (ref 80.0–100.0)
Platelets: 93 K/uL — ABNORMAL LOW (ref 150–400)
RBC: 2.72 MIL/uL — ABNORMAL LOW (ref 4.22–5.81)
RDW: 13.6 % (ref 11.5–15.5)
WBC: 5.2 K/uL (ref 4.0–10.5)
nRBC: 0 % (ref 0.0–0.2)

## 2024-05-26 LAB — HEPARIN LEVEL (UNFRACTIONATED)
Heparin Unfractionated: 0.77 [IU]/mL — ABNORMAL HIGH (ref 0.30–0.70)
Heparin Unfractionated: 0.54 [IU]/mL (ref 0.30–0.70)

## 2024-05-26 LAB — GLUCOSE, CAPILLARY
Glucose-Capillary: 95 mg/dL (ref 70–99)
Glucose-Capillary: 123 mg/dL — ABNORMAL HIGH (ref 70–99)
Glucose-Capillary: 138 mg/dL — ABNORMAL HIGH (ref 70–99)
Glucose-Capillary: 164 mg/dL — ABNORMAL HIGH (ref 70–99)

## 2024-05-26 LAB — MAGNESIUM: Magnesium: 2.1 mg/dL (ref 1.7–2.4)

## 2024-05-26 MED ORDER — DARBEPOETIN ALFA 40 MCG/0.4ML IJ SOSY
40.0000 ug | PREFILLED_SYRINGE | INTRAMUSCULAR | Status: DC
  Administered 2024-05-26: 40 ug via SUBCUTANEOUS
  Filled 2024-05-26: qty 0.4

## 2024-05-26 MED ORDER — CHLORHEXIDINE GLUCONATE CLOTH 2 % EX PADS
6.0000 | MEDICATED_PAD | Freq: Every day | CUTANEOUS | Status: DC
  Administered 2024-05-26 – 2024-05-30 (×4): 6 via TOPICAL

## 2024-05-26 NOTE — Progress Notes (Signed)
 Physical Therapy Treatment Patient Details Name: Kelly Dunn MRN: [id number] DOB: 26-Feb-1946 Today's Date: 05/26/2024   History of Present Illness Pt is 79 year old [facility]  [facility] for unresponsiveness at HD. In ED possible seizure episode vs convulsive syncope. PMH - ESRD on HD, DM, HTN, HLD, CKDV,    PT Comments  Pt was asymptomatic with orthostatic hypotension, progressing out of bed mobility this session with good tolerance. Pt able to ambulate with AD and no physical assistance with a close chair follow for safety. PT will continue to treat pt while he is admitted. Continuing to recommend skilled physical therapy < 3 hours per day.   Supine BP: 139/48 (76) HR: 65 Seated BP: 128/52 (76), HR: 70 Standing BP: 109/52 (68), HR: 83 Standing for 3 mins BP: 74/54 (61), HR: 98 End of session following gait, supine BP: 152/61 (88), HR: 78 Pt asymptomatic throughout.      If plan is discharge home, recommend the following: A lot of help with walking and/or transfers;A little help with bathing/dressing/bathroom;Help with stairs or ramp for entrance;Assist for transportation   Can travel by private vehicle     Yes  Equipment Recommendations  None recommended by PT    Recommendations for Other Services       Precautions / Restrictions Precautions Precautions: Fall Recall of Precautions/Restrictions: Intact Precaution/Restrictions Comments: Watch BP, (+) OH Restrictions Weight Bearing Restrictions Per Provider Order: No     Mobility  Bed Mobility Overal bed mobility: Modified Independent             General bed mobility comments: Pt sat up on R side of bed, using R bed railing to assist with trunk management; HOB elevated. Pt able to complete supine to sit with HOB flat and no physical assistance. VC given for facilitating neutral position of body in bed.    Transfers Overall transfer level: Needs assistance Equipment used: Rolling walker (2  wheels) Transfers: Sit to/from Stand Sit to Stand: Contact guard assist           General transfer comment: Pt completed STS from EOB with RW and no physical assistance. VC given for hand placement and sequencing.    Ambulation/Gait Ambulation/Gait assistance: Contact guard assist Gait Distance (Feet): 250 Feet Assistive device: Rolling walker (2 wheels) Gait Pattern/deviations: Step-through pattern, Decreased stride length Gait velocity: reduced Gait velocity interpretation: <1.8 ft/sec, indicate of risk for recurrent falls   General Gait Details: Pt demonstrates reciprocal gait pattern and increased reliance on RW for improved stability. Pt displays good foot clearance bilaterally with no LOBs noted.   Stairs             Wheelchair Mobility     Tilt Bed    Modified Rankin (Stroke Patients Only)       Balance Overall balance assessment: Needs assistance Sitting-balance support: No upper extremity supported, Feet supported Sitting balance-Leahy Scale: Good Sitting balance - Comments: Sat EOB   Standing balance support: No upper extremity supported, During functional activity Standing balance-Leahy Scale: Fair Standing balance comment: pt able to stand for orthostatics without UE supported on RW with no LOB                            Communication Communication Communication: No apparent difficulties  Cognition Arousal: Alert Behavior During Therapy: WFL for tasks assessed/performed   PT - Cognitive impairments: No apparent impairments  Following commands: Intact      Cueing Cueing Techniques: Verbal cues, Gestural cues, Tactile cues  Exercises General Exercises - Lower Extremity Straight Leg Raises: Strengthening, Both, 10 reps, Supine    General Comments General comments (skin integrity, edema, etc.): Supine BP: 139/48 (76) HR: 65, Seated BP: 128/52 (76), HR: 70, standing BP: 109/52 (68), HR: 83,  standing for 3 mins BP: 74/54 (61), HR: 98, end of session supine BP: 152/61 (88), HR: 78      Pertinent Vitals/Pain Pain Assessment Pain Assessment: No/denies pain    Home Living                          Prior Function            PT Goals (current goals can now be found in the care plan section) Acute Rehab PT Goals Patient Stated Goal: Get better Progress towards PT goals: Progressing toward goals    Frequency    Min 2X/week      PT Plan      Co-evaluation              AM-PAC PT 6 Clicks Mobility   Outcome Measure  Help needed turning from your back to your side while in a flat bed without using bedrails?: A Little Help needed moving from lying on your back to sitting on the side of a flat bed without using bedrails?: A Little Help needed moving to and from a bed to a chair (including a wheelchair)?: A Little Help needed standing up from a chair using your arms (e.g., wheelchair or bedside chair)?: A Little Help needed to walk in hospital room?: A Little Help needed climbing 3-5 steps with a railing? : Total 6 Click Score: 16    End of Session Equipment Utilized During Treatment: Gait belt Activity Tolerance: Patient tolerated treatment well Patient left: in bed;with call bell/phone within reach;with bed alarm set Nurse Communication: Mobility status;Other (comment) (orthostatics) PT Visit Diagnosis: Unsteadiness on feet (R26.81);Other abnormalities of gait and mobility (R26.89);Muscle weakness (generalized) (M62.81);Difficulty in walking, not elsewhere classified (R26.2)     Time: 8646-8580 PT Time Calculation (min) (ACUTE ONLY): 26 min  Charges:    $Gait Training: 8-22 mins $Therapeutic Activity: 8-22 mins PT General Charges $$ ACUTE PT VISIT: 1 Visit                     Leontine Hilt DPT Acute Rehab Services [phone] Prefer contact via chat    Leontine NOVAK Hamish Banks 05/26/2024, 2:45 PM

## 2024-05-26 NOTE — Telephone Encounter (Signed)
 Patient Product/process Development Scientist completed.    The patient is insured through [facility] [employer]. Patient has Medicare and is not eligible for a copay card, but may be able to apply for patient assistance or Medicare RX Payment Plan (Patient Must reach out to their plan, if eligible for payment plan), if available.    Ran test claim for Eliquis 5 mg and the current 30 day co-pay is $249.45 due to deductible.  Ran test claim for Xarelto 20 mg and the current 30 day co-pay is $207.18 due to deductible.  This test claim was processed through [facility]- copay amounts may vary at other pharmacies due to pharmacy/plan contracts, or as the patient moves through the different stages of their insurance plan.     Reyes Sharps, CPHT Pharmacy Technician Patient Advocate Specialist Lead [facility], [facility], [facility] Patient Advocate Team Direct Number: [phone]  Fax: [phone]

## 2024-05-26 NOTE — Progress Notes (Signed)
 " Progress Note    Kelly Dunn   FMW:[id number]  DOB: Nov 06, 1945  DOA: 05/22/2024     2 PCP: Regino Slater, MD  Initial CC: unresponsive   Hospital Course: Kelly Dunn is a 79 year old male with a significant history of ESRD (initiated HD 04/29/2024), Type 1 Diabetes, HTN, HLD, gout, and neuropathy. He was recently hospitalized (04/28/24) for uremic encephalopathy, which prompted the start of chronic hemodialysis.  During dialysis prior to admission he became transiently altered and briefly hypoxic.  Dialysis was aborted and he received small fluid bolus with return to baseline mentation.  Interval History:  Still doing okay.  No further dizziness or lightheadedness.  Blood pressure improved on the monitor this morning as well.  Trial of dialysis through his left arm fistula tomorrow in efforts to remove PermCath outpatient if able.  Assessment and Plan: * Postural dizziness with presyncope - Brief altered mental status during hemodialysis - suspect component of convulsive syncope as etiology of admission - still orthostatic 12/30 when checked also - started on midodrine with HD on 12/31 due to ongoing dizziness with transfers and hypotension in general - BP regimen further adjusted.  Off all antihypertensives at this time and midodrine also being held for now; pressure currently holding steady -Trial to see how he tolerates next HD session on Saturday with no midodrine  Altered mental status-resolved as of 05/24/2024 - Strongly suspect hypotension contributed based off now seeing all the low BP issues since admission too  - Orthostatics very positive when checked on 12/30 also - now back to normal mentation  DVT (deep venous thrombosis) (HCC) - Carotid ultrasound ordered on admission for syncope workup; found to have acute right IJ DVT Concurrently has right PermCath, likely precipitating cause - discussed with nephrology as well; will attempt HD via left arm fistula  on 1/3 (s/p fix on 12/8 with VVS) - Hopeful for PermCath to be removed outpatient soon after discharge once dialysis center comfortable with dialysis via his fistula - start heparin  drip for now and monitor tolerance; then can transition to DOAC closer to d/c; let's wait until fistula cannulated successfully first with HD Saturday, then can transition at discharge   ESRD (end stage renal disease) [facility]) - Nephrology following - Dialysis per nephrology -BP medications have been all held at this time -Trying to attempt HD without midodrine to avoid too much ultrafiltration - Next HD session planned for Saturday through left arm fistula for retrial  DM (diabetes mellitus), type 2 (HCC) - A1c 4.3 on 04/28/24 -On last admission long-acting insulin  Lantus  has been discontinued -Continue checking CBG q. ACHS, SSI coverage  Hypokalemia - replete as needed   Elevated troponin Likely ischemic demand; combo of hypotension and HD - Appreciate cardiology evaluation as well -Echo obtained, EF 45 to 50%, global hypokinesis, grade 1 DD  Diabetic neuropathy (HCC) - Gabapentin held last admission due to confusion and encephalopathy  History of gout - On allopurinol   HLD (hyperlipidemia) - on statin    Antimicrobials:   DVT prophylaxis:  SCDs Start: 05/22/24 1533   Code Status:   Code Status: Full Code  Mobility Assessment (Last 72 Hours)     Mobility Assessment     Row Name 05/26/24 1400 05/26/24 0800 05/25/24 2133 05/25/24 0800 05/24/24 2000   Does the patient have exclusion criteria? -- No- Perform mobility assessment No- Perform mobility assessment No- Perform mobility assessment No- Perform mobility assessment   What is the highest level of mobility based on the mobility  assessment? Level 4 (Ambulates with assistance) - Balance while stepping forward/back - Complete Level 1 (Bedfast) - Unable to balance while sitting on edge of bed Level 1 (Bedfast) - Unable to balance while  sitting on edge of bed Level 1 (Bedfast) - Unable to balance while sitting on edge of bed Level 3 (Stands with assistance) - Balance while standing  and cannot march in place   Is the above level different from baseline mobility prior to current illness? -- Yes - Recommend PT order Yes - Recommend PT order Yes - Recommend PT order Yes - Recommend PT order    Row Name 05/24/24 1100 05/24/24 0900 05/23/24 2000       Does the patient have exclusion criteria? -- No- Perform mobility assessment No- Perform mobility assessment     What is the highest level of mobility based on the mobility assessment? Level 3 (Stands with assistance) - Balance while standing  and cannot march in place Level 3 (Stands with assistance) - Balance while standing  and cannot march in place Level 3 (Stands with assistance) - Balance while standing  and cannot march in place     Is the above level different from baseline mobility prior to current illness? -- Yes - Recommend PT order Yes - Recommend PT order        Diet: Diet Orders (From admission, onward)     Start     Ordered   05/24/24 1550  Diet renal with fluid restriction Fluid restriction: 1200 mL Fluid; Room service appropriate? Yes; Fluid consistency: Thin  Diet effective now       Question Answer Comment  Fluid restriction: 1200 mL Fluid   Room service appropriate? Yes   Fluid consistency: Thin      05/24/24 1550            Barriers to discharge: None PT Follow up Rec: Skilled Nursing-Short Term Rehab (<3 Hours/Day)05/26/2024 1400 Disposition Plan: SNF [facility]), tentatively Saturday after dialysis HH orders placed: TBD Status is: Inpatient  Objective: Blood pressure (!) 118/45, pulse 70, temperature 97.7 F (36.5 C), temperature source Oral, resp. rate 18, height 5' 9 (1.753 m), weight 90.9 kg, SpO2 99%.  Examination:  Physical Exam Constitutional:      General: He is not in acute distress.    Appearance: Normal appearance.  HENT:     Head:  Normocephalic and atraumatic.     Mouth/Throat:     Mouth: Mucous membranes are moist.  Eyes:     Extraocular Movements: Extraocular movements intact.  Cardiovascular:     Rate and Rhythm: Normal rate and regular rhythm.  Pulmonary:     Effort: Pulmonary effort is normal. No respiratory distress.     Breath sounds: Normal breath sounds. No wheezing.  Abdominal:     General: Bowel sounds are normal. There is no distension.     Palpations: Abdomen is soft.     Tenderness: There is no abdominal tenderness.  Musculoskeletal:        General: Normal range of motion.     Cervical back: Normal range of motion and neck supple.  Skin:    General: Skin is warm and dry.  Neurological:     General: No focal deficit present.     Mental Status: He is alert.  Psychiatric:        Mood and Affect: Mood normal.        Behavior: Behavior normal.      Consultants:  Cardiology Nephrology  Procedures:  Data Reviewed: Results for orders placed or performed during the hospital encounter of 05/22/24 (from the past 24 hours)  Glucose, capillary     Status: None   Collection Time: 05/25/24  5:09 PM  Result Value Ref Range   Glucose-Capillary 80 70 - 99 mg/dL  Glucose, capillary     Status: Abnormal   Collection Time: 05/25/24  9:25 PM  Result Value Ref Range   Glucose-Capillary 158 (H) 70 - 99 mg/dL  Heparin  level (unfractionated)     Status: Abnormal   Collection Time: 05/26/24  4:12 AM  Result Value Ref Range   Heparin  Unfractionated 0.77 (H) 0.30 - 0.70 IU/mL  CBC     Status: Abnormal   Collection Time: 05/26/24  4:12 AM  Result Value Ref Range   WBC 5.2 4.0 - 10.5 K/uL   RBC 2.72 (L) 4.22 - 5.81 MIL/uL   Hemoglobin 8.8 (L) 13.0 - 17.0 g/dL   HCT 72.6 (L) 60.9 - 47.9 %   MCV 100.4 (H) 80.0 - 100.0 fL   MCH 32.4 26.0 - 34.0 pg   MCHC 32.2 30.0 - 36.0 g/dL   RDW 86.3 88.4 - 84.4 %   Platelets 93 (L) 150 - 400 K/uL   nRBC 0.0 0.0 - 0.2 %  Renal function panel     Status: Abnormal    Collection Time: 05/26/24  4:12 AM  Result Value Ref Range   Sodium 137 135 - 145 mmol/L   Potassium 4.3 3.5 - 5.1 mmol/L   Chloride 97 (L) 98 - 111 mmol/L   CO2 28 22 - 32 mmol/L   Glucose, Bld 88 70 - 99 mg/dL   BUN 39 (H) 8 - 23 mg/dL   Creatinine, Ser 3.84 (H) 0.61 - 1.24 mg/dL   Calcium  9.3 8.9 - 10.3 mg/dL   Phosphorus 4.4 2.5 - 4.6 mg/dL   Albumin 3.5 3.5 - 5.0 g/dL   GFR, Estimated 9 (L) >60 mL/min   Anion gap 11 5 - 15  Magnesium     Status: None   Collection Time: 05/26/24  4:12 AM  Result Value Ref Range   Magnesium 2.1 1.7 - 2.4 mg/dL  Glucose, capillary     Status: None   Collection Time: 05/26/24  8:06 AM  Result Value Ref Range   Glucose-Capillary 95 70 - 99 mg/dL  Glucose, capillary     Status: Abnormal   Collection Time: 05/26/24 11:48 AM  Result Value Ref Range   Glucose-Capillary 123 (H) 70 - 99 mg/dL    I have reviewed pertinent nursing notes, vitals, labs, and images as necessary. I have ordered labwork to follow up on as indicated.  I have reviewed the last notes from staff over past 24 hours. I have discussed patient's care plan and test results with nursing staff, CM/SW, and other staff as appropriate.  Old records reviewed in assessment of this patient  Time spent: Greater than 50% of the 55 minute visit was spent in counseling/coordination of care for the patient as laid out in the A&P.   LOS: 2 days   Alm Apo, MD [facility] 05/26/2024, 4:27 PM "

## 2024-05-26 NOTE — Plan of Care (Signed)
" °  Problem: Education: Goal: Knowledge of General Education information will improve Description: Including pain rating scale, medication(s)/side effects and non-pharmacologic comfort measures Outcome: Progressing   Problem: Clinical Measurements: Goal: Will remain free from infection Outcome: Progressing Goal: Respiratory complications will improve Outcome: Progressing   Problem: Elimination: Goal: Will not experience complications related to bowel motility Outcome: Progressing   Problem: Pain Managment: Goal: General experience of comfort will improve and/or be controlled Outcome: Progressing   Problem: Health Behavior/Discharge Planning: Goal: Ability to manage health-related needs will improve Outcome: Not Progressing   Problem: Elimination: Goal: Will not experience complications related to urinary retention Outcome: Not Progressing   "

## 2024-05-26 NOTE — Progress Notes (Signed)
 PHARMACY - ANTICOAGULATION CONSULT NOTE  Pharmacy Consult for Heparin  Indication: RIJ DVT  Allergies[1]  Patient Measurements: Height: 5' 9 (175.3 cm) Weight: 90.9 kg (200 lb 6.4 oz) IBW/kg (Calculated) : 70.7 HEPARIN  DW (KG): 89.6  Vital Signs: Temp: 98.4 F (36.9 C) (01/02 0414) Temp Source: Oral (01/02 0414) BP: 153/53 (01/02 0414) Pulse Rate: 60 (01/02 0414)  Labs: Recent Labs    05/24/24 0604 05/24/24 2043 05/25/24 0606 05/25/24 1203 05/25/24 1442 05/26/24 0412  HGB 9.5*  --  9.0*  --   --  8.8*  HCT 30.0*  --  28.0*  --   --  27.3*  PLT 93*  --  92*  --   --  93*  APTT  --  46*  --   --   --   --   LABPROT  --  14.7  --   --   --   --   INR  --  1.1  --   --   --   --   HEPARINUNFRC  --   --  0.83*  --  0.69 0.77*  CREATININE 7.20*  --   --  4.92*  --  6.15*    Estimated Creatinine Clearance: 11.4 mL/min (A) (by C-G formula based on SCr of 6.15 mg/dL (H)).   Medical History: Past Medical History:  Diagnosis Date   Chronic kidney disease (CKD), stage III (moderate) (HCC)    Diabetes mellitus    type II   Dyspnea    with activity   Gout    Hypercholesteremia    Hypertension    Neuropathy associated with endocrine disorder     Assessment: 70 male admitted with dizziness and syncope found to have an acute right internal jugular DVT. Pharmacy consulted to dose IV heparin . Patient has been receiving SQ heparin , last dose at 05:15 and received 2500 units in HD catheter at 16:03. Not on anticoagulation prior to admit.  05/26/2024: Heparin  level 0.77, supra-therapeutic at heparin  1200 units/hr. No issues with infusion running or signs of bleeding per RN. Hgb decreased slightly to 8.8 mg/dL, PLT stable low at 93.   Goal of Therapy:  Heparin  level 0.3-0.7 units/ml Monitor platelets by anticoagulation protocol: Yes   Plan:  Decrease heparin  infusion to 1050 units/hr Recheck heparin  level in 8 hours Monitor heparin  level, CBC, and s/sx of bleeding  daily F/u long-term anticoagulation plan  Morna Breach, PharmD, BCPS PGY2 Cardiology Pharmacy Resident 05/26/2024 7:10 AM     [1]  Allergies Allergen Reactions   Nsaids Other (See Comments)    Contraindication due to CKD    Sertraline  Other (See Comments)    Hallucinations

## 2024-05-26 NOTE — Progress Notes (Addendum)
 " [facility] Progress Note   Subjective:   Reports he is hungry, otherwise no concerns. Denies SOB, CP, dizziness.   Objective Vitals:   05/26/24 0012 05/26/24 0414 05/26/24 0444 05/26/24 0802  BP: (!) 136/46 (!) 153/53  (!) 137/43  Pulse: 67 60  61  Resp: 18 18  18   Temp: 98.6 F (37 C) 98.4 F (36.9 C)  98.4 F (36.9 C)  TempSrc: Oral Oral  Oral  SpO2: 98% 96%  98%  Weight:   90.9 kg   Height:       Physical Exam  General: Well appearing, alert, NAD Heart: RRR Lungs: CTA anteriorly/laterally Abdomen: soft, non-distended. Extremities: no LE edema Dialysis Access: TDC in chest, LUE AVF +t/b with bruising  Additional Objective Labs: Basic Metabolic Panel: Recent Labs  Lab 05/24/24 0604 05/25/24 1203 05/26/24 0412  NA 138 136 137  K 4.8 4.3 4.3  CL 99 96* 97*  CO2 26 29 28   GLUCOSE 83 124* 88  BUN 47* 28* 39*  CREATININE 7.20* 4.92* 6.15*  CALCIUM  8.9 9.0 9.3  PHOS 5.8* 3.6 4.4   Liver Function Tests: Recent Labs  Lab 05/22/24 1347 05/24/24 0604 05/25/24 1203 05/26/24 0412  AST 21  --   --   --   ALT 18  --   --   --   ALKPHOS 100  --   --   --   BILITOT 0.6  --   --   --   PROT 6.8  --   --   --   ALBUMIN 3.9 3.2* 3.3* 3.5   No results for input(s): LIPASE, AMYLASE in the last 168 hours. CBC: Recent Labs  Lab 05/22/24 1347 05/22/24 1353 05/23/24 0405 05/24/24 0604 05/25/24 0606 05/26/24 0412  WBC 4.6  --  6.3 6.1 6.1 5.2  NEUTROABS 3.9  --   --  3.0 2.8  --   HGB 10.8*   < > 10.6* 9.5* 9.0* 8.8*  HCT 33.7*   < > 34.0* 30.0* 28.0* 27.3*  MCV 100.9*  --  101.5* 102.4* 101.1* 100.4*  PLT 101*  --  108* 93* 92* 93*   < > = values in this interval not displayed.   Blood Culture    Component Value Date/Time   SDES BLOOD RIGHT ARM 04/28/2024 1202   SPECREQUEST  04/28/2024 1202    BOTTLES DRAWN AEROBIC AND ANAEROBIC Blood Culture results may not be optimal due to an inadequate volume of blood received in culture bottles    CULT  04/28/2024 1202    NO GROWTH 5 DAYS Performed at [facility], [address] [place], [name] [address]    REPTSTATUS 05/03/2024 FINAL 04/28/2024 1202    Cardiac Enzymes: No results for input(s): CKTOTAL, CKMB, CKMBINDEX, TROPONINI in the last 168 hours. CBG: Recent Labs  Lab 05/25/24 0823 05/25/24 1158 05/25/24 1709 05/25/24 2125 05/26/24 0806  GLUCAP 97 156* 80 158* 95   Iron Studies: No results for input(s): IRON, TIBC, TRANSFERRIN, FERRITIN in the last 72 hours. @lablastinr3 @ Studies/Results: IR Repair Tun/Non Tun CV Cath W/O FL Result Date: 05/24/2024 CLINICAL DATA:  79 year old male. History of end-stage renal disease on hemodialysis via a right IJ tunneled catheter placed by surgery in May 04, 2024. Patient found to have a crack in the arterial port. EXAM: EXTERNAL REPAIR CV CATH COMPARISON:  None Available. CONTRAST:  None-administered via the existing percutaneous drain. FLUOROSCOPY TIME:  None TECHNIQUE: All questions were addressed. Using sterile barrier  technique was utilized using sterile gloves, hand hygiene and skin antiseptic. A time-out was performed prior to the initiation of the procedure The right chest was prepped and draped in a sterile fashion. The arterial port was cut above the fracture and a new hub was placed. FINDINGS: Tunneled dialysis catheter arterial port repair. The port was able to be flushed and aspirated without incident. IMPRESSION: Successful tunneled dialysis catheter arterial port repair. Performed by Delon Beagle NP Electronically Signed   By: JONETTA Faes M.D.   On: 05/24/2024 16:50   VAS US  CAROTID Result Date: 05/24/2024 Carotid Arterial Duplex Study Patient Name:  Kelly Dunn  Date of Exam:   05/24/2024 Medical Rec #: [id number]           Accession #:    [id number] Date of Birth: Jun 07, 1945           Patient Gender: M Patient Age:   79 years Exam Location:  [facility] Procedure:      VAS US   CAROTID Referring Phys: ADRIANA GRAMS --------------------------------------------------------------------------------  Indications:       Syncope. Risk Factors:      Hypertension, hyperlipidemia, Diabetes, coronary artery                    disease, PAD. Other Factors:     05/04/24 - Right insertion of tunneled dialysis catheter -                    right chest.                    History of left subclavian stent.                    07/15/20 -Non functional left arm Brachiocephalic AVF. Comparison Study:  No priors. Performing Technologist: Ricka Sturdivant-Jones RDMS, RVT  Examination Guidelines: A complete evaluation includes B-mode imaging, spectral Doppler, color Doppler, and power Doppler as needed of all accessible portions of each vessel. Bilateral testing is considered an integral part of a complete examination. Limited examinations for reoccurring indications may be performed as noted.  Right Carotid Findings: +----------+--------+--------+--------+------------------+------------------+           PSV cm/sEDV cm/sStenosisPlaque DescriptionComments           +----------+--------+--------+--------+------------------+------------------+ CCA Prox  59      12                                intimal thickening +----------+--------+--------+--------+------------------+------------------+ CCA Mid                                             intimal thickening +----------+--------+--------+--------+------------------+------------------+ CCA Distal68      11                                intimal thickening +----------+--------+--------+--------+------------------+------------------+ ICA Prox  75      19      1-39%   calcific                             +----------+--------+--------+--------+------------------+------------------+ ICA Distal86      17                                                   +----------+--------+--------+--------+------------------+------------------+  ECA       79                                                           +----------+--------+--------+--------+------------------+------------------+ +----------+--------+-------+----------------+-------------------+           PSV cm/sEDV cmsDescribe        Arm Pressure (mmHG) +----------+--------+-------+----------------+-------------------+ [id number]            Multiphasic, WNL                    +----------+--------+-------+----------------+-------------------+ +---------+--------+--+--------+--+---------+ VertebralPSV cm/s37EDV cm/s11Antegrade +---------+--------+--+--------+--+---------+ Incidental finding - Right IJV thrombus. Left Carotid Findings: +----------+--------+--------+--------+------------------+------------------+           PSV cm/sEDV cm/sStenosisPlaque DescriptionComments           +----------+--------+--------+--------+------------------+------------------+ CCA Prox  82      13                                                   +----------+--------+--------+--------+------------------+------------------+ CCA Mid                                             intimal thickening +----------+--------+--------+--------+------------------+------------------+ CCA Distal65      15                                intimal thickening +----------+--------+--------+--------+------------------+------------------+ ICA Prox  54      15      1-39%                     intimal thickening +----------+--------+--------+--------+------------------+------------------+ ICA Distal95      21                                                   +----------+--------+--------+--------+------------------+------------------+ ECA       70                                                           +----------+--------+--------+--------+------------------+------------------+ +----------+--------+--------+----------------+-------------------+           PSV  cm/sEDV cm/sDescribe        Arm Pressure (mmHG) +----------+--------+--------+----------------+-------------------+ [id number]             Multiphasic, WNL                    +----------+--------+--------+----------------+-------------------+ +---------+--------+--+--------+--+---------+ VertebralPSV cm/s56EDV cm/s13Antegrade +---------+--------+--+--------+--+---------+   Summary: Right Carotid: Velocities in the right ICA are consistent with a 1-39% stenosis.                 Incidental findings: acute right IJV deep vein thrombosis. The  distal subclavain vein appears patent. Left Carotid: Velocities in the left ICA are consistent with a 1-39% stenosis. Vertebrals:  Bilateral vertebral arteries demonstrate antegrade flow. Subclavians: Normal flow hemodynamics were seen in bilateral subclavian              arteries. *See table(s) above for measurements and observations.  Electronically signed by Fonda Rim on 05/24/2024 at 2:06:52 PM.    Final    Medications:  heparin  1,050 Units/hr (05/26/24 0858)    allopurinol   100 mg Oral Daily   aspirin  EC  81 mg Oral Daily   atorvastatin   40 mg Oral Daily   calcitRIOL   0.25 mcg Oral Once per day on Monday Wednesday Friday   Chlorhexidine  Gluconate Cloth  6 each Topical Q0600   cholecalciferol   5,000 Units Oral Daily   ferrous sulfate   325 mg Oral Daily   insulin  aspart  0-6 Units Subcutaneous TID WC   melatonin  5 mg Oral QHS   multivitamin with minerals  1 tablet Oral Daily   senna-docusate  1 tablet Oral QHS   sodium chloride  flush  3 mL Intravenous Q12H    Dialysis Orders: TTS - [facility] (1st HD 12/6 at [facility], only 3 outpatient HD so far) 4hr, 400/600, EDW 91kg, 2K/2Ca, TDC + AVF, heparin  2500 unit bolus - Getting course of Venofer - Calcitriol  0.25mcg PO q HD  Assessment/Plan:  Syncopal event at HD: Improved with IVF, likely hypotensive in nature. BP meds reduced as below.   Elevated troponin: Felt not ACS by  cardiology  New seizure-like activity - AM 12/30: MRI without acute findings, EEG without seizure.   ESRD: Will follow his outpatient HD unit holiday schedule this week - Mon, Wed, Saturday. Bruising around AVF but good thrill/bruit and no pain- will attempt to cannulate next HD  Hypertension/volume: Amlodipine /MTP stopped. Was on midodrine temporarily, has now been stopped.  Anemia: Hgb 9-10 range - will start ESA as outpatient.  Metabolic bone disease: Ca ok, Phos close to goal, not on binders yet. Continue renal diet.  DVT: found to have acute R internal jugular DVT. We are attempted to use his fistula next HD to hopefully be able to remove catheter asap  Lucie Collet, PA-C 05/26/2024, 9:48 AM  [facility] Pager: [phone]     Seen and examined independently.  Agree with note and exam as documented above by physician extender and as noted here.  General adult male in bed in no acute distress HEENT normocephalic atraumatic extraocular movements intact sclera anicteric Neck supple trachea midline Lungs clear to auscultation bilaterally normal work of breathing at rest on room air Heart S1S2 no rub Abdomen soft nontender nondistended Extremities no pitting edema  Psych normal mood and affect Neuro alert and oriented x3 provides hx and follows commands  Access RIJ tunn catheter in place; LUE AVF with bruit and thrill     Syncope - presented from HD after an episode of hypotension/unresponsiveness/syncope on HD - we have discontinued amlodipine  to avoid hypotension.  Note he was previously on coreg  and this has been stopped per team as well    ESRD - on HD TTS schedule   New onset witnessed possible seizure activity - s/p EEG - per primary team  - imaging with unchanged hygroma called    HTN - hx of such - hypotension here - we have stopped amlodipine  and note that coreg  is also stopped - I have discontinued the pre-HD midodrine (this was new and  hopefully  by stopping the amlodipine  we've addressed the situation)   Anemia of CKD  - Start ESA here aranesp  40 mcg every Friday while here- Hb 8.8.  not yet on ESA outpatient and he's a relatively recent start to dialysis   Disposition - HD here Sat then disposition per primary team.   Katheryn [name], MD 05/26/2024  1:19 PM    "

## 2024-05-26 NOTE — Progress Notes (Signed)
 PHARMACY - ANTICOAGULATION CONSULT NOTE  Pharmacy Consult for Heparin  Indication: RIJ DVT  Allergies[1]  Patient Measurements: Height: 5' 9 (175.3 cm) Weight: 90.9 kg (200 lb 6.4 oz) IBW/kg (Calculated) : 70.7 HEPARIN  DW (KG): 89.6  Vital Signs: Temp: 98.4 F (36.9 C) (01/02 1732) Temp Source: Oral (01/02 1732) BP: 136/47 (01/02 1732) Pulse Rate: 66 (01/02 1732)  Labs: Recent Labs    05/24/24 0604 05/24/24 0604 05/24/24 2043 05/25/24 0606 05/25/24 1203 05/25/24 1442 05/26/24 0412 05/26/24 1653  HGB 9.5*  --   --  9.0*  --   --  8.8*  --   HCT 30.0*  --   --  28.0*  --   --  27.3*  --   PLT 93*  --   --  92*  --   --  93*  --   APTT  --   --  46*  --   --   --   --   --   LABPROT  --   --  14.7  --   --   --   --   --   INR  --   --  1.1  --   --   --   --   --   HEPARINUNFRC  --    < >  --  0.83*  --  0.69 0.77* 0.54  CREATININE 7.20*  --   --   --  4.92*  --  6.15*  --    < > = values in this interval not displayed.    Estimated Creatinine Clearance: 11.4 mL/min (A) (by C-G formula based on SCr of 6.15 mg/dL (H)).   Medical History: Past Medical History:  Diagnosis Date   Chronic kidney disease (CKD), stage III (moderate) (HCC)    Diabetes mellitus    type II   Dyspnea    with activity   Gout    Hypercholesteremia    Hypertension    Neuropathy associated with endocrine disorder     Assessment: 4 male admitted with dizziness and syncope found to have an acute right internal jugular DVT. Pharmacy consulted to dose IV heparin . Patient has been receiving SQ heparin , last dose at 05:15 and received 2500 units in HD catheter at 16:03. Not on anticoagulation prior to admit.  05/26/2024: Heparin  level 0.77, supra-therapeutic at heparin  1200 units/hr. No issues with infusion running or signs of bleeding per RN. Hgb decreased slightly to 8.8 mg/dL, PLT stable low at 93.   PM update: heparin  level 0.54 is therapeutic on 1050 units/hr. No issues with the  infusion or bleeding reported per RN.  Goal of Therapy:  Heparin  level 0.3-0.7 units/ml Monitor platelets by anticoagulation protocol: Yes   Plan:  Continue heparin  infusion at 1050 units/hr Check confirmatory heparin  level with AM labs Monitor heparin  level, CBC, and s/sx of bleeding daily F/u long-term anticoagulation plan  Rocky Slade, PharmD, BCPS 05/26/2024 6:41 PM  Please check AMION for all [facility] Pharmacy phone numbers After 10:00 PM, call Main Pharmacy [phone]       [1]  Allergies Allergen Reactions   Nsaids Other (See Comments)    Contraindication due to CKD    Sertraline  Other (See Comments)    Hallucinations

## 2024-05-26 NOTE — TOC Progression Note (Signed)
 Transition of Care [facility]) - Progression Note    Patient Details  Name: Kelly Dunn MRN: [id number] Date of Birth: June 27, 1945  [facility]) CM/SW Contact  Kelly Dunn, [name] Phone Number: 05/26/2024, 9:52 AM  Clinical Narrative:  Home and Community/UHC auth received for [facility]: [id number], [id number], valid 1/1-1/6. Per Kia at [facility], possible bed availability over weekend. Updated MD who reports pt likely ready for dc after HD Saturday. SW will follow.   Kelly Dunn, MSW, LCSW [phone] (coverage)      Expected Discharge Plan: Skilled Nursing Facility Barriers to Discharge: Continued Medical Work up               Expected Discharge Plan and Services In-house Referral: Clinical Social Work     Living arrangements for the past 2 months:  (from home alone, recently at [facility])                                       Social Drivers of Health (SDOH) Interventions SDOH Screenings   Food Insecurity: No Food Insecurity (05/23/2024)  Housing: Low Risk (05/23/2024)  Transportation Needs: No Transportation Needs (05/23/2024)  Utilities: Not At Risk (05/23/2024)  Social Connections: Socially Isolated (05/23/2024)  Tobacco Use: Medium Risk (05/22/2024)    Readmission Risk Interventions     No data to display

## 2024-05-27 DIAGNOSIS — I8290 Acute embolism and thrombosis of unspecified vein: Secondary | ICD-10-CM | POA: Diagnosis not present

## 2024-05-27 DIAGNOSIS — R42 Dizziness and giddiness: Secondary | ICD-10-CM | POA: Diagnosis not present

## 2024-05-27 DIAGNOSIS — N186 End stage renal disease: Secondary | ICD-10-CM | POA: Diagnosis not present

## 2024-05-27 DIAGNOSIS — R55 Syncope and collapse: Secondary | ICD-10-CM | POA: Diagnosis not present

## 2024-05-27 LAB — CBC
HCT: 26.5 % — ABNORMAL LOW (ref 39.0–52.0)
Hemoglobin: 8.7 g/dL — ABNORMAL LOW (ref 13.0–17.0)
MCH: 32.7 pg (ref 26.0–34.0)
MCHC: 32.8 g/dL (ref 30.0–36.0)
MCV: 99.6 fL (ref 80.0–100.0)
Platelets: 97 K/uL — ABNORMAL LOW (ref 150–400)
RBC: 2.66 MIL/uL — ABNORMAL LOW (ref 4.22–5.81)
RDW: 13.7 % (ref 11.5–15.5)
WBC: 4.5 K/uL (ref 4.0–10.5)
nRBC: 0 % (ref 0.0–0.2)

## 2024-05-27 LAB — RENAL FUNCTION PANEL
Albumin: 3.2 g/dL — ABNORMAL LOW (ref 3.5–5.0)
Anion gap: 11 (ref 5–15)
BUN: 52 mg/dL — ABNORMAL HIGH (ref 8–23)
CO2: 29 mmol/L (ref 22–32)
Calcium: 9.2 mg/dL (ref 8.9–10.3)
Chloride: 98 mmol/L (ref 98–111)
Creatinine, Ser: 7.26 mg/dL — ABNORMAL HIGH (ref 0.61–1.24)
GFR, Estimated: 7 mL/min — ABNORMAL LOW
Glucose, Bld: 158 mg/dL — ABNORMAL HIGH (ref 70–99)
Phosphorus: 4.6 mg/dL (ref 2.5–4.6)
Potassium: 4.5 mmol/L (ref 3.5–5.1)
Sodium: 137 mmol/L (ref 135–145)

## 2024-05-27 LAB — GLUCOSE, CAPILLARY
Glucose-Capillary: 122 mg/dL — ABNORMAL HIGH (ref 70–99)
Glucose-Capillary: 94 mg/dL (ref 70–99)
Glucose-Capillary: 122 mg/dL — ABNORMAL HIGH (ref 70–99)
Glucose-Capillary: 126 mg/dL — ABNORMAL HIGH (ref 70–99)

## 2024-05-27 LAB — HEPARIN LEVEL (UNFRACTIONATED): Heparin Unfractionated: 0.41 [IU]/mL (ref 0.30–0.70)

## 2024-05-27 MED ORDER — NEPRO/CARBSTEADY PO LIQD
237.0000 mL | Freq: Two times a day (BID) | ORAL | Status: DC
  Administered 2024-05-27 – 2024-05-29 (×4): 237 mL via ORAL

## 2024-05-27 MED ORDER — APIXABAN 5 MG PO TABS
10.0000 mg | ORAL_TABLET | Freq: Two times a day (BID) | ORAL | Status: DC
  Administered 2024-05-27 – 2024-05-30 (×6): 10 mg via ORAL
  Filled 2024-05-27 (×6): qty 2

## 2024-05-27 MED ORDER — APIXABAN 5 MG PO TABS: 5.0000 mg | ORAL_TABLET | Freq: Two times a day (BID) | ORAL | Status: DC

## 2024-05-27 NOTE — Progress Notes (Signed)
 " Progress Note    Kelly Dunn   FMW:[id number]  DOB: 12-27-1945  DOA: 05/22/2024     3 PCP: Regino Slater, MD  Initial CC: unresponsive   Hospital Course: Tori Cupps is a 79 year old male with a significant history of ESRD (initiated HD 04/29/2024), Type 1 Diabetes, HTN, HLD, gout, and neuropathy. He was recently hospitalized (04/28/24) for uremic encephalopathy, which prompted the start of chronic hemodialysis.  During dialysis prior to admission he became transiently altered and briefly hypoxic.  Dialysis was aborted and he received small fluid bolus with return to baseline mentation.  Interval History:  Tolerated HD today without need for midodrine  and also via LUE fistula as well.  Can transition to Eliquis  also.  [facility] doesn't have a bed avail today. Will see how looks tomorrow. He's stable for discharge otherwise.   Assessment and Plan: * Postural dizziness with presyncope - Brief altered mental status during hemodialysis - suspect component of convulsive syncope as etiology of admission - still orthostatic 12/30 when checked also - started on midodrine  with HD on 12/31 due to ongoing dizziness with transfers and hypotension in general - BP regimen further adjusted.  Off all antihypertensives at this time and midodrine  also being held for now; pressure currently holding steady - tolerated HD on 1/3 with on midodrine    Altered mental status-resolved as of 05/24/2024 - Strongly suspect hypotension contributed based off now seeing all the low BP issues since admission too  - Orthostatics very positive when checked on 12/30 also - now back to normal mentation  DVT (deep venous thrombosis) (HCC) - Carotid ultrasound ordered on admission for syncope workup; found to have acute right IJ DVT Concurrently has right PermCath, likely precipitating cause - discussed with nephrology as well; will attempt HD via left arm fistula on 1/3 (s/p fix on 12/8 with VVS). He was able  to complete this successfully  - Hopeful for PermCath to be removed outpatient soon after discharge once dialysis center comfortable with dialysis via his fistula - okay to transition heparin  to Eliquis  on 1/3  ESRD (end stage renal disease) (HCC) - Nephrology following - Dialysis per nephrology -BP medications have been all held at this time - tolerated HD on 1/3 with no hypoTN and no midodrine  needed - also tolerated via LUE fistula - eventual outpt removal of permcath; defer timing to nephrology  DM (diabetes mellitus), type 2 (HCC) - A1c 4.3 on 04/28/24 -On last admission long-acting insulin  Lantus  has been discontinued -Continue checking CBG q. ACHS, SSI coverage  Hypokalemia - replete as needed   Elevated troponin Likely ischemic demand; combo of hypotension and HD - Appreciate cardiology evaluation as well -Echo obtained, EF 45 to 50%, global hypokinesis, grade 1 DD  Diabetic neuropathy (HCC) - Gabapentin held last admission due to confusion and encephalopathy  History of gout - On allopurinol   HLD (hyperlipidemia) - on statin    Antimicrobials:   DVT prophylaxis:  SCDs Start: 05/22/24 1533   Code Status:   Code Status: Full Code  Mobility Assessment (Last 72 Hours)     Mobility Assessment     Row Name 05/27/24 1400 05/27/24 0800 05/27/24 0001 05/26/24 2000 05/26/24 1400   Does the patient have exclusion criteria? -- No- Perform mobility assessment No- Perform mobility assessment No- Perform mobility assessment --   What is the highest level of mobility based on the mobility assessment? Level 4 (Ambulates with assistance) - Balance while stepping forward/back - Complete Level 1 (Bedfast) - Unable to  balance while sitting on edge of bed Level 3 (Stands with assistance) - Balance while standing  and cannot march in place Level 3 (Stands with assistance) - Balance while standing  and cannot march in place Level 4 (Ambulates with assistance) - Balance while  stepping forward/back - Complete   Is the above level different from baseline mobility prior to current illness? Yes - Recommend PT order Yes - Recommend PT order Yes - Recommend PT order Yes - Recommend PT order --    Row Name 05/26/24 0800 05/25/24 2133 05/25/24 0800 05/24/24 2000     Does the patient have exclusion criteria? No- Perform mobility assessment No- Perform mobility assessment No- Perform mobility assessment No- Perform mobility assessment    What is the highest level of mobility based on the mobility assessment? Level 1 (Bedfast) - Unable to balance while sitting on edge of bed Level 1 (Bedfast) - Unable to balance while sitting on edge of bed Level 1 (Bedfast) - Unable to balance while sitting on edge of bed Level 3 (Stands with assistance) - Balance while standing  and cannot march in place    Is the above level different from baseline mobility prior to current illness? Yes - Recommend PT order Yes - Recommend PT order Yes - Recommend PT order Yes - Recommend PT order       Diet: Diet Orders (From admission, onward)     Start     Ordered   05/24/24 1550  Diet renal with fluid restriction Fluid restriction: 1200 mL Fluid; Room service appropriate? Yes; Fluid consistency: Thin  Diet effective now       Question Answer Comment  Fluid restriction: 1200 mL Fluid   Room service appropriate? Yes   Fluid consistency: Thin      05/24/24 1550            Barriers to discharge: None PT Follow up Rec: Skilled Nursing-Short Term Rehab (<3 Hours/Day)05/26/2024 1400 Disposition Plan: SNF [facility]), once bed avail  HH orders placed: TBD Status is: Inpatient  Objective: Blood pressure (!) 144/52, pulse 63, temperature 98.4 F (36.9 C), temperature source Oral, resp. rate 20, height 5' 9 (1.753 m), weight 92.4 kg, SpO2 99%.  Examination:  Physical Exam Constitutional:      General: He is not in acute distress.    Appearance: Normal appearance.  HENT:     Head: Normocephalic and  atraumatic.     Mouth/Throat:     Mouth: Mucous membranes are moist.  Eyes:     Extraocular Movements: Extraocular movements intact.  Cardiovascular:     Rate and Rhythm: Normal rate and regular rhythm.  Pulmonary:     Effort: Pulmonary effort is normal. No respiratory distress.     Breath sounds: Normal breath sounds. No wheezing.  Abdominal:     General: Bowel sounds are normal. There is no distension.     Palpations: Abdomen is soft.     Tenderness: There is no abdominal tenderness.  Musculoskeletal:        General: Normal range of motion.     Cervical back: Normal range of motion and neck supple.  Skin:    General: Skin is warm and dry.  Neurological:     General: No focal deficit present.     Mental Status: He is alert.  Psychiatric:        Mood and Affect: Mood normal.        Behavior: Behavior normal.      Consultants:  Cardiology Nephrology  Procedures:    Data Reviewed: Results for orders placed or performed during the hospital encounter of 05/22/24 (from the past 24 hours)  Glucose, capillary     Status: Abnormal   Collection Time: 05/26/24  4:52 PM  Result Value Ref Range   Glucose-Capillary 138 (H) 70 - 99 mg/dL  Heparin  level (unfractionated)     Status: None   Collection Time: 05/26/24  4:53 PM  Result Value Ref Range   Heparin  Unfractionated 0.54 0.30 - 0.70 IU/mL  Glucose, capillary     Status: Abnormal   Collection Time: 05/26/24  9:48 PM  Result Value Ref Range   Glucose-Capillary 164 (H) 70 - 99 mg/dL  CBC     Status: Abnormal   Collection Time: 05/27/24  1:12 AM  Result Value Ref Range   WBC 4.5 4.0 - 10.5 K/uL   RBC 2.66 (L) 4.22 - 5.81 MIL/uL   Hemoglobin 8.7 (L) 13.0 - 17.0 g/dL   HCT 73.4 (L) 60.9 - 47.9 %   MCV 99.6 80.0 - 100.0 fL   MCH 32.7 26.0 - 34.0 pg   MCHC 32.8 30.0 - 36.0 g/dL   RDW 86.2 88.4 - 84.4 %   Platelets 97 (L) 150 - 400 K/uL   nRBC 0.0 0.0 - 0.2 %  Renal function panel     Status: Abnormal   Collection Time:  05/27/24  1:12 AM  Result Value Ref Range   Sodium 137 135 - 145 mmol/L   Potassium 4.5 3.5 - 5.1 mmol/L   Chloride 98 98 - 111 mmol/L   CO2 29 22 - 32 mmol/L   Glucose, Bld 158 (H) 70 - 99 mg/dL   BUN 52 (H) 8 - 23 mg/dL   Creatinine, Ser 2.73 (H) 0.61 - 1.24 mg/dL   Calcium  9.2 8.9 - 10.3 mg/dL   Phosphorus 4.6 2.5 - 4.6 mg/dL   Albumin 3.2 (L) 3.5 - 5.0 g/dL   GFR, Estimated 7 (L) >60 mL/min   Anion gap 11 5 - 15  Heparin  level (unfractionated)     Status: None   Collection Time: 05/27/24  1:12 AM  Result Value Ref Range   Heparin  Unfractionated 0.41 0.30 - 0.70 IU/mL  Glucose, capillary     Status: Abnormal   Collection Time: 05/27/24  7:53 AM  Result Value Ref Range   Glucose-Capillary 122 (H) 70 - 99 mg/dL  Glucose, capillary     Status: None   Collection Time: 05/27/24  1:25 PM  Result Value Ref Range   Glucose-Capillary 94 70 - 99 mg/dL    I have reviewed pertinent nursing notes, vitals, labs, and images as necessary. I have ordered labwork to follow up on as indicated.  I have reviewed the last notes from staff over past 24 hours. I have discussed patient's care plan and test results with nursing staff, CM/SW, and other staff as appropriate.  Old records reviewed in assessment of this patient  Time spent: Greater than 50% of the 55 minute visit was spent in counseling/coordination of care for the patient as laid out in the A&P.   LOS: 3 days   Alm Apo, MD [facility] 05/27/2024, 3:25 PM "

## 2024-05-27 NOTE — Progress Notes (Signed)
 " [facility] Progress Note   Subjective:   Seen at start of HD - able to cannulate his LUE AVF with 16g needles. 2L UFG and tolerating so far. No CP/dyspnea.  Objective Vitals:   05/27/24 0531 05/27/24 0756 05/27/24 0820 05/27/24 0827  BP: (!) 138/48 (!) 138/48 (!) 158/62 (!) 146/54  Pulse: 64 67 73 67  Resp: 16 16 (!) 28 (!) 23  Temp: 98.3 F (36.8 C) 98.3 F (36.8 C) 98.3 F (36.8 C)   TempSrc: Oral Oral Oral   SpO2: 96% 99% 97% 97%  Weight:   94.4 kg   Height:       Physical Exam General: Well appearing, NAD. Room air Heart: RRR Lungs: CTA anteriorly Abdomen: soft Extremities: no LE edema Dialysis Access:  TDC + LUE AVF (cannulated)  Additional Objective Labs: Basic Metabolic Panel: Recent Labs  Lab 05/25/24 1203 05/26/24 0412 05/27/24 0112  NA 136 137 137  K 4.3 4.3 4.5  CL 96* 97* 98  CO2 29 28 29   GLUCOSE 124* 88 158*  BUN 28* 39* 52*  CREATININE 4.92* 6.15* 7.26*  CALCIUM  9.0 9.3 9.2  PHOS 3.6 4.4 4.6   Liver Function Tests: Recent Labs  Lab 05/22/24 1347 05/24/24 0604 05/25/24 1203 05/26/24 0412 05/27/24 0112  AST 21  --   --   --   --   ALT 18  --   --   --   --   ALKPHOS 100  --   --   --   --   BILITOT 0.6  --   --   --   --   PROT 6.8  --   --   --   --   ALBUMIN 3.9   < > 3.3* 3.5 3.2*   < > = values in this interval not displayed.   CBC: Recent Labs  Lab 05/22/24 1347 05/22/24 1353 05/23/24 0405 05/24/24 0604 05/25/24 0606 05/26/24 0412 05/27/24 0112  WBC 4.6  --  6.3 6.1 6.1 5.2 4.5  NEUTROABS 3.9  --   --  3.0 2.8  --   --   HGB 10.8*   < > 10.6* 9.5* 9.0* 8.8* 8.7*  HCT 33.7*   < > 34.0* 30.0* 28.0* 27.3* 26.5*  MCV 100.9*  --  101.5* 102.4* 101.1* 100.4* 99.6  PLT 101*  --  108* 93* 92* 93* 97*   < > = values in this interval not displayed.   Medications:  heparin  1,050 Units/hr (05/26/24 1726)    allopurinol   100 mg Oral Daily   aspirin  EC  81 mg Oral Daily   atorvastatin   40 mg Oral Daily    calcitRIOL   0.25 mcg Oral Once per day on Monday Wednesday Friday   Chlorhexidine  Gluconate Cloth  6 each Topical Q0600   Chlorhexidine  Gluconate Cloth  6 each Topical Q0600   cholecalciferol   5,000 Units Oral Daily   darbepoetin (ARANESP ) injection - DIALYSIS  40 mcg Subcutaneous Q Fri-1800   feeding supplement (NEPRO CARB STEADY)  237 mL Oral BID BM   ferrous sulfate   325 mg Oral Daily   insulin  aspart  0-6 Units Subcutaneous TID WC   melatonin  5 mg Oral QHS   multivitamin with minerals  1 tablet Oral Daily   senna-docusate  1 tablet Oral QHS   sodium chloride  flush  3 mL Intravenous Q12H   Dialysis Orders TTS - [facility] (1st HD 12/6 [facility], only 3 outpatient HD so far)  4hr, 400/600, EDW 91kg, 2K/2Ca, TDC + AVF, heparin  2500 unit bolus - Getting course of Venofer - Calcitriol  0.25mcg PO q HD   Assessment/Plan:  Syncopal event at HD: Improved with IVF, likely hypotensive in nature. BP meds reduced as below.   Elevated troponin: Felt not ACS by cardiology  New seizure-like activity - AM 12/30: MRI without acute findings, EEG without seizure.   ESRD: Back to usual TTS schedule - HD now, 2L UFG using AVF.  Hypertension/volume: Amlodipine /MTP stopped. Was on midodrine  temporarily, has now been stopped.  Anemia: Hgb 9-10 range - continue Aranesp  40mcg q Friday this admit, on PO iron.  Metabolic bone disease: Ca ok, Phos close to goal, not on binders yet. Continue renal diet.   R internal jugular DVT: Acute DVT, near his TDC. Using AVF today. If all goes well, can plan to have TDC removed. On heparin  drip -> plan is Eliquis .  Izetta Boehringer, PA-C 05/27/2024, 8:34 AM  [facility]    "

## 2024-05-27 NOTE — Plan of Care (Signed)

## 2024-05-27 NOTE — TOC Progression Note (Signed)
 Transition of Care [facility]) - Progression Note    Patient Details  Name: Kelly Dunn MRN: [id number] Date of Birth: 1946-01-20  Transition of Care [facility]) CM/SW Contact  Hartley KATHEE Robertson, LCSWA Phone Number: 05/27/2024, 2:33 PM  Clinical Narrative:     No bed availability today at [facility], possibly tomorrow.   Expected Discharge Plan: Skilled Nursing Facility Barriers to Discharge: Continued Medical Work up               Expected Discharge Plan and Services In-house Referral: Clinical Social Work     Living arrangements for the past 2 months:  (from home alone, recently at [facility])                                       Social Drivers of Health (SDOH) Interventions SDOH Screenings   Food Insecurity: No Food Insecurity (05/23/2024)  Housing: Low Risk (05/23/2024)  Transportation Needs: No Transportation Needs (05/23/2024)  Utilities: Not At Risk (05/23/2024)  Social Connections: Socially Isolated (05/23/2024)  Tobacco Use: Medium Risk (05/22/2024)    Readmission Risk Interventions     No data to display

## 2024-05-27 NOTE — Progress Notes (Signed)
 PHARMACY - ANTICOAGULATION CONSULT NOTE  Pharmacy Consult for Heparin  Indication: RIJ DVT  Allergies[1]  Patient Measurements: Height: 5' 9 (175.3 cm) Weight: 90.9 kg (200 lb 6.4 oz) IBW/kg (Calculated) : 70.7 HEPARIN  DW (KG): 89.6  Vital Signs: Temp: 98 F (36.7 C) (01/02 2328) Temp Source: Oral (01/02 2328) BP: 146/50 (01/02 2328) Pulse Rate: 60 (01/02 2328)  Labs: Recent Labs    05/24/24 2043 05/25/24 0606 05/25/24 1203 05/25/24 1442 05/26/24 0412 05/26/24 1653 05/27/24 0112  HGB  --  9.0*  --   --  8.8*  --  8.7*  HCT  --  28.0*  --   --  27.3*  --  26.5*  PLT  --  92*  --   --  93*  --  97*  APTT 46*  --   --   --   --   --   --   LABPROT 14.7  --   --   --   --   --   --   INR 1.1  --   --   --   --   --   --   HEPARINUNFRC  --  0.83*  --    < > 0.77* 0.54 0.41  CREATININE  --   --  4.92*  --  6.15*  --  7.26*   < > = values in this interval not displayed.    Estimated Creatinine Clearance: 9.6 mL/min (A) (by C-G formula based on SCr of 7.26 mg/dL (H)).   Medical History: Past Medical History:  Diagnosis Date   Chronic kidney disease (CKD), stage III (moderate) (HCC)    Diabetes mellitus    type II   Dyspnea    with activity   Gout    Hypercholesteremia    Hypertension    Neuropathy associated with endocrine disorder     Assessment: 68 male admitted with dizziness and syncope found to have an acute right internal jugular DVT. Pharmacy consulted to dose IV heparin . Patient has been receiving SQ heparin , last dose at 05:15 and received 2500 units in HD catheter at 16:03. Not on anticoagulation prior to admit.  Heparin  level this AM is within goal range at 0.41.  No overt bleeding or complications noted.  CBC fairly stable.  Goal of Therapy:  Heparin  level 0.3-0.7 units/ml Monitor platelets by anticoagulation protocol: Yes   Plan:  Continue heparin  infusion at 1050 units/hr Monitor heparin  level, CBC, and s/sx of bleeding daily F/u long-term  anticoagulation plan   Harlene Denna Berdine JONETTA ARABELLA, [facility] Clinical Pharmacist  05/27/2024 2:43 AM   [facility] pharmacy phone numbers are listed on [email or web address]         [1]  Allergies Allergen Reactions   Nsaids Other (See Comments)    Contraindication due to CKD    Sertraline  Other (See Comments)    Hallucinations

## 2024-05-27 NOTE — Progress Notes (Signed)
 Received patient in bed to unit.  Alert and oriented.  Informed consent signed and in chart.   TX duration:3.5   Patient tolerated well.  Transported back to the room  Alert, without acute distress.  Hand-off given to patient's nurse.   Access used: Left Upper arm Fistula Access issues: none  Total UF removed: 2L   05/27/24 1226  Vitals  Temp 98 F (36.7 C)  Temp Source Oral  BP (!) 145/61  MAP (mmHg) 86  Pulse Rate (!) 58  ECG Heart Rate 61  Resp (!) 23  Weight 92.4 kg  Type of Weight Post-Dialysis  Oxygen Therapy  SpO2 97 %  O2 Device Room Air  During Treatment Monitoring  HD Safety Checks Performed Yes  Intra-Hemodialysis Comments Tx completed  Post Treatment  Dialyzer Clearance Clear  Liters Processed 82.9  Fluid Removed (mL) 2000 mL  Tolerated HD Treatment Yes  Post-Hemodialysis Comments tolerated well  AVG/AVF Arterial Site Held (minutes) 7 minutes  AVG/AVF Venous Site Held (minutes) 7 minutes  Fistula / Graft Left Upper arm  No placement date or time found.   Placed prior to admission: Yes  Orientation: Left  Access Location: Upper arm  Site Condition No complications  Fistula / Graft Assessment Present;Thrill;Bruit  Status Patent;Deaccessed  Drainage Description None     Camellia Brasil LPN Kidney Dialysis Unit

## 2024-05-28 DIAGNOSIS — I8290 Acute embolism and thrombosis of unspecified vein: Secondary | ICD-10-CM | POA: Diagnosis not present

## 2024-05-28 DIAGNOSIS — R42 Dizziness and giddiness: Secondary | ICD-10-CM | POA: Diagnosis not present

## 2024-05-28 DIAGNOSIS — R55 Syncope and collapse: Secondary | ICD-10-CM | POA: Diagnosis not present

## 2024-05-28 DIAGNOSIS — N186 End stage renal disease: Secondary | ICD-10-CM | POA: Diagnosis not present

## 2024-05-28 LAB — RENAL FUNCTION PANEL
Albumin: 3.6 g/dL (ref 3.5–5.0)
Anion gap: 10 (ref 5–15)
BUN: 31 mg/dL — ABNORMAL HIGH (ref 8–23)
CO2: 30 mmol/L (ref 22–32)
Calcium: 9.6 mg/dL (ref 8.9–10.3)
Chloride: 98 mmol/L (ref 98–111)
Creatinine, Ser: 4.85 mg/dL — ABNORMAL HIGH (ref 0.61–1.24)
GFR, Estimated: 12 mL/min — ABNORMAL LOW
Glucose, Bld: 86 mg/dL (ref 70–99)
Phosphorus: 3.8 mg/dL (ref 2.5–4.6)
Potassium: 4.6 mmol/L (ref 3.5–5.1)
Sodium: 137 mmol/L (ref 135–145)

## 2024-05-28 LAB — HEPARIN LEVEL (UNFRACTIONATED): Heparin Unfractionated: >1.1 [IU]/mL — ABNORMAL HIGH (ref 0.30–0.70)

## 2024-05-28 LAB — CBC
HCT: 30.1 % — ABNORMAL LOW (ref 39.0–52.0)
Hemoglobin: 9.8 g/dL — ABNORMAL LOW (ref 13.0–17.0)
MCH: 32.5 pg (ref 26.0–34.0)
MCHC: 32.6 g/dL (ref 30.0–36.0)
MCV: 99.7 fL (ref 80.0–100.0)
Platelets: 106 K/uL — ABNORMAL LOW (ref 150–400)
RBC: 3.02 MIL/uL — ABNORMAL LOW (ref 4.22–5.81)
RDW: 13.5 % (ref 11.5–15.5)
WBC: 4.6 K/uL (ref 4.0–10.5)
nRBC: 0 % (ref 0.0–0.2)

## 2024-05-28 LAB — GLUCOSE, CAPILLARY
Glucose-Capillary: 90 mg/dL (ref 70–99)
Glucose-Capillary: 132 mg/dL — ABNORMAL HIGH (ref 70–99)
Glucose-Capillary: 122 mg/dL — ABNORMAL HIGH (ref 70–99)
Glucose-Capillary: 147 mg/dL — ABNORMAL HIGH (ref 70–99)

## 2024-05-28 MED ORDER — OXYCODONE HCL 5 MG PO TABS: 5.0000 mg | ORAL_TABLET | ORAL | Status: DC | PRN

## 2024-05-28 NOTE — Progress Notes (Signed)
 " Progress Note    Kelly Dunn   FMW:[id number]  DOB: Jun 07, 1945  DOA: 05/22/2024     4 PCP: Regino Slater, MD  Initial CC: unresponsive   Hospital Course: Kelly Dunn is a 79 year old male with a significant history of ESRD (initiated HD 04/29/2024), Type 1 Diabetes, HTN, HLD, gout, and neuropathy. He was recently hospitalized (04/28/24) for uremic encephalopathy, which prompted the start of chronic hemodialysis.  During dialysis prior to admission he became transiently altered and briefly hypoxic.  Dialysis was aborted and he received small fluid bolus with return to baseline mentation.  Interval History:  No events overnight.  Resting comfortably in bed.  No concerns this morning. Stable and awaiting placement at [facility] health care.  Assessment and Plan: * Postural dizziness with presyncope - Brief altered mental status during hemodialysis - suspect component of convulsive syncope as etiology of admission - still orthostatic 12/30 when checked also - started on midodrine  with HD on 12/31 due to ongoing dizziness with transfers and hypotension in general - BP regimen further adjusted.  Off all antihypertensives at this time and midodrine  also being held for now; pressure currently holding steady - tolerated HD on 1/3 with on midodrine    Altered mental status-resolved as of 05/24/2024 - Strongly suspect hypotension contributed based off now seeing all the low BP issues since admission too  - Orthostatics very positive when checked on 12/30 also - now back to normal mentation  DVT (deep venous thrombosis) (HCC) - Carotid ultrasound ordered on admission for syncope workup; found to have acute right IJ DVT Concurrently has right PermCath, likely precipitating cause - discussed with nephrology as well; will attempt HD via left arm fistula on 1/3 (s/p fix on 12/8 with VVS). He was able to complete this successfully  - Hopeful for PermCath to be removed outpatient soon  after discharge once dialysis center comfortable with dialysis via his fistula - okay to transition heparin  to Eliquis  on 1/3  ESRD (end stage renal disease) (HCC) - Nephrology following - Dialysis per nephrology -BP medications have been all held at this time - tolerated HD on 1/3 with no hypoTN and no midodrine  needed - also tolerated via LUE fistula - eventual outpt removal of permcath; defer timing to nephrology  DM (diabetes mellitus), type 2 (HCC) - A1c 4.3 on 04/28/24 -On last admission long-acting insulin  Lantus  has been discontinued -Continue checking CBG q. ACHS, SSI coverage  Hypokalemia - replete as needed   Elevated troponin Likely ischemic demand; combo of hypotension and HD - Appreciate cardiology evaluation as well -Echo obtained, EF 45 to 50%, global hypokinesis, grade 1 DD  Diabetic neuropathy (HCC) - Gabapentin held last admission due to confusion and encephalopathy  History of gout - On allopurinol   HLD (hyperlipidemia) - on statin    Antimicrobials:   DVT prophylaxis:  SCDs Start: 05/22/24 1533 apixaban  (ELIQUIS ) tablet 10 mg  apixaban  (ELIQUIS ) tablet 5 mg   Code Status:   Code Status: Full Code  Mobility Assessment (Last 72 Hours)     Mobility Assessment     Row Name 05/28/24 0723 05/27/24 2000 05/27/24 1400 05/27/24 0800 05/27/24 0001   Does the patient have exclusion criteria? No- Perform mobility assessment No- Perform mobility assessment -- No- Perform mobility assessment No- Perform mobility assessment   What is the highest level of mobility based on the mobility assessment? Level 3 (Stands with assistance) - Balance while standing  and cannot march in place Level 3 (Stands with assistance) - Balance  while standing  and cannot march in place Level 4 (Ambulates with assistance) - Balance while stepping forward/back - Complete Level 1 (Bedfast) - Unable to balance while sitting on edge of bed Level 3 (Stands with assistance) - Balance while  standing  and cannot march in place   Is the above level different from baseline mobility prior to current illness? Yes - Recommend PT order Yes - Recommend PT order Yes - Recommend PT order Yes - Recommend PT order Yes - Recommend PT order    Row Name 05/26/24 2000 05/26/24 1400 05/26/24 0800 05/25/24 2133     Does the patient have exclusion criteria? No- Perform mobility assessment -- No- Perform mobility assessment No- Perform mobility assessment    What is the highest level of mobility based on the mobility assessment? Level 3 (Stands with assistance) - Balance while standing  and cannot march in place Level 4 (Ambulates with assistance) - Balance while stepping forward/back - Complete Level 1 (Bedfast) - Unable to balance while sitting on edge of bed Level 1 (Bedfast) - Unable to balance while sitting on edge of bed    Is the above level different from baseline mobility prior to current illness? Yes - Recommend PT order -- Yes - Recommend PT order Yes - Recommend PT order       Diet: Diet Orders (From admission, onward)     Start     Ordered   05/24/24 1550  Diet renal with fluid restriction Fluid restriction: 1200 mL Fluid; Room service appropriate? Yes; Fluid consistency: Thin  Diet effective now       Question Answer Comment  Fluid restriction: 1200 mL Fluid   Room service appropriate? Yes   Fluid consistency: Thin      05/24/24 1550            Barriers to discharge: None PT Follow up Rec: Skilled Nursing-Short Term Rehab (<3 Hours/Day)05/26/2024 1400 Disposition Plan: SNF [facility]), once bed avail  HH orders placed: TBD Status is: Inpatient  Objective: Blood pressure (!) 130/43, pulse 66, temperature 98.3 F (36.8 C), temperature source Oral, resp. rate 18, height 5' 9 (1.753 m), weight 91.8 kg, SpO2 97%.  Examination:  Physical Exam Constitutional:      General: He is not in acute distress.    Appearance: Normal appearance.  HENT:     Head: Normocephalic and  atraumatic.     Mouth/Throat:     Mouth: Mucous membranes are moist.  Eyes:     Extraocular Movements: Extraocular movements intact.  Cardiovascular:     Rate and Rhythm: Normal rate and regular rhythm.  Pulmonary:     Effort: Pulmonary effort is normal. No respiratory distress.     Breath sounds: Normal breath sounds. No wheezing.  Abdominal:     General: Bowel sounds are normal. There is no distension.     Palpations: Abdomen is soft.     Tenderness: There is no abdominal tenderness.  Musculoskeletal:        General: Normal range of motion.     Cervical back: Normal range of motion and neck supple.  Skin:    General: Skin is warm and dry.  Neurological:     General: No focal deficit present.     Mental Status: He is alert.  Psychiatric:        Mood and Affect: Mood normal.        Behavior: Behavior normal.      Consultants:  Cardiology Nephrology  Procedures:  Data Reviewed: Results for orders placed or performed during the hospital encounter of 05/22/24 (from the past 24 hours)  Glucose, capillary     Status: None   Collection Time: 05/27/24  1:25 PM  Result Value Ref Range   Glucose-Capillary 94 70 - 99 mg/dL  Glucose, capillary     Status: Abnormal   Collection Time: 05/27/24  4:59 PM  Result Value Ref Range   Glucose-Capillary 122 (H) 70 - 99 mg/dL  Glucose, capillary     Status: Abnormal   Collection Time: 05/27/24  9:46 PM  Result Value Ref Range   Glucose-Capillary 126 (H) 70 - 99 mg/dL  CBC     Status: Abnormal   Collection Time: 05/28/24  4:33 AM  Result Value Ref Range   WBC 4.6 4.0 - 10.5 K/uL   RBC 3.02 (L) 4.22 - 5.81 MIL/uL   Hemoglobin 9.8 (L) 13.0 - 17.0 g/dL   HCT 69.8 (L) 60.9 - 47.9 %   MCV 99.7 80.0 - 100.0 fL   MCH 32.5 26.0 - 34.0 pg   MCHC 32.6 30.0 - 36.0 g/dL   RDW 86.4 88.4 - 84.4 %   Platelets 106 (L) 150 - 400 K/uL   nRBC 0.0 0.0 - 0.2 %  Renal function panel     Status: Abnormal   Collection Time: 05/28/24  4:33 AM   Result Value Ref Range   Sodium 137 135 - 145 mmol/L   Potassium 4.6 3.5 - 5.1 mmol/L   Chloride 98 98 - 111 mmol/L   CO2 30 22 - 32 mmol/L   Glucose, Bld 86 70 - 99 mg/dL   BUN 31 (H) 8 - 23 mg/dL   Creatinine, Ser 5.14 (H) 0.61 - 1.24 mg/dL   Calcium  9.6 8.9 - 10.3 mg/dL   Phosphorus 3.8 2.5 - 4.6 mg/dL   Albumin 3.6 3.5 - 5.0 g/dL   GFR, Estimated 12 (L) >60 mL/min   Anion gap 10 5 - 15  Heparin  level (unfractionated)     Status: Abnormal   Collection Time: 05/28/24  4:33 AM  Result Value Ref Range   Heparin  Unfractionated >1.10 (H) 0.30 - 0.70 IU/mL  Glucose, capillary     Status: None   Collection Time: 05/28/24  7:46 AM  Result Value Ref Range   Glucose-Capillary 90 70 - 99 mg/dL  Glucose, capillary     Status: Abnormal   Collection Time: 05/28/24 12:16 PM  Result Value Ref Range   Glucose-Capillary 132 (H) 70 - 99 mg/dL    I have reviewed pertinent nursing notes, vitals, labs, and images as necessary. I have ordered labwork to follow up on as indicated.  I have reviewed the last notes from staff over past 24 hours. I have discussed patient's care plan and test results with nursing staff, CM/SW, and other staff as appropriate.  Old records reviewed in assessment of this patient    LOS: 4 days   Alm Apo, MD [facility] 05/28/2024, 1:10 PM "

## 2024-05-28 NOTE — Progress Notes (Signed)
 " [facility] Progress Note   Subjective:   Seen in room - no CP/dyspnea or new issues. S/p HD yesterday and AVF used successfully.  Objective Vitals:   05/27/24 1948 05/28/24 0103 05/28/24 0545 05/28/24 0723  BP: 138/64 (!) 122/43 (!) 121/46 (!) 130/43  Pulse: 70 62 66   Resp: 20 16 20 18   Temp: 98.1 F (36.7 C) 98.3 F (36.8 C) 98.2 F (36.8 C) 98.3 F (36.8 C)  TempSrc: Oral Oral Oral Oral  SpO2: 98% 98% 99% 97%  Weight:   91.8 kg   Height:       Physical Exam General: Well appearing, NAD. Room air Heart: RRR Lungs: CTA anteriorly Abdomen: soft Extremities: no LE edema Dialysis Access:  TDC + LUE AVF  Additional Objective Labs: Basic Metabolic Panel: Recent Labs  Lab 05/26/24 0412 05/27/24 0112 05/28/24 0433  NA 137 137 137  K 4.3 4.5 4.6  CL 97* 98 98  CO2 28 29 30   GLUCOSE 88 158* 86  BUN 39* 52* 31*  CREATININE 6.15* 7.26* 4.85*  CALCIUM  9.3 9.2 9.6  PHOS 4.4 4.6 3.8   Liver Function Tests: Recent Labs  Lab 05/22/24 1347 05/24/24 0604 05/26/24 0412 05/27/24 0112 05/28/24 0433  AST 21  --   --   --   --   ALT 18  --   --   --   --   ALKPHOS 100  --   --   --   --   BILITOT 0.6  --   --   --   --   PROT 6.8  --   --   --   --   ALBUMIN 3.9   < > 3.5 3.2* 3.6   < > = values in this interval not displayed.   CBC: Recent Labs  Lab 05/22/24 1347 05/22/24 1353 05/24/24 0604 05/25/24 0606 05/26/24 0412 05/27/24 0112 05/28/24 0433  WBC 4.6   < > 6.1 6.1 5.2 4.5 4.6  NEUTROABS 3.9  --  3.0 2.8  --   --   --   HGB 10.8*   < > 9.5* 9.0* 8.8* 8.7* 9.8*  HCT 33.7*   < > 30.0* 28.0* 27.3* 26.5* 30.1*  MCV 100.9*   < > 102.4* 101.1* 100.4* 99.6 99.7  PLT 101*   < > 93* 92* 93* 97* 106*   < > = values in this interval not displayed.   Medications:   allopurinol   100 mg Oral Daily   apixaban   10 mg Oral BID   Followed by   NOREEN ON 06/03/2024] apixaban   5 mg Oral BID   aspirin  EC  81 mg Oral Daily   atorvastatin   40 mg Oral  Daily   calcitRIOL   0.25 mcg Oral Once per day on Monday Wednesday Friday   Chlorhexidine  Gluconate Cloth  6 each Topical Q0600   Chlorhexidine  Gluconate Cloth  6 each Topical Q0600   cholecalciferol   5,000 Units Oral Daily   darbepoetin (ARANESP ) injection - DIALYSIS  40 mcg Subcutaneous Q Fri-1800   feeding supplement (NEPRO CARB STEADY)  237 mL Oral BID BM   ferrous sulfate   325 mg Oral Daily   insulin  aspart  0-6 Units Subcutaneous TID WC   melatonin  5 mg Oral QHS   multivitamin with minerals  1 tablet Oral Daily   senna-docusate  1 tablet Oral QHS   sodium chloride  flush  3 mL Intravenous Q12H    Dialysis Orders TTS -  [facility] (1st HD 12/6 at [facility], only 3 outpatient HD so far) 4hr, 400/600, EDW 91kg, 2K/2Ca, TDC + AVF, heparin  2500 unit bolus - Getting course of Venofer - Calcitriol  0.25mcg PO q HD   Assessment/Plan:  Syncopal event at HD: Improved with IVF, likely hypotensive in nature. BP meds reduced as below.   Elevated troponin: Felt not ACS by cardiology  New seizure-like activity - AM 12/30: MRI without acute findings, EEG without seizure.   ESRD: Back to usual TTS schedule - next HD 1/6  Hypertension/volume: Amlodipine /MTP stopped. Was on midodrine  temporarily, has now been stopped.  Anemia: Hgb 9-10 range - continue Aranesp  40mcg q Friday this admit, on PO iron.  Metabolic bone disease: Ca/Phos ok, not on binders yet. Continue renal diet.   R internal jugular DVT: Acute DVT, near his TDC. Used AVF 1/3, if able to use again next HD can likely remove TDC. Now transitioned off IV heparin  to Eliquis .  Izetta Boehringer, PA-C 05/28/2024, 8:51 AM  [facility]    "

## 2024-05-28 NOTE — Plan of Care (Signed)

## 2024-05-29 LAB — RENAL FUNCTION PANEL
Albumin: 3.5 g/dL (ref 3.5–5.0)
Anion gap: 10 (ref 5–15)
BUN: 54 mg/dL — ABNORMAL HIGH (ref 8–23)
CO2: 29 mmol/L (ref 22–32)
Calcium: 9.7 mg/dL (ref 8.9–10.3)
Chloride: 100 mmol/L (ref 98–111)
Creatinine, Ser: 6.79 mg/dL — ABNORMAL HIGH (ref 0.61–1.24)
GFR, Estimated: 8 mL/min — ABNORMAL LOW
Glucose, Bld: 97 mg/dL (ref 70–99)
Phosphorus: 4.3 mg/dL (ref 2.5–4.6)
Potassium: 5 mmol/L (ref 3.5–5.1)
Sodium: 139 mmol/L (ref 135–145)

## 2024-05-29 LAB — CBC
HCT: 29.6 % — ABNORMAL LOW (ref 39.0–52.0)
Hemoglobin: 9.3 g/dL — ABNORMAL LOW (ref 13.0–17.0)
MCH: 32.2 pg (ref 26.0–34.0)
MCHC: 31.4 g/dL (ref 30.0–36.0)
MCV: 102.4 fL — ABNORMAL HIGH (ref 80.0–100.0)
Platelets: 105 K/uL — ABNORMAL LOW (ref 150–400)
RBC: 2.89 MIL/uL — ABNORMAL LOW (ref 4.22–5.81)
RDW: 13.5 % (ref 11.5–15.5)
WBC: 4.7 K/uL (ref 4.0–10.5)
nRBC: 0 % (ref 0.0–0.2)

## 2024-05-29 LAB — GLUCOSE, CAPILLARY
Glucose-Capillary: 113 mg/dL — ABNORMAL HIGH (ref 70–99)
Glucose-Capillary: 128 mg/dL — ABNORMAL HIGH (ref 70–99)
Glucose-Capillary: 147 mg/dL — ABNORMAL HIGH (ref 70–99)
Glucose-Capillary: 199 mg/dL — ABNORMAL HIGH (ref 70–99)

## 2024-05-29 NOTE — Care Management Important Message (Signed)
 Important Message  Patient Details  Name: Kelly Dunn MRN: [id number] Date of Birth: Nov 29, 1945   Important Message Given:  Yes - Medicare IM     Vonzell Arrie Sharps 05/29/2024, 10:54 AM

## 2024-05-29 NOTE — Plan of Care (Signed)

## 2024-05-29 NOTE — TOC Progression Note (Signed)
 Transition of Care [facility]) - Progression Note    Patient Details  Name: Lenon Kuennen MRN: [id number] Date of Birth: Feb 24, 1946  Transition of Care [facility]) CM/SW Contact  Isaiah Public, LCSWA Phone Number: 05/29/2024, 10:32 AM  Clinical Narrative:     CSW checked on status of bed availability with [facility]. Kia informed CSW unable to accept patient today. No bed availability today. Kia request for CSW to check back tomorrow if patient medically ready. CSW informed MD.CSW will continue to follow and assist with patients dc planning needs.  Expected Discharge Plan: Skilled Nursing Facility Barriers to Discharge: Continued Medical Work up               Expected Discharge Plan and Services In-house Referral: Clinical Social Work     Living arrangements for the past 2 months:  (from home alone, recently at [facility])                                       Social Drivers of Health (SDOH) Interventions SDOH Screenings   Food Insecurity: No Food Insecurity (05/23/2024)  Housing: Low Risk (05/23/2024)  Transportation Needs: No Transportation Needs (05/23/2024)  Utilities: Not At Risk (05/23/2024)  Social Connections: Socially Isolated (05/23/2024)  Tobacco Use: Medium Risk (05/22/2024)    Readmission Risk Interventions     No data to display

## 2024-05-29 NOTE — Progress Notes (Signed)
 Physical Therapy Treatment Patient Details Name: Kelly Dunn MRN: [id number] DOB: December 17, 1945 Today's Date: 05/29/2024   History of Present Illness 79 y.o. male admitted 05/22/24 after unresponsive episode in HD; possible seizure vs convulsive syncope in ED. Brain MRI and EEG unremarkable. Workup for postural dizziness with presyncope; also found to have acute R IJ DVT. Of note, recent admission 04/28/24 for uremic encephalopathy; HD initiated 12/6. Other PMH includes ESRD on HD, DM, HTN, HLD, gout, neuropathy.    PT Comments  Pt progressing with mobility. Today's session focused on transfer training, BLE strengthening and improving standing tolerance; pt requiring intermittent minA for mobility, continues to have orthostatic hypotension (reports asymptomatic since presyncopal event that led to admission). Pt remains limited by hypotension, generalized weakness, decreased activity tolerance, and impaired balance strategies/postural reactions. Continue to recommend post-acute rehab (< 3 hrs/day) to maximize functional mobility and independence prior to return home.  Sitting BP 138/62  Standing BP 109/73 Standing ~3-min BP 88/47     If plan is discharge home, recommend the following: A little help with walking and/or transfers;A little help with bathing/dressing/bathroom;Assistance with cooking/housework;Assist for transportation;Help with stairs or ramp for entrance   Can travel by private vehicle     Yes  Equipment Recommendations  None recommended by PT    Recommendations for Other Services  Mobility Specialist     Precautions / Restrictions Precautions Precautions: Fall;Other (comment) Recall of Precautions/Restrictions: Intact Precaution/Restrictions Comments: asymptomatic orthostatic hypotension Restrictions Weight Bearing Restrictions Per Provider Order: No     Mobility  Bed Mobility Overal bed mobility: Modified Independent Bed Mobility: Supine to Sit                 Transfers Overall transfer level: Needs assistance Equipment used: Rolling walker (2 wheels) Transfers: Sit to/from Stand Sit to Stand: Min assist, Contact guard assist           General transfer comment: heavy reliance on momentum to power up from sitting on EOB, minA for walker management as pt pulling up on it and bracing BLEs against bed for trunk elevation. repeated sit<>stands from recliner to RW, CGA for balance    Ambulation/Gait Ambulation/Gait assistance: Contact guard assist Gait Distance (Feet): 8 Feet Assistive device: Rolling walker (2 wheels) Gait Pattern/deviations: Step-through pattern, Decreased stride length, Trunk flexed Gait velocity: Decreased     General Gait Details: slow, mildly unsteady gait leaning heavy on RW, CGA for balance; further distance limited by increased fatigue after multiple bouts of transfers/therex   Stairs             Wheelchair Mobility     Tilt Bed    Modified Rankin (Stroke Patients Only)       Balance Overall balance assessment: Needs assistance Sitting-balance support: No upper extremity supported, Feet supported Sitting balance-Leahy Scale: Good     Standing balance support: No upper extremity supported, During functional activity Standing balance-Leahy Scale: Fair Standing balance comment: can stand without UE support, unable to accept significant challenge                            Communication Communication Communication: No apparent difficulties (speaking softly at times requiring cues to speak up)  Cognition Arousal: Alert Behavior During Therapy: [facility] for tasks assessed/performed, Flat affect   PT - Cognitive impairments: No family/caregiver present to determine baseline, Awareness, Memory  PT - Cognition Comments: WFL for majority of tasks; suspect overall delayed processing and decreased awareness (after session, could not recall for RN that it was PT  working with him who stripped the bed) Following commands: Intact      Cueing Cueing Techniques: Verbal cues  Exercises Other Exercises Other Exercises: 5x repeated sit<>stands (reliant on UE support), standing calf raises with BUE support, standing hip flexion; seated and standing rest breaks to check BP and pt c/o fatigue    General Comments General comments (skin integrity, edema, etc.): intermittent asterixis-like(?) movements during session. in room BP cuff and line broken, therefore dynamap used with regular sized cuff. sitting BP 138/62, standing BP 109/73, standing after 5x sit<>stands BP 92/55, standing after marching in place ~30-sec BP 88/47      Pertinent Vitals/Pain Pain Assessment Pain Assessment: No/denies pain Pain Intervention(s): Monitored during session    Home Living                          Prior Function            PT Goals (current goals can now be found in the care plan section) Progress towards PT goals: Progressing toward goals    Frequency    Min 2X/week      PT Plan      Co-evaluation              AM-PAC PT 6 Clicks Mobility   Outcome Measure  Help needed turning from your back to your side while in a flat bed without using bedrails?: A Little Help needed moving from lying on your back to sitting on the side of a flat bed without using bedrails?: A Little Help needed moving to and from a bed to a chair (including a wheelchair)?: A Little Help needed standing up from a chair using your arms (e.g., wheelchair or bedside chair)?: A Little Help needed to walk in hospital room?: A Little Help needed climbing 3-5 steps with a railing? : Total 6 Click Score: 16    End of Session Equipment Utilized During Treatment: Gait belt Activity Tolerance: Patient tolerated treatment well Patient left: in chair;with call bell/phone within reach;with chair alarm set Nurse Communication: Mobility status PT Visit Diagnosis: Unsteadiness  on feet (R26.81);Other abnormalities of gait and mobility (R26.89);Muscle weakness (generalized) (M62.81);Difficulty in walking, not elsewhere classified (R26.2)     Time: 8943-8874 PT Time Calculation (min) (ACUTE ONLY): 29 min  Charges:    $Therapeutic Exercise: 8-22 mins $Therapeutic Activity: 8-22 mins PT General Charges $$ ACUTE PT VISIT: 1 Visit                     Kelly Dunn, PT, DPT Acute Rehabilitation Services  Personal: Secure Chat Rehab Office: [phone]  Antwon Rochin L Tod Abrahamsen 05/29/2024, 1:28 PM

## 2024-05-29 NOTE — Progress Notes (Signed)
 " [facility] Progress Note   Subjective:   Seen in room - working with PT. Still with slight orthostatic BP drops, but less symptomatic. No CP/dyspnea.  Objective Vitals:   05/29/24 0017 05/29/24 0532 05/29/24 0736 05/29/24 1152  BP: (!) 138/44 (!) 156/44 (!) 153/55 (!) 124/59  Pulse: 74 61 71 80  Resp: 17 17 18 18   Temp: 98.5 F (36.9 C) 98.5 F (36.9 C) 98.4 F (36.9 C) 98.1 F (36.7 C)  TempSrc: Oral Oral Oral Oral  SpO2: 99% 98% 97% 98%  Weight:  92.2 kg    Height:       Physical Exam General: Well appearing, NAD. Room air Heart: RRR Lungs: CTA anteriorly Abdomen: soft Extremities: no LE edema Dialysis Access:  TDC + LUE AVF  Additional Objective Labs: Basic Metabolic Panel: Recent Labs  Lab 05/27/24 0112 05/28/24 0433 05/29/24 0513  NA 137 137 139  K 4.5 4.6 5.0  CL 98 98 100  CO2 29 30 29   GLUCOSE 158* 86 97  BUN 52* 31* 54*  CREATININE 7.26* 4.85* 6.79*  CALCIUM  9.2 9.6 9.7  PHOS 4.6 3.8 4.3   Liver Function Tests: Recent Labs  Lab 05/22/24 1347 05/24/24 0604 05/27/24 0112 05/28/24 0433 05/29/24 0513  AST 21  --   --   --   --   ALT 18  --   --   --   --   ALKPHOS 100  --   --   --   --   BILITOT 0.6  --   --   --   --   PROT 6.8  --   --   --   --   ALBUMIN 3.9   < > 3.2* 3.6 3.5   < > = values in this interval not displayed.   CBC: Recent Labs  Lab 05/22/24 1347 05/22/24 1353 05/24/24 0604 05/25/24 0606 05/26/24 0412 05/27/24 0112 05/28/24 0433 05/29/24 0513  WBC 4.6   < > 6.1 6.1 5.2 4.5 4.6 4.7  NEUTROABS 3.9  --  3.0 2.8  --   --   --   --   HGB 10.8*   < > 9.5* 9.0* 8.8* 8.7* 9.8* 9.3*  HCT 33.7*   < > 30.0* 28.0* 27.3* 26.5* 30.1* 29.6*  MCV 100.9*   < > 102.4* 101.1* 100.4* 99.6 99.7 102.4*  PLT 101*   < > 93* 92* 93* 97* 106* 105*   < > = values in this interval not displayed.   Medications:   allopurinol   100 mg Oral Daily   apixaban   10 mg Oral BID   Followed by   NOREEN ON 06/03/2024] apixaban   5  mg Oral BID   atorvastatin   40 mg Oral Daily   calcitRIOL   0.25 mcg Oral Once per day on Monday Wednesday Friday   Chlorhexidine  Gluconate Cloth  6 each Topical Q0600   Chlorhexidine  Gluconate Cloth  6 each Topical Q0600   cholecalciferol   5,000 Units Oral Daily   darbepoetin (ARANESP ) injection - DIALYSIS  40 mcg Subcutaneous Q Fri-1800   feeding supplement (NEPRO CARB STEADY)  237 mL Oral BID BM   ferrous sulfate   325 mg Oral Daily   insulin  aspart  0-6 Units Subcutaneous TID WC   melatonin  5 mg Oral QHS   multivitamin with minerals  1 tablet Oral Daily   senna-docusate  1 tablet Oral QHS   sodium chloride  flush  3 mL Intravenous Q12H  Dialysis Orders TTS - [facility] (1st HD 12/6 at [facility], only 3 outpatient HD so far) 4hr, 400/600, EDW 91kg, 2K/2Ca, TDC + AVF, heparin  2500 unit bolus - Getting course of Venofer - Calcitriol  0.25mcg PO q HD   Assessment/Plan:  Syncopal event at HD: Improved with IVF, likely hypotensive in nature. BP meds reduced as below.   Elevated troponin: Felt not ACS by cardiology  New seizure-like activity - AM 12/30: MRI without acute findings, EEG without seizure.   ESRD: Back to usual TTS schedule - next HD 1/6  Hypertension/volume: Amlodipine /MTP stopped. Was on midodrine  temporarily, has now been stopped.  Anemia: Hgb 9-10 range - continue Aranesp  40mcg q Friday this admit, on PO iron.  Metabolic bone disease: Ca/Phos ok, not on binders yet. Continue renal diet.   R internal jugular DVT: Acute DVT, near his TDC. Used AVF 1/3, if able to use again next HD can likely remove TDC. Now transitioned off IV heparin  to Eliquis .  Dispo: Ok from renal standpoint once cleared medically. Appears will be going to SNF/[facility].   Izetta Boehringer, PA-C 05/29/2024, 12:30 PM  [facility]    "

## 2024-05-29 NOTE — Progress Notes (Signed)
 " Progress Note    Kelly Dunn   FMW:[id number]  DOB: Jan 04, 1946  DOA: 05/22/2024     5 PCP: Regino Slater, MD  Initial CC: unresponsive   Hospital Course: Kelly Dunn is a 79 year old male with a significant history of ESRD (initiated HD 04/29/2024), Type 1 Diabetes, HTN, HLD, gout, and neuropathy. He was recently hospitalized (04/28/24) for uremic encephalopathy, which prompted the start of chronic hemodialysis.  During dialysis prior to admission he became transiently altered and briefly hypoxic.  Dialysis was aborted and he received small fluid bolus with return to baseline mentation.  Interval History:  No events overnight.  No bed available at [facility]. Worked with therapy today with still some orthostasis but drop not as severe. Next dialysis session tentatively for tomorrow. Remains medically stable for discharge once bed available at [facility]  Assessment and Plan: * Postural dizziness with presyncope - Brief altered mental status during hemodialysis - suspect component of convulsive syncope as etiology of admission - still orthostatic 12/30 when checked also - started on midodrine  with HD on 12/31 due to ongoing dizziness with transfers and hypotension in general - BP regimen further adjusted.  Off all antihypertensives at this time and midodrine  also being held for now; pressure currently holding steady - tolerated HD on 1/3 with no midodrine    Altered mental status-resolved as of 05/24/2024 - Strongly suspect hypotension contributed based off now seeing all the low BP issues since admission too  - Orthostatics very positive when checked on 12/30 also - now back to normal mentation  DVT (deep venous thrombosis) (HCC) - Carotid ultrasound ordered on admission for syncope workup; found to have acute right IJ DVT Concurrently has right PermCath, likely precipitating cause - discussed with nephrology as well; will attempt HD via left arm fistula on 1/3  (s/p fix on 12/8 with [facility]). He was able to complete this successfully  - Hopeful for PermCath to be removed outpatient soon after discharge once dialysis center comfortable with dialysis via his fistula - okay to transition heparin  to Eliquis  on 1/3  ESRD (end stage renal disease) (HCC) - Nephrology following - Dialysis per nephrology -BP medications have been all held at this time - tolerated HD on 1/3 with no hypoTN and no midodrine  needed - also tolerated via LUE fistula - eventual outpt removal of permcath; defer timing to nephrology  DM (diabetes mellitus), type 2 (HCC) - A1c 4.3 on 04/28/24 -On last admission long-acting insulin  Lantus  has been discontinued -Continue checking CBG q. ACHS, SSI coverage  Hypokalemia - replete as needed   Elevated troponin Likely ischemic demand; combo of hypotension and HD - Appreciate cardiology evaluation as well -Echo obtained, EF 45 to 50%, global hypokinesis, grade 1 DD  Diabetic neuropathy (HCC) - Gabapentin held last admission due to confusion and encephalopathy  History of gout - On allopurinol   HLD (hyperlipidemia) - on statin    Antimicrobials:   DVT prophylaxis:  SCDs Start: 05/22/24 1533 apixaban  (ELIQUIS ) tablet 10 mg  apixaban  (ELIQUIS ) tablet 5 mg   Code Status:   Code Status: Full Code  Mobility Assessment (Last 72 Hours)     Mobility Assessment     Row Name 05/29/24 1327 05/29/24 0800 05/28/24 2000 05/28/24 0723 05/27/24 2000   Does the patient have exclusion criteria? -- No- Perform mobility assessment No- Perform mobility assessment No- Perform mobility assessment No- Perform mobility assessment   What is the highest level of mobility based on the mobility assessment? Level 4 (Ambulates with  assistance) - Balance while stepping forward/back - Complete Level 3 (Stands with assistance) - Balance while standing  and cannot march in place Level 3 (Stands with assistance) - Balance while standing  and cannot march  in place Level 3 (Stands with assistance) - Balance while standing  and cannot march in place Level 3 (Stands with assistance) - Balance while standing  and cannot march in place   Is the above level different from baseline mobility prior to current illness? -- Yes - Recommend PT order Yes - Recommend PT order Yes - Recommend PT order Yes - Recommend PT order    Row Name 05/27/24 1400 05/27/24 0800 05/27/24 0001 05/26/24 2000 05/26/24 1400   Does the patient have exclusion criteria? -- No- Perform mobility assessment No- Perform mobility assessment No- Perform mobility assessment --   What is the highest level of mobility based on the mobility assessment? Level 4 (Ambulates with assistance) - Balance while stepping forward/back - Complete Level 1 (Bedfast) - Unable to balance while sitting on edge of bed Level 3 (Stands with assistance) - Balance while standing  and cannot march in place Level 3 (Stands with assistance) - Balance while standing  and cannot march in place Level 4 (Ambulates with assistance) - Balance while stepping forward/back - Complete   Is the above level different from baseline mobility prior to current illness? Yes - Recommend PT order Yes - Recommend PT order Yes - Recommend PT order Yes - Recommend PT order --      Diet: Diet Orders (From admission, onward)     Start     Ordered   05/24/24 1550  Diet renal with fluid restriction Fluid restriction: 1200 mL Fluid; Room service appropriate? Yes; Fluid consistency: Thin  Diet effective now       Question Answer Comment  Fluid restriction: 1200 mL Fluid   Room service appropriate? Yes   Fluid consistency: Thin      05/24/24 1550            Barriers to discharge: None PT Follow up Rec: Skilled Nursing-Short Term Rehab (<3 Hours/Day)05/29/2024 1327 Disposition Plan: SNF [facility]), once bed avail  HH orders placed: TBD Status is: Inpatient  Objective: Blood pressure (!) 124/59, pulse 80, temperature 98.1 F (36.7 C),  temperature source Oral, resp. rate 18, height 5' 9 (1.753 m), weight 92.2 kg, SpO2 98%.  Examination:  Physical Exam Constitutional:      General: He is not in acute distress.    Appearance: Normal appearance.  HENT:     Head: Normocephalic and atraumatic.     Mouth/Throat:     Mouth: Mucous membranes are moist.  Eyes:     Extraocular Movements: Extraocular movements intact.  Cardiovascular:     Rate and Rhythm: Normal rate and regular rhythm.  Pulmonary:     Effort: Pulmonary effort is normal. No respiratory distress.     Breath sounds: Normal breath sounds. No wheezing.  Abdominal:     General: Bowel sounds are normal. There is no distension.     Palpations: Abdomen is soft.     Tenderness: There is no abdominal tenderness.  Musculoskeletal:        General: Normal range of motion.     Cervical back: Normal range of motion and neck supple.  Skin:    General: Skin is warm and dry.  Neurological:     General: No focal deficit present.     Mental Status: He is alert.  Psychiatric:  Mood and Affect: Mood normal.        Behavior: Behavior normal.      Consultants:  Cardiology Nephrology  Procedures:    Data Reviewed: Results for orders placed or performed during the hospital encounter of 05/22/24 (from the past 24 hours)  Glucose, capillary     Status: Abnormal   Collection Time: 05/28/24  4:20 PM  Result Value Ref Range   Glucose-Capillary 122 (H) 70 - 99 mg/dL  Glucose, capillary     Status: Abnormal   Collection Time: 05/28/24  9:54 PM  Result Value Ref Range   Glucose-Capillary 147 (H) 70 - 99 mg/dL  CBC     Status: Abnormal   Collection Time: 05/29/24  5:13 AM  Result Value Ref Range   WBC 4.7 4.0 - 10.5 K/uL   RBC 2.89 (L) 4.22 - 5.81 MIL/uL   Hemoglobin 9.3 (L) 13.0 - 17.0 g/dL   HCT 70.3 (L) 60.9 - 47.9 %   MCV 102.4 (H) 80.0 - 100.0 fL   MCH 32.2 26.0 - 34.0 pg   MCHC 31.4 30.0 - 36.0 g/dL   RDW 86.4 88.4 - 84.4 %   Platelets 105 (L) 150 -  400 K/uL   nRBC 0.0 0.0 - 0.2 %  Renal function panel     Status: Abnormal   Collection Time: 05/29/24  5:13 AM  Result Value Ref Range   Sodium 139 135 - 145 mmol/L   Potassium 5.0 3.5 - 5.1 mmol/L   Chloride 100 98 - 111 mmol/L   CO2 29 22 - 32 mmol/L   Glucose, Bld 97 70 - 99 mg/dL   BUN 54 (H) 8 - 23 mg/dL   Creatinine, Ser 3.20 (H) 0.61 - 1.24 mg/dL   Calcium  9.7 8.9 - 10.3 mg/dL   Phosphorus 4.3 2.5 - 4.6 mg/dL   Albumin 3.5 3.5 - 5.0 g/dL   GFR, Estimated 8 (L) >60 mL/min   Anion gap 10 5 - 15  Glucose, capillary     Status: Abnormal   Collection Time: 05/29/24  7:40 AM  Result Value Ref Range   Glucose-Capillary 113 (H) 70 - 99 mg/dL  Glucose, capillary     Status: Abnormal   Collection Time: 05/29/24 11:55 AM  Result Value Ref Range   Glucose-Capillary 128 (H) 70 - 99 mg/dL    I have reviewed pertinent nursing notes, vitals, labs, and images as necessary. I have ordered labwork to follow up on as indicated.  I have reviewed the last notes from staff over past 24 hours. I have discussed patient's care plan and test results with nursing staff, CM/SW, and other staff as appropriate.  Old records reviewed in assessment of this patient    LOS: 5 days   Alm Apo, MD [facility] 05/29/2024, 1:37 PM "

## 2024-05-30 LAB — RENAL FUNCTION PANEL
Albumin: 3.4 g/dL — ABNORMAL LOW (ref 3.5–5.0)
Anion gap: 12 (ref 5–15)
BUN: 71 mg/dL — ABNORMAL HIGH (ref 8–23)
CO2: 28 mmol/L (ref 22–32)
Calcium: 9.9 mg/dL (ref 8.9–10.3)
Chloride: 102 mmol/L (ref 98–111)
Creatinine, Ser: 7.95 mg/dL — ABNORMAL HIGH (ref 0.61–1.24)
GFR, Estimated: 6 mL/min — ABNORMAL LOW
Glucose, Bld: 100 mg/dL — ABNORMAL HIGH (ref 70–99)
Phosphorus: 5.3 mg/dL — ABNORMAL HIGH (ref 2.5–4.6)
Potassium: 5.5 mmol/L — ABNORMAL HIGH (ref 3.5–5.1)
Sodium: 141 mmol/L (ref 135–145)

## 2024-05-30 LAB — GLUCOSE, CAPILLARY
Glucose-Capillary: 139 mg/dL — ABNORMAL HIGH (ref 70–99)
Glucose-Capillary: 160 mg/dL — ABNORMAL HIGH (ref 70–99)
Glucose-Capillary: 120 mg/dL — ABNORMAL HIGH (ref 70–99)

## 2024-05-30 LAB — CBC
HCT: 29.1 % — ABNORMAL LOW (ref 39.0–52.0)
Hemoglobin: 9.1 g/dL — ABNORMAL LOW (ref 13.0–17.0)
MCH: 31.9 pg (ref 26.0–34.0)
MCHC: 31.3 g/dL (ref 30.0–36.0)
MCV: 102.1 fL — ABNORMAL HIGH (ref 80.0–100.0)
Platelets: 107 K/uL — ABNORMAL LOW (ref 150–400)
RBC: 2.85 MIL/uL — ABNORMAL LOW (ref 4.22–5.81)
RDW: 13.6 % (ref 11.5–15.5)
WBC: 4.7 K/uL (ref 4.0–10.5)
nRBC: 0 % (ref 0.0–0.2)

## 2024-05-30 MED ORDER — APIXABAN 5 MG PO TABS: ORAL_TABLET | ORAL | Status: AC

## 2024-05-30 MED ORDER — VITAMIN D3 125 MCG (5000 UT) PO CAPS: 5000.0000 [IU] | ORAL_CAPSULE | Freq: Every day | ORAL | Status: AC

## 2024-05-30 NOTE — Plan of Care (Signed)

## 2024-05-30 NOTE — Discharge Summary (Signed)
 " Physician Discharge Summary   Kelly Dunn FMW:[id number] DOB: 10/16/1945 DOA: 05/22/2024  PCP: Regino Slater, Dunn  Admit date: 05/22/2024 Discharge date: 05/30/2024  Admitted From: Home Disposition:  [facility] Discharging physician: Alm Apo, Dunn Barriers to discharge: none  Recommendations at discharge: Remove right perm cath as soon as possible per nephrology timing Continue Eliquis  for 3 months; loading dose until 1/10, then on 1/11 to start 5 mg BID Monitor BP tolerance on HD; might require midodrine  in future if hypoTN or recurrent syncope with sessions   Discharge Condition: stable CODE STATUS: DNR Diet recommendation:  Diet Orders (From admission, onward)     Start     Ordered   05/24/24 1550  Diet renal with fluid restriction Fluid restriction: 1200 mL Fluid; Room service appropriate? Yes; Fluid consistency: Thin  Diet effective now       Question Answer Comment  Fluid restriction: 1200 mL Fluid   Room service appropriate? Yes   Fluid consistency: Thin      05/24/24 1550            Hospital Course: Kelly Dunn is a 79 year old male with a significant history of ESRD (initiated HD 04/29/2024), Type 1 Diabetes, HTN, HLD, gout, and neuropathy. He was recently hospitalized (04/28/24) for uremic encephalopathy, which prompted the start of chronic hemodialysis.  During dialysis prior to admission he became transiently altered and briefly hypoxic.  Dialysis was aborted and he received small fluid bolus with return to baseline mentation.  Assessment and Plan: * Postural dizziness with presyncope - Brief altered mental status during hemodialysis - suspect component of convulsive syncope as etiology of admission - still orthostatic 12/30 when checked also - started on midodrine  with HD on 12/31 due to ongoing dizziness with transfers and hypotension in general - BP regimen further adjusted.  Off all antihypertensives at this time and midodrine  also being held  for now; pressure currently holding steady - tolerated HD on 1/3 with no midodrine   - repeat session 1/6 planned prior to discharge also   Altered mental status-resolved as of 05/24/2024 - Strongly suspect hypotension contributed based off now seeing all the low BP issues since admission too  - Orthostatics very positive when checked on 12/30 also - now back to normal mentation  DVT (deep venous thrombosis) (HCC) - Carotid ultrasound ordered on admission for syncope workup; found to have acute right IJ DVT Concurrently has right PermCath, likely precipitating cause - discussed with nephrology as well; will attempt HD via left arm fistula on 1/3 (s/p fix on 12/8 with VVS). He was able to complete this successfully  - Hopeful for PermCath to be removed outpatient soon after discharge once dialysis center comfortable with dialysis via his fistula - okay to transition heparin  to Eliquis  on 1/3 - loading dose Eliquis  until 1/10, then on 1/11 can transition to 5 mg BID  ESRD (end stage renal disease) (HCC) - Nephrology following - Dialysis per nephrology -BP medications have been all held at this time - tolerated HD on 1/3 with no hypoTN and no midodrine  needed - also tolerated via LUE fistula - eventual outpt removal of permcath; defer timing to nephrology  DM (diabetes mellitus), type 2 (HCC) - A1c 4.3 on 04/28/24 -On last admission long-acting insulin  Lantus  has been discontinued - diet controlled   Hypokalemia - repleted  Elevated troponin Likely ischemic demand; combo of hypotension and HD - Appreciate cardiology evaluation as well -Echo obtained, EF 45 to 50%, global hypokinesis, grade 1 DD  Diabetic  neuropathy (HCC) - Gabapentin held last admission due to confusion and encephalopathy  History of gout - On allopurinol   HLD (hyperlipidemia) - on statin    The patient's acute and chronic medical conditions were treated accordingly. On day of discharge, patient was felt  deemed stable for discharge. Patient/family member advised to call PCP or come back to ER if needed.   Principal Diagnosis: Postural dizziness with presyncope  Discharge Diagnoses: Active Hospital Problems   Diagnosis Date Noted   Postural dizziness with presyncope 05/22/2024    Priority: 2.   DVT (deep venous thrombosis) (HCC) 05/24/2024    Priority: 3.   ESRD (end stage renal disease) (HCC) 05/22/2024    Priority: 3.   DM (diabetes mellitus), type 2 (HCC) 04/28/2024    Priority: 5.   Hypokalemia 05/22/2024    Priority: 6.   Elevated troponin 05/22/2024    Priority: 7.   Pressure injury of skin 05/24/2024   Orthostatic syncope 05/24/2024   HLD (hyperlipidemia) 05/23/2024   History of gout 05/23/2024   Diabetic neuropathy (HCC) 05/23/2024   Chronic gouty arthritis 08/17/2020   Essential hypertension 08/17/2020   Pure hypercholesterolemia 08/17/2020    Resolved Hospital Problems   Diagnosis Date Noted Date Resolved   Altered mental status 04/28/2024 05/24/2024    Priority: 1.   Syncope and collapse 05/22/2024 05/25/2024     Discharge Instructions     Increase activity slowly   Complete by: As directed    No wound care   Complete by: As directed       Allergies as of 05/30/2024       Reactions   Nsaids Other (See Comments)   Contraindication due to CKD    Sertraline  Other (See Comments)   Hallucinations         Medication List     PAUSE taking these medications    aspirin  81 MG tablet Wait to take this until your doctor or other care provider tells you to start again. Resume once no longer on Eliquis   Take 81 mg by mouth daily.       STOP taking these medications    oxyCODONE  5 MG immediate release tablet Commonly known as: Oxy IR/ROXICODONE        TAKE these medications    acetaminophen  325 MG tablet Commonly known as: TYLENOL  Take 2 tablets (650 mg total) by mouth every 6 (six) hours as needed for mild pain (pain score 1-3) (or Fever >/=  101).   allopurinol  100 MG tablet Commonly known as: ZYLOPRIM  Take 1 tablet (100 mg total) by mouth daily.   amLODipine  10 MG tablet Commonly known as: NORVASC  Take 10 mg by mouth daily.   apixaban  5 MG Tabs tablet Commonly known as: ELIQUIS  Take 2 tablets (10 mg total) by mouth 2 (two) times daily for 5 days, THEN 1 tablet (5 mg total) 2 (two) times daily. Start taking on: May 30, 2024   atorvastatin  40 MG tablet Commonly known as: LIPITOR Take 40 mg by mouth daily.   bisacodyl  5 MG EC tablet Commonly known as: DULCOLAX Take 1 tablet (5 mg total) by mouth daily as needed for moderate constipation.   calcitRIOL  0.25 MCG capsule Commonly known as: ROCALTROL  Take 0.25 mcg by mouth every Monday, Wednesday, and Friday.   carvedilol  25 MG tablet Commonly known as: COREG  Take 25 mg by mouth daily at 12 noon.   FeroSul 325 (65 Fe) MG tablet Generic drug: ferrous sulfate  Take 325 mg by mouth daily.  ipratropium 0.02 % nebulizer solution Commonly known as: ATROVENT  Take 2.5 mLs (0.5 mg total) by nebulization every 6 (six) hours as needed for wheezing.   multivitamin with minerals Tabs tablet Take 1 tablet by mouth daily.   senna-docusate 8.6-50 MG tablet Commonly known as: Senokot-S Take 1 tablet by mouth at bedtime as needed for mild constipation.   sertraline  25 MG tablet Commonly known as: ZOLOFT  Take 25 mg by mouth daily.   Vitamin D3 125 MCG (5000 UT) Caps Take 1 capsule (5,000 Units total) by mouth daily.        Contact information for after-discharge care     Destination     [facility] .   Service: Skilled Nursing Contact information: [address] [place]   [address] [phone]                    Allergies[1]  Consultations: Nephrology Cardiology  Procedures:   Discharge Exam: BP (!) 142/53 (BP Location: Right Arm)   Pulse 69   Temp 97.7 F (36.5 C) (Oral)   Resp 18   Ht 5' 9 (1.753 m)   Wt 90  kg   SpO2 99%   BMI 29.31 kg/m  Physical Exam Constitutional:      General: He is not in acute distress.    Appearance: Normal appearance.  HENT:     Head: Normocephalic and atraumatic.     Mouth/Throat:     Mouth: Mucous membranes are moist.  Eyes:     Extraocular Movements: Extraocular movements intact.  Cardiovascular:     Rate and Rhythm: Normal rate and regular rhythm.  Pulmonary:     Effort: Pulmonary effort is normal. No respiratory distress.     Breath sounds: Normal breath sounds. No wheezing.  Abdominal:     General: Bowel sounds are normal. There is no distension.     Palpations: Abdomen is soft.     Tenderness: There is no abdominal tenderness.  Musculoskeletal:        General: Normal range of motion.     Cervical back: Normal range of motion and neck supple.  Skin:    General: Skin is warm and dry.  Neurological:     General: No focal deficit present.     Mental Status: He is alert.  Psychiatric:        Mood and Affect: Mood normal.        Behavior: Behavior normal.      The results of significant diagnostics from this hospitalization (including imaging, microbiology, ancillary and laboratory) are listed below for reference.   Microbiology: No results found for this or any previous visit (from the past 240 hours).   Labs: BNP (last 3 results) No results for input(s): BNP in the last 8760 hours. Basic Metabolic Panel: Recent Labs  Lab 05/24/24 0604 05/25/24 1203 05/26/24 0412 05/27/24 0112 05/28/24 0433 05/29/24 0513 05/30/24 0429  NA 138   < > 137 137 137 139 141  K 4.8   < > 4.3 4.5 4.6 5.0 5.5*  CL 99   < > 97* 98 98 100 102  CO2 26   < > 28 29 30 29 28   GLUCOSE 83   < > 88 158* 86 97 100*  BUN 47*   < > 39* 52* 31* 54* 71*  CREATININE 7.20*   < > 6.15* 7.26* 4.85* 6.79* 7.95*  CALCIUM  8.9   < > 9.3 9.2 9.6 9.7 9.9  MG 2.3  --  2.1  --   --   --   --   PHOS 5.8*   < > 4.4 4.6 3.8 4.3 5.3*   < > = values in this interval not displayed.    Liver Function Tests: Recent Labs  Lab 05/26/24 0412 05/27/24 0112 05/28/24 0433 05/29/24 0513 05/30/24 0429  ALBUMIN 3.5 3.2* 3.6 3.5 3.4*   No results for input(s): LIPASE, AMYLASE in the last 168 hours. No results for input(s): AMMONIA in the last 168 hours. CBC: Recent Labs  Lab 05/24/24 0604 05/25/24 0606 05/26/24 0412 05/27/24 0112 05/28/24 0433 05/29/24 0513 05/30/24 0429  WBC 6.1 6.1 5.2 4.5 4.6 4.7 4.7  NEUTROABS 3.0 2.8  --   --   --   --   --   HGB 9.5* 9.0* 8.8* 8.7* 9.8* 9.3* 9.1*  HCT 30.0* 28.0* 27.3* 26.5* 30.1* 29.6* 29.1*  MCV 102.4* 101.1* 100.4* 99.6 99.7 102.4* 102.1*  PLT 93* 92* 93* 97* 106* 105* 107*   Cardiac Enzymes: No results for input(s): CKTOTAL, CKMB, CKMBINDEX, TROPONINI in the last 168 hours. BNP: Invalid input(s): POCBNP CBG: Recent Labs  Lab 05/29/24 0740 05/29/24 1155 05/29/24 1616 05/29/24 2047 05/30/24 0803  GLUCAP 113* 128* 147* 199* 139*   D-Dimer No results for input(s): DDIMER in the last 72 hours. Hgb A1c No results for input(s): HGBA1C in the last 72 hours. Lipid Profile No results for input(s): CHOL, HDL, LDLCALC, TRIG, CHOLHDL, LDLDIRECT in the last 72 hours. Thyroid function studies No results for input(s): TSH, T4TOTAL, T3FREE, THYROIDAB in the last 72 hours.  Invalid input(s): FREET3 Anemia work up No results for input(s): VITAMINB12, FOLATE, FERRITIN, TIBC, IRON, RETICCTPCT in the last 72 hours. Urinalysis    Component Value Date/Time   COLORURINE YELLOW 04/28/2024 1348   APPEARANCEUR CLEAR 04/28/2024 1348   LABSPEC 1.020 04/28/2024 1348   PHURINE 6.0 04/28/2024 1348   GLUCOSEU NEGATIVE 04/28/2024 1348   HGBUR SMALL (A) 04/28/2024 1348   BILIRUBINUR NEGATIVE 04/28/2024 1348   KETONESUR NEGATIVE 04/28/2024 1348   PROTEINUR 100 (A) 04/28/2024 1348   NITRITE NEGATIVE 04/28/2024 1348   LEUKOCYTESUR NEGATIVE 04/28/2024 1348   Sepsis Labs Recent  Labs  Lab 05/27/24 0112 05/28/24 0433 05/29/24 0513 05/30/24 0429  WBC 4.5 4.6 4.7 4.7   Microbiology No results found for this or any previous visit (from the past 240 hours).  Procedures/Studies: IR Repair Tun/Non Tun CV Cath W/O FL Result Date: 05/24/2024 CLINICAL DATA:  79 year old male. History of end-stage renal disease on hemodialysis via a right IJ tunneled catheter placed by surgery in May 04, 2024. Patient found to have a crack in the arterial port. EXAM: EXTERNAL REPAIR CV CATH COMPARISON:  None Available. CONTRAST:  None-administered via the existing percutaneous drain. FLUOROSCOPY TIME:  None TECHNIQUE: All questions were addressed. Using sterile barrier technique was utilized using sterile gloves, hand hygiene and skin antiseptic. A time-out was performed prior to the initiation of the procedure The right chest was prepped and draped in a sterile fashion. The arterial port was cut above the fracture and a new hub was placed. FINDINGS: Tunneled dialysis catheter arterial port repair. The port was able to be flushed and aspirated without incident. IMPRESSION: Successful tunneled dialysis catheter arterial port repair. Performed by Delon Beagle NP Electronically Signed   By: JONETTA Faes M.D.   On: 05/24/2024 16:50   VAS US  CAROTID Result Date: 05/24/2024 Carotid Arterial Duplex Study Patient Name:  Kelly Dunn  Date of Exam:   05/24/2024  Medical Rec #: [id number]           Accession #:    [id number] Date of Birth: Jul 27, 1945           Patient Gender: M Patient Age:   57 years Exam Location:  [facility] Procedure:      VAS US  CAROTID Referring Phys: ADRIANA GRAMS --------------------------------------------------------------------------------  Indications:       Syncope. Risk Factors:      Hypertension, hyperlipidemia, Diabetes, coronary artery                    disease, PAD. Other Factors:     05/04/24 - Right insertion of tunneled dialysis catheter -                     right chest.                    History of left subclavian stent.                    07/15/20 -Non functional left arm Brachiocephalic AVF. Comparison Study:  No priors. Performing Technologist: Ricka Sturdivant-Jones RDMS, RVT  Examination Guidelines: A complete evaluation includes B-mode imaging, spectral Doppler, color Doppler, and power Doppler as needed of all accessible portions of each vessel. Bilateral testing is considered an integral part of a complete examination. Limited examinations for reoccurring indications may be performed as noted.  Right Carotid Findings: +----------+--------+--------+--------+------------------+------------------+           PSV cm/sEDV cm/sStenosisPlaque DescriptionComments           +----------+--------+--------+--------+------------------+------------------+ CCA Prox  59      12                                intimal thickening +----------+--------+--------+--------+------------------+------------------+ CCA Mid                                             intimal thickening +----------+--------+--------+--------+------------------+------------------+ CCA Distal68      11                                intimal thickening +----------+--------+--------+--------+------------------+------------------+ ICA Prox  75      19      1-39%   calcific                             +----------+--------+--------+--------+------------------+------------------+ ICA Distal86      17                                                   +----------+--------+--------+--------+------------------+------------------+ ECA       79                                                           +----------+--------+--------+--------+------------------+------------------+ +----------+--------+-------+----------------+-------------------+           PSV  cm/sEDV cmsDescribe        Arm Pressure (mmHG)  +----------+--------+-------+----------------+-------------------+ [id number]            Multiphasic, WNL                    +----------+--------+-------+----------------+-------------------+ +---------+--------+--+--------+--+---------+ VertebralPSV cm/s37EDV cm/s11Antegrade +---------+--------+--+--------+--+---------+ Incidental finding - Right IJV thrombus. Left Carotid Findings: +----------+--------+--------+--------+------------------+------------------+           PSV cm/sEDV cm/sStenosisPlaque DescriptionComments           +----------+--------+--------+--------+------------------+------------------+ CCA Prox  82      13                                                   +----------+--------+--------+--------+------------------+------------------+ CCA Mid                                             intimal thickening +----------+--------+--------+--------+------------------+------------------+ CCA Distal65      15                                intimal thickening +----------+--------+--------+--------+------------------+------------------+ ICA Prox  54      15      1-39%                     intimal thickening +----------+--------+--------+--------+------------------+------------------+ ICA Distal95      21                                                   +----------+--------+--------+--------+------------------+------------------+ ECA       70                                                           +----------+--------+--------+--------+------------------+------------------+ +----------+--------+--------+----------------+-------------------+           PSV cm/sEDV cm/sDescribe        Arm Pressure (mmHG) +----------+--------+--------+----------------+-------------------+ [id number]             Multiphasic, WNL                    +----------+--------+--------+----------------+-------------------+  +---------+--------+--+--------+--+---------+ VertebralPSV cm/s56EDV cm/s13Antegrade +---------+--------+--+--------+--+---------+   Summary: Right Carotid: Velocities in the right ICA are consistent with a 1-39% stenosis.                 Incidental findings: acute right IJV deep vein thrombosis. The                distal subclavain vein appears patent. Left Carotid: Velocities in the left ICA are consistent with a 1-39% stenosis. Vertebrals:  Bilateral vertebral arteries demonstrate antegrade flow. Subclavians: Normal flow hemodynamics were seen in bilateral subclavian              arteries. *See table(s) above for measurements and observations.  Electronically signed by Fonda Rim on 05/24/2024 at 2:06:52 PM.    Final  ECHOCARDIOGRAM COMPLETE Result Date: 05/23/2024    ECHOCARDIOGRAM REPORT   Patient Name:   Kelly Dunn Date of Exam: 05/23/2024 Medical Rec #:  [id number]          Height:       72.0 in Accession #:    [id number]         Weight:       218.7 lb Date of Birth:  April 07, 1946          BSA:          2.213 m Patient Age:    78 years           BP:           93/43 mmHg Patient Gender: M                  HR:           64 bpm. Exam Location:  Inpatient Procedure: 2D Echo, Cardiac Doppler, Color Doppler and Intracardiac            Opacification Agent (Both Spectral and Color Flow Doppler were            utilized during procedure). Indications:    Syncope R55  History:        Patient has no prior history of Echocardiogram examinations.                 Risk Factors:Hypertension, Diabetes and Former Smoker.  Sonographer:    Merlynn Argyle Referring Phys: [phone] MARK C SKAINS  Sonographer Comments: Suboptimal subcostal window. Image acquisition challenging due to respiratory motion. IMPRESSIONS  1. Left ventricular ejection fraction, by estimation, is 45 to 50%. Left ventricular ejection fraction by 2D MOD biplane is 47.5 %. The left ventricle has mildly decreased function. The left ventricle  demonstrates global hypokinesis. The left ventricular internal cavity size was mildly dilated. There is moderate asymmetric left ventricular hypertrophy of the basal-septal segment. Left ventricular diastolic parameters are consistent with Grade I diastolic dysfunction (impaired relaxation).  2. Right ventricular systolic function is low normal. The right ventricular size is normal. Tricuspid regurgitation signal is inadequate for assessing PA pressure.  3. Left atrial size was mildly dilated.  4. Right atrial size was mildly dilated.  5. The mitral valve is normal in structure. No evidence of mitral valve regurgitation. No evidence of mitral stenosis.  6. The aortic valve is tricuspid. Aortic valve regurgitation is trivial. No aortic stenosis is present.  7. Aortic dilatation noted. There is moderate dilatation of the aortic root, measuring 46 mm. There is borderline dilatation of the ascending aorta, measuring 39 mm.  8. The inferior vena cava is normal in size with greater than 50% respiratory variability, suggesting right atrial pressure of 3 mmHg.  9. A small pericardial effusion is present. FINDINGS  Left Ventricle: Left ventricular ejection fraction, by estimation, is 45 to 50%. Left ventricular ejection fraction by 2D MOD biplane is 47.5 %. The left ventricle has mildly decreased function. The left ventricle demonstrates global hypokinesis. Definity  contrast agent was given IV to delineate the left ventricular endocardial borders. The left ventricular internal cavity size was mildly dilated. There is moderate asymmetric left ventricular hypertrophy of the basal-septal segment. Left ventricular diastolic parameters are consistent with Grade I diastolic dysfunction (impaired relaxation). Right Ventricle: The right ventricular size is normal. No increase in right ventricular wall thickness. Right ventricular systolic function is low normal. Tricuspid regurgitation signal is inadequate for assessing PA pressure.  Left Atrium: Left  atrial size was mildly dilated. Right Atrium: Right atrial size was mildly dilated. Pericardium: A small pericardial effusion is present. Mitral Valve: The mitral valve is normal in structure. No evidence of mitral valve regurgitation. No evidence of mitral valve stenosis. Tricuspid Valve: The tricuspid valve is normal in structure. Tricuspid valve regurgitation is trivial. Aortic Valve: The aortic valve is tricuspid. Aortic valve regurgitation is trivial. Aortic regurgitation PHT measures 666 msec. No aortic stenosis is present. Pulmonic Valve: The pulmonic valve was normal in structure. Pulmonic valve regurgitation is not visualized. Aorta: Aortic dilatation noted. There is moderate dilatation of the aortic root, measuring 46 mm. There is borderline dilatation of the ascending aorta, measuring 39 mm. Venous: The inferior vena cava is normal in size with greater than 50% respiratory variability, suggesting right atrial pressure of 3 mmHg. IAS/Shunts: No atrial level shunt detected by color flow Doppler.  LEFT VENTRICLE PLAX 2D                        Biplane EF (MOD) LVIDd:         6.30 cm         LV Biplane EF:   Left LVIDs:         4.50 cm                          ventricular LV PW:         0.90 cm                          ejection LV IVS:        0.70 cm                          fraction by LVOT diam:     2.60 cm                          2D MOD LV SV:         113                              biplane is LV SV Index:   51                               47.5 %. LVOT Area:     5.31 cm                                Diastology                                LV e' medial:    5.44 cm/s LV Volumes (MOD)               LV E/e' medial:  9.0 LV vol d, MOD    137.5 ml      LV e' lateral:   10.10 cm/s A2C:                           LV E/e' lateral: 4.8 LV vol d, MOD    173.0 ml A4C: LV vol  s, MOD    66.4 ml A2C: LV vol s, MOD    102.0 ml A4C: LV SV MOD A2C:   71.1 ml LV SV MOD A4C:   173.0 ml LV SV MOD BP:     73.6 ml RIGHT VENTRICLE RV Basal diam:  3.70 cm RV S prime:     10.70 cm/s TAPSE (M-mode): 1.6 cm LEFT ATRIUM             Index        RIGHT ATRIUM           Index LA diam:        4.00 cm 1.81 cm/m   RA Area:     19.20 cm LA Vol (A2C):   59.8 ml 27.02 ml/m  RA Volume:   55.30 ml  24.99 ml/m LA Vol (A4C):   69.0 ml 31.18 ml/m LA Biplane Vol: 64.8 ml 29.28 ml/m  AORTIC VALVE LVOT Vmax:   92.50 cm/s LVOT Vmean:  67.600 cm/s LVOT VTI:    0.213 m AI PHT:      666 msec  AORTA Ao Root diam: 4.60 cm Ao Asc diam:  3.90 cm MITRAL VALVE MV Area (PHT): 2.02 cm    SHUNTS MV Decel Time: 375 msec    Systemic VTI:  0.21 m MV E velocity: 48.70 cm/s  Systemic Diam: 2.60 cm MV A velocity: 91.90 cm/s MV E/A ratio:  0.53 Kelly Dunn Electronically signed by Ezra Kanner Signature Date/Time: 05/23/2024/5:01:41 PM    Final    EEG adult Result Date: 05/23/2024 Kelly Kelly KIDD, Dunn     05/23/2024 12:19 PM Patient Name: Kelly Dunn MRN: [id number] Epilepsy Attending: Arlin Dunn Kelly Referring Physician/Provider: Charlton Kelly RAMAN, Dunn Date: 05/23/2024 Duration: 23.46 mins Patient history: 79 yo M noted to become stiff and then shake for ~1 minute. He returned to baseline after ~5 minutes. EEG to evaluate for seizure Level of alertness: Awake, asleep AEDs during EEG study: None Technical aspects: This EEG study was done with scalp electrodes positioned according to the 10-20 International system of electrode placement. Electrical activity was reviewed with band pass filter of 1-70Hz , sensitivity of 7 uV/mm, display speed of 61mm/sec with a 60Hz  notched filter applied as appropriate. EEG data were recorded continuously and digitally stored.  Video monitoring was available and reviewed as appropriate. Description: The posterior dominant rhythm consists of 8-9 Hz activity of moderate voltage (25-35 uV) seen predominantly in posterior head regions, symmetric and reactive to eye opening and eye closing. Sleep was  characterized by vertex waves, sleep spindles (12 to 14 Hz), maximal frontocentral region. EEG showed continuous 3 to 6 Hz theta-delta slowing in right fronto-temporal region. Physiologic photic driving was not seen during photic stimulation. Hyperventilation was not performed.   ABNORMALITY - Continuous slow, right fronto-temporal region IMPRESSION: This study is suggestive of cortical dysfunction arising from right fronto-temporal region likely secondary to underlying structural abnormality. No seizures or epileptiform discharges were seen throughout the recording. Kelly Dunn   MR BRAIN WO CONTRAST Result Date: 05/23/2024 EXAM: MRI BRAIN WITHOUT CONTRAST 05/23/2024 03:53:00 AM TECHNIQUE: Multiplanar multisequence MRI of the head/brain was performed without the administration of intravenous contrast. COMPARISON: CT of the head dated 05/22/2024. CLINICAL HISTORY: Seizure, new-onset, no history of trauma. FINDINGS: BRAIN AND VENTRICLES: No acute infarct. No intracranial hemorrhage. No mass. No midline shift. No hydrocephalus. There is age-related cerebral volume loss and mild-to-moderate diffuse cerebral white matter disease. A prominent extraaxial fluid collection is again seen overlying  the right frontal lobe, which is isointense with CSF and has no focal mass effect upon the underlying brain parenchyma. The sella is unremarkable. Normal flow voids. ORBITS: No acute abnormality. SINUSES AND MASTOIDS: No acute abnormality. BONES AND SOFT TISSUES: Normal marrow signal. No acute soft tissue abnormality. IMPRESSION: 1. No acute intracranial abnormality to account for the new-onset seizure. 2. Age-related cerebral volume loss and mild-to-moderate diffuse cerebral white matter disease. 3. Prominent extra-axial CSF-intensity fluid collection overlying the right frontal lobe, without focal mass effect, similar to CT head 05/22/2024. Electronically signed by: Kelly Dunn 05/23/2024 04:00 AM EST RP  Workstation: [id number]   DG Chest Portable 1 View Result Date: 05/22/2024 EXAM: 1 VIEW(S) XRAY OF THE CHEST 05/22/2024 01:50:00 PM COMPARISON: 05/04/2024 CLINICAL HISTORY: chest pain FINDINGS: LINES, TUBES AND DEVICES: Right chest dialysis catheter in place. Left subclavian stent noted. LUNGS AND PLEURA: Diffusely increased interstitial prominence. No focal pulmonary airspace opacity. No pleural effusion. No pneumothorax. HEART AND MEDIASTINUM: Calcified aorta. BONES AND SOFT TISSUES: No acute osseous abnormality. IMPRESSION: 1. Increased interstitial prominence concerning interstitial edema. No significant pleural effusion or consolidative change. Electronically signed by: Waddell Calk Dunn 05/22/2024 02:50 PM EST RP Workstation: [id number]   CT Head Wo Contrast Result Date: 05/22/2024 EXAM: CT HEAD WITHOUT CONTRAST 05/22/2024 02:01:07 PM TECHNIQUE: CT of the head was performed without the administration of intravenous contrast. Automated exposure control, iterative reconstruction, and/or weight based adjustment of the mA/kV was utilized to reduce the radiation dose to as low as reasonably achievable. COMPARISON: head CT 04/28/2024 CLINICAL HISTORY: Mental status change, unknown cause FINDINGS: BRAIN AND VENTRICLES: Asymmetric low density extra-axial fluid over the right frontal convexity is unchanged, measuring up to 8 mm in thickness with associated slight mass effect on the frontal lobe and compatible with a subdural hygroma or chronic hematoma. No significant midline shift, acute intracranial hemorrhage, acute infarct, hydrocephalus, or mass is identified. There is mild cerebral atrophy. Cerebral white matter hypodensities are unchanged and nonspecific but compatible with mild chronic small vessel ischemic disease. Calcified atherosclerosis at the skull base. ORBITS: No acute abnormality. SINUSES: Minimal mucosal thickening in the paranasal sinuses. Clear mastoid air cells. SOFT TISSUES AND SKULL: No  acute soft tissue abnormality. No skull fracture. IMPRESSION: 1. No acute intracranial abnormality. 2. Unchanged small right frontal subdural hygroma or chronic subdural hematoma. 3. Mild chronic small vessel ischemic disease. Electronically signed by: Dasie Hamburg Dunn 05/22/2024 02:23 PM EST RP Workstation: [id number]   DG Chest Port 1 View Result Date: 05/04/2024 CLINICAL DATA:  Status post dialysis catheter insertion. EXAM: PORTABLE CHEST 1 VIEW COMPARISON:  Chest radiograph dated 04/28/2024 FINDINGS: Right-sided dialysis catheter with tip at the cavoatrial junction. No focal consolidation or pneumothorax. Trace left pleural effusion suspected. Mild cardiomegaly. Atherosclerotic calcification of the aortic arch. Left subclavian vascular stent. No acute osseous pathology. IMPRESSION: Right-sided dialysis catheter with tip at the cavoatrial junction. No pneumothorax. Electronically Signed   By: Vanetta Chou M.D.   On: 05/04/2024 18:24   DG C-Arm 1-60 Min-No Report Result Date: 05/04/2024 Fluoroscopy was utilized by the requesting physician.  No radiographic interpretation.   PERIPHERAL VASCULAR CATHETERIZATION Result Date: 05/01/2024 Images from the original result were not included. Patient name: Kyley Laurel MRN: [id number] DOB: 09-12-1945 Sex: male 05/01/2024 Pre-operative Diagnosis: End-stage renal disease, malfunction left arm AV fistula with infiltration Post-operative diagnosis:  Same Surgeon:  Penne BROCKS. Sheree, Dunn Procedure Performed: 1.  Percutaneous ultrasound-guided cannulation left arm AV fistula 2.  Left upper extremity fistulogram 3.  Plain balloon angioplasty mid upper arm AV fistula with 8 and 10 mm Mustang balloons 4.  Stent left cephalic arch with 10 x 40 and 10 x 60 mm epic stents postdilated with 10 mm balloon Indications: 79 year old male with history of left upper arm AV fistula which is a superficial lysed cephalic vein now with pulsatility on exam and recent infiltration.  He  is indicated for fistulogram with possible invention. Findings: In the midsegment there was approximately 80% stenosis reduced to less than 20% stenosis with 8 and 10 mm balloon angioplasty.  The cephalic arch was subtotally occluded was primarily stented with initially 10 x 40 mm epic stent which migrated centrally and this was fixed and the lesion was stented with 10 x 60 mm epic and these were postdilated with 10 mm balloon.  At completion there was a much improved thrill throughout the fistula. Fistula is okay for immediate use, if remains amenable to future endovascular intervention.  Procedure:  The patient was identified in the holding area and taken to room 8.  The patient was then placed supine on the table and prepped and draped in the usual sterile fashion.  A time out was called.  Ultrasound was used to evaluate the left arm AV fistula near the antecubitum.  The area was anesthetized 1% lidocaine  cannulated with a micropuncture needle followed by wire sheath.  Ultrasound images saved to the permanent record.  Left upper extremity fistulogram was performed including retrograde imaging with the fistula compressed.  We then placed initially Glidewire followed by 7 French sheath and perform balloon angioplasty of the mid upper arm lesion to less than 20% residual stenosis with some improvement in flow by palpation.  There did appear to be mild amount of thrombus in the upper arm fistula and there for 5000 units of heparin  was then administered.  We then used a Glidewire and Kumpe catheter across the subtotally occluded cephalic vein and performed central venogram.  Initially a Rosen followed by a V18 wire was placed into the IVC under fluoroscopic guidance.  We then began with balloon venoplasty of the cephalic arch with 8 and 10 mm balloons.  There was some concern for rupture and therefore balloon was inflated for 4 minutes at low pressure.  Completion demonstrated no rupture however the lesion remained  and required stenting.  I started with a 10 x 40 mm epic stent this unfortunately migrated centrally and I extended back with a 10 x 60 mm epic stent postdilated with 10 mm balloon.  Completion demonstrated resolution of the lesion with no residual stenosis and brisk flow by palpation.  Satisfied with this the wire and sheath were removed and the cannulation site was suture-ligated.  He tolerated the procedure without any complication. Contrast: 70cc Brandon C. Sheree, Dunn [facility] Office: [phone] Pager: [phone]     Time coordinating discharge: Over 30 minutes    Alm Apo, Dunn  [facility] 05/30/2024, 10:53 AM    [1]  Allergies Allergen Reactions   Nsaids Other (See Comments)    Contraindication due to CKD    Sertraline  Other (See Comments)    Hallucinations    "

## 2024-05-30 NOTE — Discharge Planning (Signed)
 [facility] Patient Discharge Orders - [facility] CLINIC: [facility]  Patient's name: Kelly Dunn Admit/DC Dates: 05/22/2024 - 05/30/2024  DISCHARGE DIAGNOSES: Syncopal episode Elevated troponin - felt demand ischemia Seizure like activity 12/30 - EEG without seizure, MRI ok HTN - amlod and metoprolol  stopped, used mido temporarily, now off. Acute R internal jugular DVT - on Eliquis    HD ORDER CHANGES: Heparin  change: no EDW Change: yes New EDW: 92kg Bath Change: no  ANEMIA MANAGEMENT: Aranesp : Given: yes   Amount/Date of last dose: 40mcg on 1/2 ESA dose for discharge: mircera 50 mcg IV q 2 weeks, to start on 06/02/24 IV Iron dose at discharge: per protocol Transfusion: Given: no  BONE/MINERAL MEDICATIONS: Hectorol/Calcitriol  change: no Sensipar/Parsabiv change: no  ACCESS INTERVENTION/CHANGE: yes - able to resume using L AVF - done twice successfully. If able to stick without issues on next outpatient HD, then arrange to have [facility] removed (internal jugular DVT - so cannot be done at [facility], pls have [facility] pull)  RECENT LABS: Recent Labs  Lab 05/30/24 0429  HGB 9.1*  NA 141  K 5.5*  CALCIUM  9.9  PHOS 5.3*  ALBUMIN 3.4*    IV ANTIBIOTICS: no Details:  OTHER ANTICOAGULATION: YES Details: now on Eliquis  for new DVT  OTHER/APPTS/LAB ORDERS: - D/c to SNF [facility])  D/C Meds to be reconciled by nurse after every discharge.  Completed By: Izetta Boehringer, PA-C [facility] Pager [phone]   Reviewed by: MD:______ RN_______

## 2024-05-30 NOTE — TOC Transition Note (Signed)
 Transition of Care [facility]) - Discharge Note   Patient Details  Name: Kelly Dunn MRN: [id number] Date of Birth: Sep 13, 1945  Transition of Care [facility]) CM/SW Contact:  Isaiah Public, LCSWA Phone Number: 05/30/2024, 5:04 PM   Clinical Narrative:     Patient will DC to: [facility]  Anticipated DC date: 05/30/2024  Family notified: [name]  Transport by: [facility]  ?  Per MD patient ready for DC to [facility] . RN, patient, patient's family,Tracy renal navigator, and facility notified of DC. Discharge Summary sent to facility. RN given number for report [phone] RM# [phone]. DC packet on chart. DNR signed by MD attached to patients DC packet.Ambulance transport requested for patient.  CSW signing off.   Final next level of care: Skilled Nursing Facility Barriers to Discharge: No Barriers Identified   Patient Goals and CMS Choice     Choice offered to / list presented to : Adult Children [name] and [name])      Discharge Placement              Patient chooses bed at: [facility] Patient to be transferred to facility by: [facility] Name of family member notified: [name] Patient and family notified of of transfer: 05/30/24  Discharge Plan and Services Additional resources added to the After Visit Summary for   In-house Referral: Clinical Social Work                                   Social Drivers of Health (SDOH) Interventions SDOH Screenings   Food Insecurity: No Food Insecurity (05/23/2024)  Housing: Low Risk (05/23/2024)  Transportation Needs: No Transportation Needs (05/23/2024)  Utilities: Not At Risk (05/23/2024)  Social Connections: Socially Isolated (05/23/2024)  Tobacco Use: Medium Risk (05/22/2024)     Readmission Risk Interventions     No data to display

## 2024-05-30 NOTE — Progress Notes (Signed)
 Contacted by CSW regarding plans to d/c pt to snf today after HD. Contacted inpt HD unit to request that pt be treated as early on 2nd shift as possible so pt can d/c to snf after HD. Contacted [facility] to be advised of pt's d/c today and that pt should resume care on Thursday.   Randine Mungo Dialysis Navigator [phone]

## 2024-05-30 NOTE — TOC Progression Note (Signed)
 Transition of Care [facility]) - Progression Note    Patient Details  Name: Kelly Dunn MRN: [id number] Date of Birth: 1946/02/26  Transition of Care [facility]) CM/SW Contact  Isaiah Public, LCSWA Phone Number: 05/30/2024, 3:02 PM  Clinical Narrative:     CSW spoke with Kia with [facility] who confirmed patient can dc over today if medically ready. CSW informed MD. CSW spoke with patients daughter [name] who confirmed patient will transport by [facility] when medically ready for dc. CSW will continue to follow.  Expected Discharge Plan: Skilled Nursing Facility Barriers to Discharge: Continued Medical Work up               Expected Discharge Plan and Services In-house Referral: Clinical Social Work     Living arrangements for the past 2 months:  (from home alone, recently at [facility] SNF) Expected Discharge Date: 05/30/24                                     Social Drivers of Health (SDOH) Interventions SDOH Screenings   Food Insecurity: No Food Insecurity (05/23/2024)  Housing: Low Risk (05/23/2024)  Transportation Needs: No Transportation Needs (05/23/2024)  Utilities: Not At Risk (05/23/2024)  Social Connections: Socially Isolated (05/23/2024)  Tobacco Use: Medium Risk (05/22/2024)    Readmission Risk Interventions     No data to display

## 2024-05-30 NOTE — Progress Notes (Deleted)
 1712 Attempted to call report to [facility] SNF @ [phone]. No answer, and no voicemail set up. Will continue to try to call.

## 2024-05-30 NOTE — Progress Notes (Signed)
 "  [facility] Progress Note   Subjective:   Seen in room - for HD today, then d/c to SNF per notes. He denies CP/dyspnea today.  Objective Vitals:   05/29/24 2340 05/30/24 0438 05/30/24 0806 05/30/24 1120  BP: (!) 111/98 (!) 152/49 (!) 142/53 (!) 136/43  Pulse: 68 65 69 62  Resp: 18 18 18 18   Temp: 97.6 F (36.4 C) 97.7 F (36.5 C) 97.7 F (36.5 C) 98.3 F (36.8 C)  TempSrc: Oral Oral Oral Oral  SpO2: 99% 98% 99% 99%  Weight:  90 kg    Height:       Physical Exam General: Well appearing, NAD. Room air Heart: RRR Lungs: CTA anteriorly Abdomen: soft Extremities: no LE edema Dialysis Access:  TDC + LUE AVF  Additional Objective Labs: Basic Metabolic Panel: Recent Labs  Lab 05/28/24 0433 05/29/24 0513 05/30/24 0429  NA 137 139 141  K 4.6 5.0 5.5*  CL 98 100 102  CO2 30 29 28   GLUCOSE 86 97 100*  BUN 31* 54* 71*  CREATININE 4.85* 6.79* 7.95*  CALCIUM  9.6 9.7 9.9  PHOS 3.8 4.3 5.3*   Liver Function Tests: Recent Labs  Lab 05/28/24 0433 05/29/24 0513 05/30/24 0429  ALBUMIN 3.6 3.5 3.4*   CBC: Recent Labs  Lab 05/24/24 0604 05/25/24 0606 05/26/24 0412 05/27/24 0112 05/28/24 0433 05/29/24 0513 05/30/24 0429  WBC 6.1 6.1 5.2 4.5 4.6 4.7 4.7  NEUTROABS 3.0 2.8  --   --   --   --   --   HGB 9.5* 9.0* 8.8* 8.7* 9.8* 9.3* 9.1*  HCT 30.0* 28.0* 27.3* 26.5* 30.1* 29.6* 29.1*  MCV 102.4* 101.1* 100.4* 99.6 99.7 102.4* 102.1*  PLT 93* 92* 93* 97* 106* 105* 107*   Medications:   allopurinol   100 mg Oral Daily   apixaban   10 mg Oral BID   Followed by   NOREEN ON 06/03/2024] apixaban   5 mg Oral BID   atorvastatin   40 mg Oral Daily   calcitRIOL   0.25 mcg Oral Once per day on Monday Wednesday Friday   Chlorhexidine  Gluconate Cloth  6 each Topical Q0600   Chlorhexidine  Gluconate Cloth  6 each Topical Q0600   cholecalciferol   5,000 Units Oral Daily   darbepoetin (ARANESP ) injection - DIALYSIS  40 mcg Subcutaneous Q Fri-1800   feeding supplement  (NEPRO CARB STEADY)  237 mL Oral BID BM   ferrous sulfate   325 mg Oral Daily   insulin  aspart  0-6 Units Subcutaneous TID WC   melatonin  5 mg Oral QHS   multivitamin with minerals  1 tablet Oral Daily   senna-docusate  1 tablet Oral QHS   sodium chloride  flush  3 mL Intravenous Q12H    Dialysis Orders TTS - [facility] (1st HD 12/6 at [facility], only 3 outpatient HD so far) 4hr, 400/600, EDW 91kg, 2K/2Ca, TDC + AVF, heparin  2500 unit bolus - Getting course of Venofer - Calcitriol  0.25mcg PO q HD   Assessment/Plan:  Syncopal event at HD: Improved with IVF, likely hypotensive in nature. BP meds reduced as below.   Elevated troponin: Felt not ACS by cardiology  New seizure-like activity - AM 12/30: MRI without acute findings, EEG without seizure.   ESRD: Back to usual TTS schedule -- for HD shortly  Hypertension/volume: Amlodipine /MTP stopped. Was on midodrine  temporarily, has now been stopped.  Anemia: Hgb 9-10 range - continue Aranesp  40mcg q Friday this admit, on PO iron.  Metabolic bone disease: Ca/Phos ok, not on  binders yet. Continue renal diet.   R internal jugular DVT: Acute DVT, near his TDC. Used AVF 1/3, if able to use again next HD can likely remove TDC. Now transitioned off IV heparin  to Eliquis .  Dispo: Ok from renal standpoint after HD today. Appears will be going to SNF/[facility].   Izetta Boehringer, PA-C 05/30/2024, 12:58 PM  [facility]    "

## 2024-05-30 NOTE — Progress Notes (Signed)
 1712 Attempted to call report to [facility] SNF @ [phone]. No answer, and no voicemail set up. Will continue to try to call.   1815 Attempted to call [facility] again to give report. Reached a representative and was transferred with 2 attempts, but no answer for about 5 min each.   1820 [facility] arrived to pick up patient. All discharge paperwork including AVS and discharge summary, and number to call with any questions.

## 2024-05-30 NOTE — Plan of Care (Signed)

## 2024-05-30 NOTE — Progress Notes (Signed)
" °   05/30/24 1654  Vitals  Temp 97.9 F (36.6 C)  Pulse Rate 62  Resp (!) 22  BP (!) 151/58  SpO2 100 %  O2 Device Room Air  Weight 92.5 kg  Type of Weight Post-Dialysis  Oxygen Therapy  Patient Activity (if Appropriate) In bed  Pulse Oximetry Type Continuous  Oximetry Probe Site Changed No  Post Treatment  Dialyzer Clearance Lightly streaked  Hemodialysis Intake (mL) 0 mL  Liters Processed 54  Fluid Removed (mL) 1000 mL  Tolerated HD Treatment Yes  AVG/AVF Arterial Site Held (minutes) 10 minutes  AVG/AVF Venous Site Held (minutes) 10 minutes   Received patient in bed to unit.  Alert and oriented.  Informed consent signed and in chart.   TX duration:3.0  Patient tolerated well.  Transported back to the room  Alert, without acute distress.  Hand-off given to patient's nurse.   Access used: LUAF Access issues: no complications  Total UF removed: 1000 Medication(s) given: none [facility] "

## 2024-06-08 ENCOUNTER — Other Ambulatory Visit (HOSPITAL_COMMUNITY): Payer: Self-pay | Admitting: Nephrology

## 2024-06-08 ENCOUNTER — Telehealth (HOSPITAL_COMMUNITY): Payer: Self-pay

## 2024-06-08 DIAGNOSIS — Z992 Dependence on renal dialysis: Secondary | ICD-10-CM

## 2024-06-08 NOTE — Telephone Encounter (Signed)
LMOM for pt to callback for scheduling

## 2024-06-09 ENCOUNTER — Encounter (HOSPITAL_COMMUNITY)

## 2024-06-12 ENCOUNTER — Telehealth (HOSPITAL_COMMUNITY): Payer: Self-pay

## 2024-06-12 NOTE — Telephone Encounter (Signed)
LMOM for pt to callback for scheduling

## 2024-06-13 ENCOUNTER — Telehealth (HOSPITAL_COMMUNITY): Payer: Self-pay

## 2024-06-13 NOTE — Telephone Encounter (Signed)
LMOM for pt to callback for scheduling

## 2024-06-14 ENCOUNTER — Telehealth (HOSPITAL_COMMUNITY): Payer: Self-pay

## 2024-06-14 NOTE — Telephone Encounter (Signed)
 LMOM for daughter to have pt to call back for scheduling. Pt VM is full could not leave a message.

## 2024-06-28 ENCOUNTER — Ambulatory Visit (HOSPITAL_COMMUNITY)
Admission: RE | Admit: 2024-06-28 | Discharge: 2024-06-28 | Disposition: A | Source: Ambulatory Visit | Attending: Nephrology | Admitting: Nephrology

## 2024-06-28 DIAGNOSIS — Z4901 Encounter for fitting and adjustment of extracorporeal dialysis catheter: Secondary | ICD-10-CM | POA: Insufficient documentation

## 2024-06-28 DIAGNOSIS — Z992 Dependence on renal dialysis: Secondary | ICD-10-CM

## 2024-06-28 DIAGNOSIS — N186 End stage renal disease: Secondary | ICD-10-CM | POA: Insufficient documentation

## 2024-06-28 MED ORDER — LIDOCAINE-EPINEPHRINE 1 %-1:100000 IJ SOLN
INTRAMUSCULAR | Status: AC
  Filled 2024-06-28: qty 20

## 2024-06-28 MED ORDER — LIDOCAINE-EPINEPHRINE 1 %-1:100000 IJ SOLN
20.0000 mL | Freq: Once | INTRAMUSCULAR | Status: AC
  Administered 2024-06-28: 10 mL via INTRADERMAL

## 2024-06-28 NOTE — Procedures (Signed)
 Interventional Radiology Procedure Note  PROCEDURE SUMMARY:  Successful removal of tunneled catheter.  No complications.   EBL = trace  Please see full dictation in imaging section of Epic for procedure details.  Kelly Dunn Railyn House 06/28/24 1350

## 2024-06-30 ENCOUNTER — Encounter (HOSPITAL_COMMUNITY)
